# Patient Record
Sex: Female | Born: 1955
Health system: Southern US, Community
[De-identification: ages and names within clinical notes are randomized; demographics above are authoritative.]

## PROBLEM LIST (undated history)

## (undated) DIAGNOSIS — E039 Hypothyroidism, unspecified: Secondary | ICD-10-CM

## (undated) DIAGNOSIS — G43909 Migraine, unspecified, not intractable, without status migrainosus: Secondary | ICD-10-CM

## (undated) DIAGNOSIS — K859 Acute pancreatitis without necrosis or infection, unspecified: Secondary | ICD-10-CM

## (undated) DIAGNOSIS — J45909 Unspecified asthma, uncomplicated: Secondary | ICD-10-CM

## (undated) DIAGNOSIS — G20A1 Parkinson's disease without dyskinesia, without mention of fluctuations: Secondary | ICD-10-CM

## (undated) DIAGNOSIS — R296 Repeated falls: Secondary | ICD-10-CM

## (undated) DIAGNOSIS — E7800 Pure hypercholesterolemia, unspecified: Secondary | ICD-10-CM

## (undated) DIAGNOSIS — K219 Gastro-esophageal reflux disease without esophagitis: Secondary | ICD-10-CM

## (undated) HISTORY — PX: GALLBLADDER SURGERY: SHX652

## (undated) HISTORY — DX: Parkinson's disease without dyskinesia, without mention of fluctuations: G20.A1

## (undated) HISTORY — DX: Unspecified asthma, uncomplicated: J45.909

## (undated) HISTORY — PX: FOOT SURGERY: SHX648

## (undated) HISTORY — PX: ABDOMINAL HYSTERECTOMY: SHX81

## (undated) HISTORY — DX: Gastro-esophageal reflux disease without esophagitis: K21.9

## (undated) HISTORY — DX: Pure hypercholesterolemia, unspecified: E78.00

---

## 1999-06-17 ENCOUNTER — Ambulatory Visit (HOSPITAL_COMMUNITY): Admission: RE | Admit: 1999-06-17 | Discharge: 1999-06-17 | Payer: Self-pay | Admitting: Obstetrics and Gynecology

## 2000-05-24 ENCOUNTER — Inpatient Hospital Stay (HOSPITAL_COMMUNITY): Admission: RE | Admit: 2000-05-24 | Discharge: 2000-05-26 | Payer: Self-pay | Admitting: Obstetrics and Gynecology

## 2000-05-24 ENCOUNTER — Encounter (INDEPENDENT_AMBULATORY_CARE_PROVIDER_SITE_OTHER): Payer: Self-pay | Admitting: Specialist

## 2001-02-03 ENCOUNTER — Encounter: Payer: Self-pay | Admitting: *Deleted

## 2001-02-03 ENCOUNTER — Emergency Department (HOSPITAL_COMMUNITY): Admission: EM | Admit: 2001-02-03 | Discharge: 2001-02-03 | Payer: Self-pay | Admitting: *Deleted

## 2002-06-03 ENCOUNTER — Ambulatory Visit (HOSPITAL_COMMUNITY): Admission: RE | Admit: 2002-06-03 | Discharge: 2002-06-03 | Payer: Self-pay | Admitting: Family Medicine

## 2002-06-03 ENCOUNTER — Encounter: Payer: Self-pay | Admitting: Family Medicine

## 2002-06-06 ENCOUNTER — Encounter (HOSPITAL_COMMUNITY): Admission: RE | Admit: 2002-06-06 | Discharge: 2002-07-06 | Payer: Self-pay | Admitting: Family Medicine

## 2002-06-06 ENCOUNTER — Encounter: Payer: Self-pay | Admitting: Family Medicine

## 2002-06-20 ENCOUNTER — Ambulatory Visit (HOSPITAL_COMMUNITY): Admission: RE | Admit: 2002-06-20 | Discharge: 2002-06-20 | Payer: Self-pay | Admitting: Internal Medicine

## 2002-06-20 ENCOUNTER — Encounter: Payer: Self-pay | Admitting: Internal Medicine

## 2002-08-09 ENCOUNTER — Ambulatory Visit (HOSPITAL_COMMUNITY): Admission: RE | Admit: 2002-08-09 | Discharge: 2002-08-09 | Payer: Self-pay | Admitting: General Surgery

## 2003-01-31 ENCOUNTER — Ambulatory Visit (HOSPITAL_COMMUNITY): Admission: RE | Admit: 2003-01-31 | Discharge: 2003-01-31 | Payer: Self-pay | Admitting: Family Medicine

## 2003-01-31 ENCOUNTER — Encounter: Payer: Self-pay | Admitting: Family Medicine

## 2003-07-29 ENCOUNTER — Ambulatory Visit (HOSPITAL_COMMUNITY): Admission: RE | Admit: 2003-07-29 | Discharge: 2003-07-29 | Payer: Self-pay | Admitting: Family Medicine

## 2004-06-03 ENCOUNTER — Ambulatory Visit (HOSPITAL_COMMUNITY): Admission: RE | Admit: 2004-06-03 | Discharge: 2004-06-03 | Payer: Self-pay | Admitting: Internal Medicine

## 2004-07-07 ENCOUNTER — Ambulatory Visit: Payer: Self-pay | Admitting: Orthopedic Surgery

## 2004-08-02 ENCOUNTER — Ambulatory Visit: Payer: Self-pay | Admitting: Orthopedic Surgery

## 2006-09-12 ENCOUNTER — Ambulatory Visit (HOSPITAL_COMMUNITY): Admission: RE | Admit: 2006-09-12 | Discharge: 2006-09-12 | Payer: Self-pay | Admitting: Family Medicine

## 2007-09-06 DIAGNOSIS — K859 Acute pancreatitis without necrosis or infection, unspecified: Secondary | ICD-10-CM

## 2007-09-06 HISTORY — DX: Acute pancreatitis without necrosis or infection, unspecified: K85.90

## 2007-09-19 ENCOUNTER — Ambulatory Visit (HOSPITAL_COMMUNITY): Admission: RE | Admit: 2007-09-19 | Discharge: 2007-09-19 | Payer: Self-pay | Admitting: Internal Medicine

## 2007-10-25 ENCOUNTER — Inpatient Hospital Stay (HOSPITAL_COMMUNITY): Admission: EM | Admit: 2007-10-25 | Discharge: 2007-11-02 | Payer: Self-pay | Admitting: Emergency Medicine

## 2007-10-26 ENCOUNTER — Ambulatory Visit: Payer: Self-pay | Admitting: Gastroenterology

## 2007-10-29 ENCOUNTER — Ambulatory Visit: Payer: Self-pay | Admitting: Internal Medicine

## 2007-10-31 ENCOUNTER — Ambulatory Visit: Payer: Self-pay | Admitting: Gastroenterology

## 2007-11-09 ENCOUNTER — Emergency Department (HOSPITAL_COMMUNITY): Admission: EM | Admit: 2007-11-09 | Discharge: 2007-11-09 | Payer: Self-pay | Admitting: Emergency Medicine

## 2007-12-05 ENCOUNTER — Ambulatory Visit: Payer: Self-pay | Admitting: Gastroenterology

## 2007-12-06 ENCOUNTER — Ambulatory Visit (HOSPITAL_COMMUNITY): Admission: RE | Admit: 2007-12-06 | Discharge: 2007-12-06 | Payer: Self-pay | Admitting: Gastroenterology

## 2008-02-13 ENCOUNTER — Ambulatory Visit: Payer: Self-pay | Admitting: Gastroenterology

## 2008-08-19 ENCOUNTER — Ambulatory Visit: Payer: Self-pay | Admitting: Internal Medicine

## 2009-03-18 DIAGNOSIS — F329 Major depressive disorder, single episode, unspecified: Secondary | ICD-10-CM

## 2009-03-18 DIAGNOSIS — K8020 Calculus of gallbladder without cholecystitis without obstruction: Secondary | ICD-10-CM | POA: Insufficient documentation

## 2009-03-18 DIAGNOSIS — N390 Urinary tract infection, site not specified: Secondary | ICD-10-CM | POA: Insufficient documentation

## 2009-03-18 DIAGNOSIS — F411 Generalized anxiety disorder: Secondary | ICD-10-CM | POA: Insufficient documentation

## 2009-03-18 DIAGNOSIS — K7689 Other specified diseases of liver: Secondary | ICD-10-CM | POA: Insufficient documentation

## 2009-03-18 DIAGNOSIS — K5900 Constipation, unspecified: Secondary | ICD-10-CM | POA: Insufficient documentation

## 2009-03-18 DIAGNOSIS — K219 Gastro-esophageal reflux disease without esophagitis: Secondary | ICD-10-CM | POA: Insufficient documentation

## 2009-03-18 DIAGNOSIS — K859 Acute pancreatitis without necrosis or infection, unspecified: Secondary | ICD-10-CM | POA: Insufficient documentation

## 2009-03-19 ENCOUNTER — Ambulatory Visit: Payer: Self-pay | Admitting: Gastroenterology

## 2009-10-13 ENCOUNTER — Ambulatory Visit: Payer: Self-pay | Admitting: Gastroenterology

## 2010-01-08 ENCOUNTER — Ambulatory Visit (HOSPITAL_COMMUNITY): Admission: RE | Admit: 2010-01-08 | Discharge: 2010-01-08 | Payer: Self-pay | Admitting: Internal Medicine

## 2010-01-20 ENCOUNTER — Ambulatory Visit (HOSPITAL_COMMUNITY): Admission: RE | Admit: 2010-01-20 | Discharge: 2010-01-20 | Payer: Self-pay | Admitting: Internal Medicine

## 2010-01-27 ENCOUNTER — Ambulatory Visit: Payer: Self-pay | Admitting: Gastroenterology

## 2010-02-04 ENCOUNTER — Encounter: Payer: Self-pay | Admitting: Gastroenterology

## 2010-03-25 ENCOUNTER — Telehealth (INDEPENDENT_AMBULATORY_CARE_PROVIDER_SITE_OTHER): Payer: Self-pay

## 2010-04-14 ENCOUNTER — Telehealth (INDEPENDENT_AMBULATORY_CARE_PROVIDER_SITE_OTHER): Payer: Self-pay

## 2010-05-14 ENCOUNTER — Ambulatory Visit (HOSPITAL_COMMUNITY): Admission: RE | Admit: 2010-05-14 | Discharge: 2010-05-14 | Payer: Self-pay | Admitting: Internal Medicine

## 2010-05-15 ENCOUNTER — Ambulatory Visit (HOSPITAL_COMMUNITY): Admission: RE | Admit: 2010-05-15 | Discharge: 2010-05-15 | Payer: Self-pay | Admitting: Internal Medicine

## 2010-06-24 ENCOUNTER — Encounter (INDEPENDENT_AMBULATORY_CARE_PROVIDER_SITE_OTHER): Payer: Self-pay | Admitting: *Deleted

## 2010-10-05 NOTE — Progress Notes (Signed)
Summary: phone note  Phone Note Call from Patient   Caller: Patient Summary of Call: Pt called and asked if Dr. Darrick Penna could recommend an OTC medication for sinus problems, that will not  cause problems to her pancreas. I told her that we do not prescribe meds for sinus infections, and she  said she just wanted to know what would be safe to take OTC that would not hurt her.  Initial call taken by: Cloria Spring LPN,  April 14, 2010 3:24 PM     Appended Document: phone note Please call pt. She may take OTC Claritin.  Appended Document: phone note Pt informed.

## 2010-10-05 NOTE — Medication Information (Signed)
Summary: PA for dexilant  PA for dexilant   Imported By: Hendricks Limes LPN 51/88/4166 06:30:16  _____________________________________________________________________  External Attachment:    Type:   Image     Comment:   External Document

## 2010-10-05 NOTE — Progress Notes (Signed)
  Phone Note Call from Patient Call back at Home Phone [phone]   Caller: Patient Summary of Call: pt called- left voicemail- she doesnt have the money for visit with her pcp and  she has a sinus infection and wants to know if slf will call in something for her to [facility] please advise Initial call taken by: Hendricks Limes LPN,  March 25, 2010 1:11 PM     Appended Document:  Please call pt. She needs to be seen and evaluated before receiving Abx. She should try the health dept.  Appended Document:  pt aware

## 2010-10-05 NOTE — Letter (Signed)
Summary: Recall Office Visit  [facility]  [address]   [place], [place] [address]   Phone: [phone]  Fax: [phone]      June 24, 2010   [name] [address] [place], [place]  [address] [birth year]   Dear Ms. Hrdlicka,   According to our records, it is time for you to schedule a follow-up office visit with [place].   At your convenience, please call [phone] to schedule an office visit. If you have any questions, concerns, or feel that this letter is in error, we would appreciate your call.   Sincerely,    Rosine Beat  [facility] Ph: [phone]   Fax: [phone]

## 2010-10-05 NOTE — Assessment & Plan Note (Signed)
Summary: GERD,CONSTIPATION   Visit Type:  Follow-up Visit Primary Care Provider:  Sherwood Gambler, M.D.  Chief Complaint:  Gerd/constipation.  History of Present Illness: Had a know on a mammogram. Several people prayed and the second mammogram. No questions or concerns. Needs Nexium samples. BMs: 1-2x/day. Heartburn and indigestion is controlled. Takes omeprazole if out of Nexium beacuse it's expensive.  Current Medications (verified): 1)  Tylenol Ex St Arthritis Pain 500 Mg Tabs (Acetaminophen) .... As Needed 2)  Tylenol 325 Mg Tabs (Acetaminophen) .... As Needed 3)  Xanax 1 Mg Tabs (Alprazolam) .... Four Times A Day As Needed 4)  Fiber Therapy Caplets .... As Needed 5)  Lasix 20 Mg Tabs (Furosemide) .... Once Daily 6)  Zoloft 100 Mg Tabs (Sertraline Hcl) .... Once Daily 7)  Allopurinol 300 Mg Tabs (Allopurinol) .... Once Daily 8)  Celexa 20 Mg Tabs (Citalopram Hydrobromide) .... Once Daily 9)  Diclofenac Sodium 50 Mg Tbec (Diclofenac Sodium) .Marland Kitchen.. 1 By Mouth Q6-8h As Needed For Neck, Arm, or Back Pain 10)  Omeprazole 20 Mg Cpdr (Omeprazole) .... Take 1 Tablet By Mouth Once A Day 11)  Pravachol 40 Mg Tabs (Pravastatin Sodium) .... Take 1 Tablet By Mouth Once A Day 12)  Niacin 50 Mg Tabs (Niacin) .... Take 1 Tablet By Mouth Once A Day 13)  Fioricet 50-325-40 Mg Tabs (Butalbital-Apap-Caffeine) .... As Needed 14)  Neurontin 100 Mg Caps (Gabapentin) .... Two Capsules Three Times Daily 15)  Vicodin 5-500 Mg Tabs (Hydrocodone-Acetaminophen) .... As Needed  Allergies (verified): 1)  ! Sulfa 2)  ! Penicillin 3)  ! Tetracycline  Past History:  Past Medical History: Pancreatitis with pseudocyst formation, idiopathic 2009 Anxiety Disorder Depression Fatty Liver Disease GERD Urinary Tract Infection 2009 Acute Disseminated Encephalomyelitis 2009 Chronic Constipation 2008: TCS [facility] NUR-WNLS  Vital Signs:  Patient profile:   55 year old female Height:      66 inches Weight:      164  pounds BMI:     26.57 Temp:     98.8 degrees F oral Pulse rate:   84 / minute BP sitting:   110 / 80  (left arm) Cuff size:   regular  Vitals Entered By: Cloria Spring LPN (Jan 27, 2010 4:15 PM)  Physical Exam  General:  Well developed, well nourished, no acute distress. Head:  Normocephalic and atraumatic. Lungs:  Clear throughout to auscultation. Heart:  Regular rate and rhythm; no murmurs. Abdomen:  Soft, MILD TTP R PERIUMBILICAL REGION, nondistended. Normal bowel sounds. Extremities:  No edema or deformities noted. BREAST CANCER TATTOO on RLE  Impression & Recommendations:  Problem # 1:  GERD (ICD-530.81) Assessment Improved  Unable to afford Nexium, #30 given. Pt may try Dexilant for better Sx control and less expense. #5 samples and Rx copay card given. OPV in 6 mos.  Orders: Est. Patient Level II ([address])  Problem # 2:  CONSTIPATION (ICD-564.00) Assessment: Comment Only  Continue current regimen.   CC: PCP  Orders: Est. Patient Level II (60454) Prescriptions: DEXILANT 60 MG CPDR (DEXLANSOPRAZOLE) 1 by mouth every morning  #30 x 5   Entered and Authorized by:   West Bali MD   Signed by:   West Bali MD on 01/27/2010   Method used:   Electronically to        [facility]  [address] (retail)       [address]       [place], [place]  [id number]       Ph: [phone]       Fax: [phone]   RxID:   [id number]   Appended Document: GERD,CONSTIPATION REMINDER IN COMPUTER

## 2010-10-05 NOTE — Assessment & Plan Note (Signed)
Summary: GERD,CONSTIPATION   Visit Type:  Follow-up Visit Primary Care Provider:  Phillips Odor, M.D.  Chief Complaint:  follow up- doing ok.  History of Present Illness: Keeping a sinus ifxn. Last week had virus and diarrhea. Has a BM daily and happening on its own.  Fiber pills rarely. Nexium helps. It is expensive ( ~$103/mo.) so use OMP if doesn't use Nexium. OMP only once a day doesn't help as well as Nexium once daily.  Current Medications (verified): 1)  Albuterol Sulfate 2 Mg Tabs (Albuterol Sulfate) .... As Needed 2)  Tylenol Ex St Arthritis Pain 500 Mg Tabs (Acetaminophen) .... As Needed 3)  Tylenol 325 Mg Tabs (Acetaminophen) .... As Needed 4)  Xanax 1 Mg Tabs (Alprazolam) .... Four Times A Day As Needed 5)  Ferrousul 325 (65 Fe) Mg Tabs (Ferrous Sulfate) .... Two Times A Day 6)  Fiber Therapy Caplets .... As Needed 7)  Lasix 20 Mg Tabs (Furosemide) .... Once Daily 8)  Zoloft 100 Mg Tabs (Sertraline Hcl) .... Once Daily 9)  Allopurinol 300 Mg Tabs (Allopurinol) .... Once Daily 10)  Nexium 20 Mg Cpdr (Esomeprazole Magnesium) .... Once Daily 11)  Celexa 20 Mg Tabs (Citalopram Hydrobromide) .... Once Daily  Allergies (verified): 1)  ! Sulfa 2)  ! Penicillin 3)  ! Tetracycline  Past History:  Past Medical History: Pancreatitis with pseudocyst formation, idiopathic 2009 Anxiety Disorder Depression Fatty Liver Disease GERD Urinary Tract Infection 2009 Acute Disseminated Encephalomyelitis 2009 Chronic Constipation 2008: [facility] NUR-[facility]  Vital Signs:  Patient profile:   55 year old female Height:      66 inches Weight:      170 pounds BMI:     27.54 Temp:     98.1 degrees F oral Pulse rate:   60 / minute BP sitting:   104 / 60  (left arm) Cuff size:   regular  Vitals Entered By: Hendricks Limes LPN (October 13, 2009 4:24 PM)  Physical Exam  General:  Well developed, well nourished, no acute distress. Head:  Normocephalic and atraumatic. Lungs:  Clear throughout to  auscultation. Heart:  Regular rate and rhythm; no murmurs Abdomen:  Soft, nontender and nondistended. Normal bowel sounds. Psych:  Alert and cooperative. Normal mood and affect.  Impression & Recommendations:  Problem # 1:  CONSTIPATION (ICD-564.00) Assessment Improved NO NEW RECS. TCS IN 2018. OPV IN 3 MOS.  Problem # 2:  GERD (ICD-530.81) Assessment: Improved  USE Nexium or OMP 1-2x daily. Use Diclofenac. Med warning given: GIB, and fluid retention. Nexium #20 given.  CC: PCP  Orders: Est. Patient Level III (09811)  Patient Instructions: 1)  Take Dicolfenac with food or milk. May cause you to retain fluid. 2)  Use Nexium or omeprazole GERD 3)  Return visit in 3 mos. 4)  The medication list was reviewed and reconciled.  All changed / newly prescribed medications were explained.  A complete medication list was provided to the patient / caregiver. Prescriptions: DICLOFENAC SODIUM 50 MG TBEC (DICLOFENAC SODIUM) 1 by mouth q6-8h as needed for neck, arm, or back pain  #30 x 5   Entered and Authorized by:   West Bali MD   Signed by:   West Bali MD on 10/13/2009   Method used:   Print then Give to Patient   RxID:   [id number]

## 2011-01-18 NOTE — Consult Note (Signed)
NAME:  [name], [name]             ACCOUNT NO.:  [id number]   MEDICAL RECORD NO.:  [id number]          PATIENT TYPE:  INP   LOCATION:  A207                          FACILITY:  [facility]   PHYSICIAN:  Kofi A. Gerilyn Pilgrim, M.D. DATE OF BIRTH:  10-23-1955   DATE OF CONSULTATION:  10/30/2007  DATE OF DISCHARGE:                                 CONSULTATION   REASON FOR CONSULTATION:  Altered mental status.   HISTORY OF PRESENT ILLNESS:  The patient is a 55 year old lady who  presents to the hospital with generalized weakness.  The patient had a  significant history of nausea and vomiting.  The patient is in the  process of being worked up for pancreatitis and urinary tract infection.  She was noted to have some alteration of mentation and hence the  neurological consultation.  She does have apparently a long history of  episodic headaches that are sometimes associated with nausea and  vomiting.  The patient reports apparently losing about 3 pounds over the  last several weeks due to nausea and vomiting.  She also has significant  epigastric pain.   PAST MEDICAL HISTORY:  Episodic headaches, pancreatitis, anxiety  disorder, depression, and respiratory distress.   PAST SURGICAL HISTORY:  Carpal tunnel release procedure, hysterectomy,  cholecystectomy, and endometriosis ablation.   SOCIAL HISTORY:  She is a nonsmoker.  No alcohol or illicit drug use.  She is currently married.   ALLERGIES:  TETRACYCLINE, PENICILLIN, and SULFA.   ADMISSION MEDICATIONS:  1. Bupropion 100 mg b.i.d.  2. Clonazepam 2 mg p.r.n.  3. Omeprazole 20 mg p.r.n.  4. Celexa 20 mg daily.  5. Albuterol.   CURRENT MEDICATIONS:  1. Celexa.  2. Wellbutrin.  3. Lovenox.  4. Protonix.  5. Amylase combination with lipase.  6. Clonazepam.  7. Phenergan p.r.n.  8. Xopenex p.r.n.   PHYSICAL EXAMINATION:  GENERAL:  A thin lady in no acute distress.  VITAL SIGNS:  Temperature 97.8, pulse 100, respirations 18, blood  pressure 112/60.  HEENT:  Neck is supple.  Head is normocephalic and atraumatic.  ABDOMEN:  Soft.  EXTREMITIES:  No significant edema.  NEUROLOGY:  The patient is awake and alert.  She converses well.  She is  oriented to person and place.  She is oriented to hospital.  She does  not recognize me although I have seen her in the past in the outpatient  setting.  She follows commands well and there is no difficulty with  comprehension or aphasia.  CRANIAL NERVES:  Pupils are 5 mm and reactive.  They both are brisk.  She has normal extraocular movements.  Visual fields are intact.  Facial  muscle strength is symmetric.  Tongue is midline.  Uvula is midline.  Shoulder shrug is normal.  MOTOR:  Mild weakness involving the left leg, but 4+.  The other  extremities show normal tone, bulk, and strength.  I see no pronator  drift.  Coordination shows normal finger-to-nose.  There is no evidence  of dysmetria or past pointing.  There is no parkinsonism.  Reflexes are  pathologically brisk throughout 3+, although plantar  reflexes are both  flexor.  Sensation is normal light touch and temperature.  Gait was  wide, somewhat unsteady, but is not spastic.   LABORATORY DATA:  The patient had a CT scan done about a month ago which  essentially was normal.  She has a repeat scan which appears normal, but  by my reading.  The radiologist raised the question of possible  increased edema and suggested MRI may be better.   Sodium 139, potassium 3.5, chloride 109, CO2 26, BUN 1, creatinine 0.5,  glucose 94, calcium 8.2.  WBC 5.4, hemoglobin 8.9, platelet count 164.  Urinalysis positive for nitrites, turbid, WBC 28-50, RBC 0-2, many  bacteria.  Culture did grow out Proteus mirabilis.  Blood culture is  negative.   IMPRESSION:  1. Mild encephalopathy likely due to toxic metabolic reasons from      urinary tract infection.  2. Episodic headaches likely due to primary headaches such as      migraines.  3.  Questionable abnormal CT findings.  4. Brisk reflexes, unclear etiology.  We will observe for now.   RECOMMENDATIONS:  1. MRI of the brain to assess the potential findings on CT.  2. Additional blood testing for the following:  RPR, TSH, homocysteine      level, B12 level, ANA, and sed rate.   Thanks for this consultation.      Kofi A. Gerilyn Pilgrim, M.D.  Electronically Signed     KAD/MEDQ  D:  10/30/2007  T:  10/30/2007  Job:  [id number]

## 2011-01-18 NOTE — Group Therapy Note (Signed)
[name], [name]             ACCOUNT NO.:  [id number]   MEDICAL RECORD NO.:  [id number]          PATIENT TYPE:  INP   LOCATION:  A207                          FACILITY:  [facility]   PHYSICIAN:  Skeet Latch, DO    DATE OF BIRTH:  11/30/1955   DATE OF PROCEDURE:  10/28/2007  DATE OF DISCHARGE:                                 PROGRESS NOTE   SUBJECTIVE:  [name] states her abdominal pain continues to improve  as well as her nausea and vomiting.  Overall, the patient states that  she wants to go home.   OBJECTIVE:  VITAL SIGNS:  Temperature is 97.5, pulse 93, respirations  22, blood pressure 122/78.  CARDIOVASCULAR:  Regular rate and rhythm.  No rubs, gallops, or murmurs.  LUNGS:  Clear to auscultation bilaterally.  No rales, rhonchi, or  wheezing.  ABDOMEN:  Soft, nontender, nondistended.  Positive bowel sounds.  No  rigidity or guarding.  EXTREMITIES:  No clubbing, cyanosis, or edema.  NEUROLOGIC:  The patient's affect seems to be a little bit improved  today.  She is alert and oriented and awake.   LABORATORY:  Fecal occult negative.  Lipase is 82.  Sodium 145,  potassium 3.1, chloride is 101, CO2 is 27, glucose 101, BUN 1,  creatinine 0.68.  AST 16, ALT 10.  Total protein 5.8.  Albumin 2.6.  White count 6.4, hemoglobin 9.7, hematocrit 28.8, platelets 181.   ASSESSMENT/PLAN:  1. Probable pyelonephritis.  The patient continues to be on      intravenous antibiotics and at this point we will decrease her      intravenous rate.  2. History of pancreatitis.  Lipase is now in a normal range.  She      does have peripancreatic cystic fluid collection on her computed      tomography scan.  3. Anemia, probably dilutional.  We will continue to check her      hemoglobin and hematocrit.  Her hemoccult stool has been negative.  4. Hypokalemia.  We will continue to monitor and continue to replace.   I anticipate the patient being discharged in the next 1-2 days if   stable.      Skeet Latch, DO  Electronically Signed     SM/MEDQ  D:  10/28/2007  T:  10/28/2007  Job:  [id number]

## 2011-01-18 NOTE — Consult Note (Signed)
NAMEMILIYAH, [name]             ACCOUNT NO.:  [id number]   MEDICAL RECORD NO.:  [id number]          PATIENT TYPE:  INP   LOCATION:  A207                          FACILITY:  [facility]   PHYSICIAN:  Kassie [name], M.D.      DATE OF BIRTH:  1955-10-12   DATE OF CONSULTATION:  10/25/2007  DATE OF DISCHARGE:                                 CONSULTATION   REFERRING PHYSICIAN:  Dorris Singh, DO   DATE OF CONSULTATION:  10/26/07   REASON FOR CONSULTATION:  Abdominal pain.   HISTORY OF PRESENT ILLNESS:  Ms. Mcinroy is a 55 year old female who was  reportedly hospitalized at [facility] for 9 days with  pancreatitis.  History is limited because the patient is a poor  historian. She was discharged Valentine's day weekend.  She states she  continued to vomit and have abdominal pain.  She was also having  frequent urination and burning with urination.  She denied any blood in  her urine.  She denied any diarrhea or constipation.  She not have any  problems swallowing.  She reports having heartburn daily.  Her last  episode of vomiting was this morning, and she had no blood.  She did not  consume any alcohol.   PAST MEDICAL HISTORY:  1. Anxiety.  2. Depression.  3. Gastroesophageal reflux disease.  4. Fatty liver disease.   PAST SURGICAL HISTORY:  1..  Cholecystectomy in 2003 due to biliary  colic.  1. Hysterectomy with bilateral salpingo-oophorectomy.   ALLERGIES:  PENICILLIN, SULFA AND CLINDAMYCIN.   MEDICATIONS:  1. Wellbutrin.  2. Celexa  3. Colace  4. Lovenox.  5. Levaquin.  6. Protonix 40 mg q.12h.  7. Clonazepam as needed.  8. Xopenex.   FAMILY HISTORY:  She denies any family history of colon cancer or colon  polyps.   SOCIAL HISTORY:  She is married and does not smoke.  She is disabled  secondary to nerves.   REVIEW OF SYSTEMS:  She had a CT ultrasound and HIDA scan in 2003 which  were unremarkable except for constipation.  She reports having  colonoscopy  last year by Dr. Karilyn Cota.   REVIEW OF SYSTEMS:  Per the HPI, otherwise all systems are negative.  The HPI and review of systems is limited because the patient is a poor  historian.   PHYSICAL EXAMINATION:  VITAL SIGNS:  T-max 98.9, blood pressure 125/75,  O2 saturation 95% on room air.  GENERAL:  She is in no apparent distress, alert and oriented x4.  She  makes fairly poor eye contact.  HEENT:  Atraumatic, normocephalic.  Flat affect.  Pupils equal and  reactive to light.  Mouth with no oral lesions.  NECK:  Full range of motion.  No lymphadenopathy.  LUNGS:  Clear to auscultation bilaterally.  CARDIOVASCULAR:  Regular rhythm, no murmur.  ABDOMEN:  Bowel sounds are present.  Soft.  Mild tenderness to palpation  in the left upper quadrant without rebound or guarding.  EXTREMITIES:  No cyanosis or edema.  NEUROLOGIC:  She has no focal neurologic deficits.   LABORATORY DATA:  Hemoglobin was  12.7 on admission and decreased to 9.6,  white count 6.7.  Lipase 144, total bilirubin 1.7, AST 26, ALT 13,  albumin 3.5, creatinine 0.77.  UA was nitrite-positive, leukocytes  large, WBCs 20-50, bacteria many, with few squamous epithelial cells.   ASSESSMENT:  Ms. Carolan is a 55 year old female whose abdominal pain  and vomiting is most likely secondary to pyelonephritis.  She has a  mildly elevated lipase which is lot clinically significant.  However,  she has had an ultrasound which shows a peripancreatic cystic fluid  collection.  The differential diagnosis includes cystic mass versus  peripancreatic fluid collection secondary to prior pancreatitis.  Thank  you allowing me to see Ms. Ann Maki in consultation.  My recommendations  follow. She has a decrease in her hemoglobin not associated with active  GI bleeding.   RECOMMENDATIONS:  1. Obtain records with [facility].  2. She has no evidence of active bleeding, but would recheck her      hemoglobin and hematocrit as well as a type and  screen around 1800      today.  3. Will await the results of the CT scan.  4. She has no acute indication for endoscopy.      Kassie [name], M.D.  Electronically Signed     SM/MEDQ  D:  10/26/2007  T:  10/26/2007  Job:  [id number]

## 2011-01-18 NOTE — Assessment & Plan Note (Signed)
NAME:  [name], [name]              CHART#:  [id number]   DATE:  02/13/2008                       DOB:  [birth year]   PROBLEM LIST:  1. Pancreatitis with pseudocyst formation, unknown etiology for      pancreatitis.  2. Cholecystectomy in 2003, for biliary colic.  3. Anxiety.  4. Depression.  5. Gastroesophageal reflux disease.  6. Fatty liver disease.  7. Hospitalization for acute disseminated encephalomyelitis in      February 2009.  8. Urinary tract infection in February 2009.   SUBJECTIVE:  The patient is a 55 year old female who presents as a  return patient visit.  She was last seen in April 2009.  She was  complaining of constipation.  She denies any abdominal pain.  Her weight  has been stable.  She recently had a kidney infection.  She is still  taking omeprazole twice daily.  She feels she can tolerate once a day.  She is having 1-2 bowel movements daily, after adding Nutri-Grain Raisin  Bran.  She stopped taking the Citracal, and she has not had to use any  milk of magnesia.   MEDICATIONS:  1. Omeprazole 20 mg b.i.d.  2. Albuterol t.i.d.  3. Clonapam 2 mg t.i.d.  4. Bupropion t.i.d.  5. Citalopram 2 daily.  6. Tylenol as needed.  7. Tylenol Arthritis as needed  8. Zyrtec as needed   SUBJECTIVE:  VITAL SIGNS:  Weight 146 pounds, height 5 feet 6 inches,  temperature 98.9, blood pressure 110/78, and pulse 84.  GENERAL:  She is in no apparent distress.  Alert and oriented x4.LUNGS:  Clear to auscultation bilaterally.CARDIOVASCULAR:  Regular rate and  rhythm.  No murmur.  ABDOMEN:  Bowel sounds present.  Soft, nondistended, and mild tenderness  to palpation in the left lower quadrant.   ASSESSMENT:  The patient is a 55 year old female who had pancreatitis  with pseudocyst formation, which has now resolved.  She has  constipation, which has now resolved with fiber and water. Thank you  allowing me to see patient in consultation.  My recommendations follow.   RECOMMENDATIONS:  1. She should continue her high-fiber diet.  2. She may decrease her omeprazole to once daily.  3. She has no indication for a CT scan, since she is clinically      asymptomatic.  4. Follow up in 6 months.       Kassie [name], M.D.  Electronically Signed     SM/MEDQ  D:  02/13/2008  T:  02/14/2008  Job:  [id number]   cc:   Madelin Rear. Sherwood Gambler, MD

## 2011-01-18 NOTE — Discharge Summary (Signed)
NAMEHARMAN, [name]             ACCOUNT NO.:  [id number]   MEDICAL RECORD NO.:  [id number]          PATIENT TYPE:  INP   LOCATION:  A207                          FACILITY:  [facility]   PHYSICIAN:  Osvaldo Shipper, MD     DATE OF BIRTH:  1956/01/02   DATE OF ADMISSION:  10/25/2007  DATE OF DISCHARGE:  02/27/2009LH                               DISCHARGE SUMMARY   Please review H&P dictated by Dr. Dorris Singh for details regarding  the patient's presenting illness.   DISCHARGE DIAGNOSES:  1. Acute disseminated encephalomyelitis.  2. Pancreatitis with pseudocyst formation.  3. Urinary tract infection.  4. Anemia, possibly iron deficiency.  5. Hypokalemia and hypomagnesemia, improved.  6. History of depression.   BRIEF HOSPITAL COURSE:  PROBLEM #1 - PANCREATITIS:  This is a 55-year-  old Caucasian female who presented to the hospital with history of  nausea, vomiting and feeling weak.  The patient was evaluated in the  emergency department, where she was found to be severely hypokalemic  with a potassium of 2.5.  She had a lipase of 141 and she was found to  have a UTI.  The patient underwent abdominal ultrasound initially which  showed heterogeneous structure obscuring a portion of the pancreas and a  CT was recommended.  Subsequently, a CT of the abdomen was done, which  showed evidence for acute pancreatitis with multiple pseudocysts  involving the head and neck of the pancreas.  CT of the pelvis was done  subsequently, which did not show any acute process.  GI was consulted,  who recommended Creon 10 tablets for her; they also recommended  following up on CAT scan in about month's time and followup with GI.  Then her lipase levels were also followed and they improved and the last  checked level was 70.   PROBLEM #2 - ALTERED MENTAL STATUS:  Continued in the hospital; this  prompted a CAT scan of head which showed evidence for progressive white  matter disease and question was  raised about encephalitis, so an MRI was  done which showed diffuse profound abnormal cerebral white matter.  Changes were thought to be secondary to some kind of encephalitis.  Dr.  Gerilyn Pilgrim was consulted, who felt this was acute disseminated  encephalomyelitis.  He recommended 3 days of high-dose steroids, which  were given and today is the last day.  The patient's mental status has  improved during this hospital stay and she appears to be more alert and  oriented.   PROBLEM #3 - URINARY TRACT INFECTION:  She also was found to have a UTI  which grew E. coli and she was put on Levaquin and she has improved.   PROBLEM #4 - She also has hypokalemia and hypomagnesemia which were also  replaced.  She will be prescribed potassium chloride and magnesium oxide  to be taken at home.  Repeat lab tests will be done as an outpatient to  make sure her potassium levels have normalized.   PROBLEM #5 - She also was found to be anemic with a hemoglobin between 9  and 10.  Iron  profile studies were done yesterday which showed iron of  24, TIBC of 238, ferritin was 62 and percent saturation was 10%.  This  could be iron deficiency, although it is not very conclusive; however, I  will give her the benefit of doubt and start her on Nu-Iron, which she  can take at home.   On the day of discharge, the patient was noticed to be ambulating  somewhat with physical therapist.  Therapy has recommended a walker for  her.  She is complaining of some lower back pain which she has had for a  while; otherwise, she does not have any complaints to offer.  Her vital  signs are all stable, examination otherwise unremarkable.  She has  improved.  She is still a bit confused at times, but I think this can be  monitored at home.  She will need close followup with her PMD and with  GI and if her PMD thinks she needs further followup with Neurology, they  can definitely refer her for that.   DISCHARGE MEDICATIONS:  1.  Bupropion 100 mg twice daily.  2. Clonazepam 2 mg as needed.  3. Omeprazole 20 mg b.i.d.  4. Celexa 20 mg once a day.  5. She is also on Mobic at home, 7.5 mg, that she can take on a once      daily basis.   NEW MEDICATIONS:  1. Nu-Iron 150 mg p.o. daily for 1 week and then b.i.d.  2. Creon 10 two capsules 3 times daily with meals.  3. Levaquin 500 mg once daily for 3 days.  4. Potassium chloride 40 mEq p.o. daily.  5. Magnesium oxide 400 mg once daily.  6. Colace 100 mg p.o. b.i.d.   OTHER FOLLOWUP:  BMET to be checked next week by home health with the  results to Dr. Sherwood Gambler, H&H to be checked twice a week, results to the GI  folks, Dr. Cira Servant; this was their recommendation.   Follow up with Dr. Sherwood Gambler in 1 week, Dr. Cira Servant in 6 weeks, Dr. Sherwood Gambler to  decide if the patient needs to be followed up by Dr. Gerilyn Pilgrim as well or  not.   The patient may need to be seen by a mental health physician as well,  which I will defer to Dr. Sherwood Gambler.   DIET:  She can have a heart-healthy diet.   PHYSICAL ACTIVITY:  She needs to use a walker at home, which she does  have.   CONSULTATIONS:  Obtained from:  1. GI.  2. Neurology.   TOTAL TIME SPENT ON THIS DISCHARGE ENCOUNTER:  Forty minutes.   COMMENT:  Please note above is preliminary until signed.   ADDENDUM: Patient was discharged home after social worker and self  discussed home safety etc with her husband.      Osvaldo Shipper, MD  Electronically Signed     GK/MEDQ  D:  11/02/2007  T:  11/02/2007  Job:  [id number]   cc:   Madelin Rear. Sherwood Gambler, MD  Fax: [phone]   Kofi A. Gerilyn Pilgrim, M.D.  Fax: [phone]   Kassie [name], M.D.  [address]  [place] , [place] [address]

## 2011-01-18 NOTE — Assessment & Plan Note (Signed)
NAME:  [name], [name]              CHART#:  [id number]   DATE:  08/19/2008                       DOB:  1956-08-01   CHIEF COMPLAINT:  Followup visit.   PROBLEM LIST:  1. Pancreatitis with pseudocyst formation, unknown etiology for      pancreatitis.  2. Cholecystectomy in 2003, for biliary colic.  3. Anxiety.  4. Depression.  5. GERD.  6. Fatty liver disease.  7. Hospitalization for acute disseminated encephalomyelitis in      February 2009.  8. Urinary tract infection in February 2009.   SUBJECTIVE:  The patient is here for a followup visit.  She was last  seen in June 2009, by Dr. Cira Servant.  Other than constipation, she has been  doing very well.  Generally, her constipation is controlled with fiber  supplement.  Recently, she had multiple episodes of diarrhea after  eating out at a restaurant.  She took 2 Imodium and now she is having  trouble getting back on a regular bowel regimen.  She denies any blood  in the stool or melena.  Denies any abdominal pain.  Her appetite is  great.  No nausea, vomiting, or heartburn.   MEDICATIONS:  1. Omeprazole 20 mg b.i.d.  2. Mobic 7.5 mg daily.  3. Albuterol p.r.n.  4. Citalopram 20 mg twice daily.  5. Tylenol p.r.n.  6. Tylenol Arthritis p.r.n.  7. Xanax 1 mg q.i.d. p.r.n.  8. Ferrous sulfate 325 mg b.i.d.  9. Fiber therapy caplets p.r.n.   ALLERGIES:  Sulfa, penicillin, and tetracycline.   PHYSICAL EXAMINATION:  VITAL SIGNS:  Weight 164 pounds, up from 147  pounds June of this year.  Temp 97.7, blood pressure 130/78, and pulse  60.  GENERAL:  A pleasant, well-nourished, well-developed Caucasian female in  no acute distress.  SKIN:  Warm and dry.  No jaundice.  HEENT:  Sclerae nonicteric.  Oropharyngeal mucosa moist and pink.  ABDOMEN:  Positive bowel sounds.  Soft, nontender, and nondistended.  No  organomegaly or masses.  No rebound or guarding. No abdominal bruits or  hernia.  LOWER EXTREMITIES:  No edema.   IMPRESSION:   The patient is a 55 year old lady with history of  pancreatitis with pseudocyst formation, which is now resolved.  She had  a total of 2 CTs for followup, the last one was on December 06, 2007, which  showed interval improvement, multiple pancreatic pseudocyst, and  improvement in the pancreatic edema.  Per Dr. Cira Servant, no further workup  needed.  Although, it is not clear why she developed the pancreatitis.  She also has chronic constipation, generally does well with fiber  supplementation.   PLAN:  1. Continue fiber supplements.  2. For the next 3 days, should take MiraLax 17 g b.i.d. to facilitate      more regular bowel movements, status post Imodium use, #6, samples      provided.  3. Continue omeprazole.  4. Office visit in 6 months with Dr. Cira Servant.       Tana Coast, P.A.  Electronically Signed     Kassie [name], M.D.  Electronically Signed    LL/MEDQ  D:  08/19/2008  T:  08/19/2008  Job:  [id number]   cc:   Corrie Mckusick, M.D.

## 2011-01-18 NOTE — H&P (Signed)
NAMENEETU, CARROZZA             ACCOUNT NO.:  [id number]   MEDICAL RECORD NO.:  [id number]          PATIENT TYPE:  INP   LOCATION:  A207                          FACILITY:  [facility]   PHYSICIAN:  Dorris Singh, DO    DATE OF BIRTH:  December 24, 1955   DATE OF ADMISSION:  10/25/2007  DATE OF DISCHARGE:  LH                              HISTORY & PHYSICAL   HISTORY OF PRESENT ILLNESS:  The patient is a 55 year old woman who  presented to [facility] emergency room with a pretty extensive history  of nausea and vomiting.  The patient's husband gives a history due to  patient's not feeling weak and not responding to questions  appropriately.  The patient's husband states that this ordeal for them  started approximately 3 weeks when his wife was visiting a friend in  [place], [place].  Her friend, who apparently used to be an  employee here at [employer] [employer], found the patient to be unresponsive and  she had stopped breathing, and she went ahead and performed mouth-to-  mouth on her and called EMS.  She was then transferred to [facility] in [place] where the husband reports that she was intubated  for up to 48 hours and then extubated at that point in time for  respiratory distress.  He mentioned something about her CO2 level being  too high but did not have any other explanation of why she was intubated  for this episode.  Also, he states that she was diagnosed with  pancreatitis while she was there as well, and she was then discharged  approximately 3 weeks ago.  Since her discharge and her return to back  to this region, she has been complaining of nausea and vomiting and has  not eaten.  Per husband, she has lost up to 30 pounds since her return.  She typically is 180 pounds and today for this hospital stay she is  about 150 pounds currently.  He states that he had noticed that since  her discharge from the hospital as well she has been doing odd things,  things that do  not seem like her self, and he is also concerned about  that too.  She is very weak.  He was unable to get her to the car today  to take her to an appointment to see Dr. Sherwood Gambler, and they went ahead and  brought her to [facility] for evaluation.  The nausea and vomiting had  started after her discharge.  There has been nothing that made it better  or anything that made it worse, it has gone unchanged.  Also, there was  a complaint of abdominal pain and dysuria and weakness from the patient,  as well, and she is also complaining of epigastric pain that bores  through to her back.   PAST MEDICAL HISTORY:  1. Anxiety.  2. Depression.  3. Pancreatitis.  4. Respiratory distress.   PAST SURGICAL HISTORY:  1. Endometriosis ablation.  2. Carpal tunnel.  3. Cholecystectomy.  4. Hysterectomy.   SOCIAL HISTORY:  She is a nonsmoker, nondrinker and  denies any drug  abuse.  She is currently married.  She has several allergies to sulfa,  penicillin, tetracycline.   MEDICATIONS:  1. Bupropion 100 mg b.i.d.  2. Clonazepam 2 mg as needed.  3. Omeprazole 20 mg as needed.  4. Celexa 20 mg once a day.  5. Albuterol sulfate 2 mg three times.   REVIEW OF SYSTEMS:  CONSTITUTIONAL:  Positive for weakness and fatigue.  Weight loss.  EYES:  Negative for any changes in seeing or double  vision.  EARS, NOSE, MOUTH AND THROAT:  Negative for hearing or smelling  or swallowing problems.  CARDIOVASCULAR:  Negative for palpitations.  RESPIRATORY:  Negative for wheezing.  GASTROINTESTINAL:  Positive for  nausea, vomiting, diarrhea.  GU: Positive for dysuria, urgency.  MUSCULOSKELETAL:  Positive for arthralgias and myalgias.  SKIN:  Negative for pruritus or abrasions.  NEUROLOGICAL:  Positive for  disorientation.  PSYCHIATRIC:  Positive for depression.   PHYSICAL EXAMINATION:  VITAL SIGNS:  Temperature is 98.9, pulse 107,  respirations 20, blood pressure 115/70.  GENERAL:  The patient is a well-developed,  well-nourished Caucasian  female who looks her stated age.  Head is normocephalic, atraumatic.  Eyes are PERRL.  EOMI.  No scleral icterus or conjunctival injection.  Ears: TMIs visualized bilaterally.  Nose: Turbinates are moist.  Throat:  No exudate or erythema.  Teeth are in good repair.  NECK:  Supple.  No lymphadenopathy.  HEART:  Regular rate and rhythm.  LUNGS: Clear to auscultation bilaterally.  ABDOMEN: Generalized tenderness particularly in the mid epigastric  region.  EXTREMITIES:  Positive pulses.  No ecchymosis or edema noted.  NEUROLOGICAL:  Cranial nerves II-XII grossly intact.  SKIN:  Cool and dry.  The patient is a A&O x3 but with a flat affect.   LABORATORY DATA:  Lipase 141, magnesium 1.9.  Chemistry:  Sodium was  139, potassium 2.5, chloride 95, carbon dioxide 25, glucose 106, BUN 18  and creatinine 0.86.  Her CBC is within normal limits.   ASSESSMENT AND PLAN:  Epigastric pain, vomiting, pancreatitis,  hypokalemia and UTI.   PLAN:  Will admit the patient service of [facility].  Will make her  n.p.o.  Will start fluids at 125 ml per hour.  Will go ahead and replace  her potassium will put her on DVT and GI prophylaxis will have a GI to  consult.  Will place the patient on home medications and will obtain  records from [facility] in [place], [place].      Dorris Singh, DO  Electronically Signed     CB/MEDQ  D:  10/25/2007  T:  10/26/2007  Job:  [id number]

## 2011-01-18 NOTE — Group Therapy Note (Signed)
[name], [name]             ACCOUNT NO.:  [id number]   MEDICAL RECORD NO.:  [id number]          PATIENT TYPE:  INP   LOCATION:  A207                          FACILITY:  [facility]   PHYSICIAN:  Skeet Latch, DO    DATE OF BIRTH:  01-28-1956   DATE OF PROCEDURE:  10/26/2007  DATE OF DISCHARGE:                                 PROGRESS NOTE   SUBJECTIVE:  [name] states that her nausea and vomiting is  improved.  Patient denies any abdominal discomfort at this time.  Her  affect seems flat, otherwise patient is doing well.   OBJECTIVE:  VITAL SIGNS:  Temperature 97.9, respirations 22, heart rate  97, blood pressure 125/75, she is sating 95% on room air.  GENERAL APPEARANCE:  Patient is awake and alert, cooperative, in no  acute distress.  LUNGS:  Clear to auscultation bilaterally.  No wheezing, rhonchi or  rales.  CARDIOVASCULAR:  Regular rate and rhythm, no murmurs, rubs, or gallops.  ABDOMEN:  Slightly tender in the epigastric area, no rigidity or  guarding.  Positive bowel sounds.  EXTREMITIES:  No clubbing, cyanosis, or edema.  NEUROLOGIC:  Patient is awake and alert.  She has a flat affect on exam  but otherwise she is in no acute distress.   LABORATORY DATA:  Hemoglobin 9.6, hematocrit 28.6, white count 6.7,  platelets 215.  Sodium 141, potassium 2.5, chloride 103, CO2 31, BUN 5,  creatinine 0.77, glucose 99.  Amylase 92, lipase 144, calcium 8.8,  magnesium 1.7, phosphorus 3.7.   ASSESSMENT/PLAN:  1. Epigastric pain, could be secondary to acute pancreatitis.      Patient's lipase was elevated at this time.  Patient has a history      of pancreatitis in the past.  GI has been consulted.  2. Hypokalemia.  This is being replaced via IV.  3. Urinary tract infection.  Patient is on IV antibiotics.  This will      be continued at this time.  4. Patient also has a CT of her abdomen scheduled.  Will await results      of that.      Skeet Latch, DO  Electronically  Signed     SM/MEDQ  D:  10/26/2007  T:  10/26/2007  Job:  [id number]

## 2011-01-18 NOTE — Assessment & Plan Note (Signed)
NAME:  [name], [name]              CHART#:  [id number]   DATE:  12/05/2007                       DOB:  March 22, 1956   REFERRING PHYSICIAN:  Dr. Elfredia Nevins.   PROBLEM LIST:  1. Pancreatitis with pseudocyst formation, unknown etiology.  2. Cholecystectomy in 2003 for biliary colic.  3. Anxiety.  4. Depression.  5. Gastroesophageal reflux disease.  6. Fatty liver disease.  7. Recent hospitalization in February of 2009 for acute disseminated      encephalomyelitis and a urinary tract infection.   SUBJECTIVE:  The patient is a 55 year old female who presents in  followup from the hospital.  She denies any abdominal pain.  She has  some difficulty having bowel movements.  She has rare nausea.  She only  has vomiting with migraine headaches.  She recently saw Dr. Sherwood Gambler and he  put her back on her Klonopin.  She says she has been on nerve pills  since 1991.  She is having 1 to 2 small bowel movements a day and does  not feel like she completely evacuates her rectum.  She denies any  heartburn or indigestion and her appetite is good.   MEDICATIONS:  1. Omeprazole 20 mg twice a day.  2. Meloxicam 7.5 mg b.i.d.  3. Albuterol.  4. Klonopin 2 mg t.i.d.  5. Bupropion 100 mg b.i.d.  6. Citalopram 40 mg daily.   OBJECTIVE:  Physical exam:  Weight 149 pounds (down 22 pounds since  2003), height 5 feet 6 inches, temperature 98, blood pressure 100/70,  pulse 96.  BMI 24 (healthy).  GENERAL:  She is in no apparent distress, alert and oriented x4.  She  has good eye contact.Her lungs are clear to auscultation bilaterally.Her  cardiovascular exam is a regular rhythm, no murmur.ABDOMEN:  Bowel  sounds present, soft, nontender, nondistended.   RADIOGRAPHIC STUDIES:  CT scan on February 20 showed common bile duct to  8.5 mm and inflammatory changes in the head, neck, and uncinate process  of the pancreas.  She has multiple low-density fluid collections.  The  largest of the 2 fluid collections  were 3.8 x 2.9, and 3.9 x 2.8.  She  also had a third fluid collection which was 2.8 cm.  She was not noted  to have any pathologically enlarged lymph nodes.   ASSESSMENT:  The patient is a 55 year old female who has had 2 bouts of  pancreatitis of unclear etiology.  Her last CT scan in February 2009  showed multiple peripancreatic fluid collections.  Clinically, she does  not appear to have pancreatitis.  The etiology for her pancreatitis is  unclear.  It is possible she could have a pancreatic duct stricture,  microlithiasis, distal bile duct stones, and a low likelihood of cystic  neoplasm.  She is also complaining of having difficulty with her bowel  movements.Thank you for allowing me to see the patient in consultation.  My recommendations follow.   RECOMMENDATIONS:  1. She may reduce her omeprazole to once a day since she is not having      any problems with gastroesophageal reflux disease.  2. She should follow a low-fat diet.  She is given a handout on a low-      fat diet.  3. We will add fiber 1 to 2 times a day to achieve a satisfactory  bowel movement.  4. She is asked to drink at least 6 to 8 cups of water daily.  She may      use Milk of Magnesia once to twice a day if she is unable to      achieve a successful bowel movement.  5. She will need a CT scan of her abdomen, pancreatic protocol, to      evaluate her pancreas for dilated ducts or cystic neoplasms.  6. She has a return visit in 2 months.  She is given her discharge      instructions in writing.   ADDENDUM:  ct Scan: resolving pseudocysts. Repeat CT Scan in 2 months.       Kassie [name], M.D.  Electronically Signed     SM/MEDQ  D:  12/05/2007  T:  12/05/2007  Job:  [id number]   cc:   Madelin Rear. Sherwood Gambler, MD

## 2011-01-18 NOTE — Group Therapy Note (Signed)
[name], [name]             ACCOUNT NO.:  [id number]   MEDICAL RECORD NO.:  [id number]          PATIENT TYPE:  INP   LOCATION:  A207                          FACILITY:  [facility]   PHYSICIAN:  Skeet Latch, DO    DATE OF BIRTH:  1956/06/23   DATE OF PROCEDURE:  10/27/2007  DATE OF DISCHARGE:                                 PROGRESS NOTE   SUBJECTIVE:  Ms. [name] states that her abdominal pain is improved as  well as her nausea and vomiting.  The patient's affect still appears  very flat; I am sure this is her baseline.  Overall, the patient seems  to be doing well other than a rash in her perianal area at this time.   OBJECTIVE:  VITAL SIGNS:  Temperature is 98.0, pulse 99, respirations  20, blood pressure 99/73.  She is saturating 98% on room air.  CARDIOVASCULAR:  Regular rate and rhythm.  No murmurs, rubs or gallops.  LUNGS:  Clear to auscultation bilaterally.  No rales, rhonchi or  wheezing.  ABDOMEN:  Soft, nontender, nondistended, positive bowel sounds.  No  rigidity or guarding.  EXTREMITIES:  No clubbing, cyanosis, or edema.  NEUROLOGIC:  She still has a flat affect.  She is alert and oriented and  awake.   LABORATORIES:  So far, blood cultures are negative.  Her white count is  5.8, hemoglobin 8.9, hematocrit 26.4, platelet count 179.  Sodium 141,  potassium 2.9, chloride 109, CO2 26, glucose 85, BUN 3, creatinine 0.71.   ASSESSMENT AND PLAN:  1. Likely pyelonephritis.  The patient will be maintained on her IV      antibiotics at this time and will get a repeat UA in the next day      or two.  Continue IV hydration also.  2. History of pancreatitis.  She does have elevated lipase.  Also, she      has a peripancreatic cystic fluid collection.  And will continue to      monitor her progress.  3. For her anemia, probably secondary to dilution.  Will get a      hemoccult stool to rule out any active bleeding.  I do not believe      there is any active bleeding.  If  hemoglobin continues to drop, the      patient may need a blood transfusion.  4. For her hypokalemia, continue to replace via her IV.  The patient      may need oral potassium daily also.      Skeet Latch, DO  Electronically Signed     SM/MEDQ  D:  10/27/2007  T:  10/27/2007  Job:  [id number]

## 2011-01-18 NOTE — Group Therapy Note (Signed)
[name], [name]             ACCOUNT NO.:  [id number]   MEDICAL RECORD NO.:  [id number]          PATIENT TYPE:  INP   LOCATION:  A207                          FACILITY:  [facility]   PHYSICIAN:  Skeet Latch, DO    DATE OF BIRTH:  July 03, 1956   DATE OF PROCEDURE:  10/30/2007  DATE OF DISCHARGE:                                 PROGRESS NOTE   SUBJECTIVE:  Problem 1.  Ms. Inoue is a 55 year old Caucasian female  who presented to [facility] ER with history of nausea and vomiting.  The  patient's husband states that the patient was not feeling well.  She was  feeling weak and was not answering questions appropriately.  Approximately 3 weeks ago, she was visiting a friend in [place],  [place].  She was found to be unresponsive and stopped breathing,  and CPR was performed.  She was transferred to [facility] in  [place], and she was intubated for 48 hours.  At that time, she was  diagnosed with pancreatitis and was discharged.  After discharge, she  returned back.  She was still complaining of nausea and vomiting and  some weight loss and was having some altered mental status changes.  She  was seen by her PCP, Dr. Sherwood Gambler, and they brought her to [facility].  She  was admitted for nausea and vomiting and epigastric pain.  She was found  to have pancreatitis and was found to have pseudocysts on her CT scan  that has been followed by gastroenterology.  She was started on  pancreatic enzymes and was told to have an outpatient EGD and outpatient  CT scan at 5 weeks with Dr. Cira Servant.  Nursing staff and friend states the  patient is not feeling herself, so I ordered neurologic consult and a CT  scan yesterday.  CT scan showed baseline atrophy with progressive white  matter disease since prior examination, questionable encephalitis,  diffuse edema, aggressive ischemic change.  Suggested MRI for better  evaluation.  Neurology has seen the patient and states he is thinking  mild  encephalopathy likely due to a urinary tract infection.   Problem 2.  Episodic headaches.   Workup with RPR, TSH, homocystine, vitamin B12, and sed rate levels.   PHYSICAL EXAMINATION:  VITAL SIGNS:  Today, temperature is 97.8, pulse  84, respirations 16, blood pressure 112/60, sating 94% on room air.  GENERAL:  She looks a little lethargic.  She is awake and alert.  Will  answer questions appropriately.  Still has flat affect.  CARDIOVASCULAR:  Regular rate and rhythm.  No rubs, gallops, or murmurs.  LUNGS:  Clear to auscultation bilaterally.  No rales, rhonchi, or  wheezing.  ABDOMEN:  Still soft, slight tender on deep palpation.  No rigidity or  guarding.  Positive bowel sounds.  EXTREMITIES:  No clubbing, cyanosis, or edema.  NEUROLOGIC:  The patient's affect continues to be flat, but she will  answer questions appropriately.   LABORATORY DATA:  No labs today.   ASSESSMENT AND PLAN:  1. The patient was diagnosed with pyelonephritis.  She was on  antibiotics.  They were discontinued.  I will restart her Levaquin      500 mg intravenously daily.  2. History prostatitis.  She had pseudocyst on her CT scan.  She needs      followup CT scan as an outpatient.  Her abdominal pain seems to be      improving.  Need to increase her diet.  3. Anemia, likely dilutional.  Still will follow her hemoglobin and      hematocrit.  4. Hyperkalemia.  Seems to be resolved at this time.  The patient is      on orally daily.  5. Encephalitis.  The patient had a neurologic consult and has an MRI      pending.  Will defer to neurology regarding further recommendations      regarding this at this time.      Skeet Latch, DO  Electronically Signed     SM/MEDQ  D:  10/30/2007  T:  10/30/2007  Job:  [id number]

## 2011-01-21 NOTE — H&P (Signed)
[facility]  Patient:    [name], [name]              MRN: [id number] Adm. Date:  [id number] Attending:  Lendon Colonel                         History and Physical  CHIEF COMPLAINT:  Pelvic pain and severe dysmenorrhea.  HISTORY OF PRESENT ILLNESS:  [name] is a 55 year old gravida 1, para 1 female, whose last menstrual period was August 26, who is admitted for a hysterectomy for menstrual pain, which is unresolved with birth control pills, which is unresolved with diagnostic laparoscopy and LUNA procedure.  At the time of diagnostic laparoscopy, she had focal endometriosis and adhesions.  She is now admitted for surgery.  MEDICATIONS:  Xanax one every eight hours, and Paxil 20 mg daily.  ALLERGIES:  PENICILLIN and SULFA and CLINDAMYCIN.  PAST MEDICAL HISTORY:  She has a past history of one child by C-section and a diagnostic laparoscopy.  REVIEW OF SYSTEMS:  HEENT:  No headaches or dizziness.  No decrease in vision or auditory acuity.  HEART:  No history of hypertension, no rheumatic fever, no history of mitral valve prolapse.  LUNGS:  No chronic cough, no hemoptysis, no asthma.  GENITOURINARY:  She denies stress urinary incontinence or frequency or frequent UTIs.  GASTROINTESTINAL:  She has no bowel habit change, no melena, no weight loss, no unexplained stool per rectum or vomiting blood. MUSCLES, BONES, JOINTS:  No history of arthritis or fractures.  SOCIAL HISTORY:  She is a Scientist, product/process development . Denies alcohol or smoking.  FAMILY HISTORY:  Mother is living at 41.  Her father died at 65.  She has one brother and two sisters, both living and in good health.  Her mother has hypertension.  She has a maternal grandmother with diabetes, and her mother has Parkinsons disease.  PHYSICAL EXAMINATION:  VITAL SIGNS:  Weight 161, blood pressure 130/70.  HEENT:  Examination of eyes, ears, nose, and throat is unremarkable. Oropharynx is not  injected.  NECK:  Supple.  Thyroid is not enlarged.  Carotid pulses are equal without bruits.  Trachea is in the midline.  I feel no lymph node enlargement.  BREASTS:  No masses or tenderness.  LUNGS:  Clear.  HEART:  Normal sinus rhythm, no murmurs.  ABDOMEN:  Soft and flat.  Liver, spleen, and kidneys are not palpated.  Bowel sounds are normal.  There is a transverse incision in the abdomen from the C-section.  No tenderness noted.  EXTREMITIES:  Good range of motion and equal pulses and reflexes.  PELVIC:  A normal vulva and vagina.  Cervix is clean.  The uterus is anterior. Tenderness bilaterally without adnexal masses.  IMPRESSION:  Pelvic pain with dysmenorrhea.  PLAN:  Total abdominal hysterectomy and bilateral salpingo-oophorectomy. Detailed informed consent has been given to [place].  I in no way promised her relief of pain other than the fact that she would have no menstrual pain. DD:  05/24/00 TD:  05/24/00 Job: [id number]

## 2011-01-21 NOTE — Discharge Summary (Signed)
[facility]  Patient:    [name], [name]              MRN: [id number] Adm. Date:  [id number] Disc. Date: [id number] Attending:  Lendon Colonel                           Discharge Summary  ADMISSION DIAGNOSIS:  Severe dysmenorrhea and pelvic pain.  HISTORY OF PRESENT ILLNESS:  [name] is a 55 year old female with incapacitating pelvic pain and dysmenorrhea which had been unresolved with conservative therapy.  She was admitted for abdominal hysterectomy and removal of both ovaries.  Admission hemoglobin was 12, mean corpuscular volume was 89. Coagulation profile and metabolic profile were within normal limits.  HOSPITAL COURSE:  The patient was admitted to the hospital and underwent an uneventful hysterectomy and removal of both ovaries.  Pathology report revealed an excision of a perineal nevus which was benign.  Pathology report revealed focus of areas of adenomyosis and endometriosis on the surface of he uterine serosa.  Both ovaries were normal.  On the second postop day, she was ready for discharge.  She was to begin Premarin 0.625 mg 1 daily, and she was given a prescription for [facility] for pain and Restoril for sleep.  CONDITION UPON DISCHARGE:  Improved. DD:  06/10/00 TD:  06/12/00 Job: [id number]

## 2011-01-21 NOTE — Op Note (Signed)
NAME:  [name], [name]                       ACCOUNT NO.:  [id number]   MEDICAL RECORD NO.:  [id number]                   PATIENT TYPE:  AMB   LOCATION:  DAY                                  FACILITY:  [facility]   PHYSICIAN:  Dalia Heading, M.D.               DATE OF BIRTH:  1956-05-14   DATE OF PROCEDURE:  DATE OF DISCHARGE:                                 OPERATIVE REPORT   PREOPERATIVE DIAGNOSIS:  Chronic cholecystitis.   POSTOPERATIVE DIAGNOSIS:  Chronic cholecystitis.   PROCEDURE:  Laparoscopic cholecystectomy   SURGEON:  Dalia Heading, M.D.   ASSISTANT:  Buena Irish, M.D.   ANESTHESIA:  General endotracheal   INDICATIONS:  The patient is a 55 year old white female who is referred for  evaluation and treatment of biliary colic secondary to chronic  cholecystitis.  The risks and benefits of the procedure including bleeding,  infection, hepatobiliary injury, and the possibility of an open procedure  were fully explained to the patient, who gave informed consent.   DESCRIPTION OF PROCEDURE:  The patient was placed in the supine position.  After induction of general endotracheal anesthesia, the abdomen was prepped  and draped using the usual sterile technique with Betadine.   An supraumbilical incision was made down to the fascia.  A Veress needle was  introduced into the abdominal cavity and confirmation of placement was done  using the saline drop test.  The abdomen was then insufflated to 16 mmHg  pressure.  An 11-mm trocar was introduced into the abdominal cavity under  direct visualization without difficulty.  The patient was placed in reverse  Trendelenburg position and an additional 11-mm trocar was placed in the  epigastric region and 5-mm trocars were placed in the right upper quadrant  and right flank regions. The liver was inspected and noted to be within  normal limits.  The gallbladder was retracted superiorly and laterally.  The  dissection was begun  around the infundibulum of the gallbladder.  The cystic  duct was first identified.  Its junction to the infundibulum fully  identified.  Endoclips were placed proximally and distally on the cystic  duct; and the cystic duct was divided.  This was likewise done on the cystic  artery.  The gallbladder was then freed away from the gallbladder fossa  using Bovie electrocautery.  The gallbladder was delivered through the  epigastric trocar site without difficulty.  The gallbladder fossa was  inspected and no abnormal bleeding or bile leakage was noted.  Surgicel was  placed in the gallbladder fossa.  The subhepatic space, as well as the right  hepatic gutter irrigated with normal saline.  All fluid and air were then  evacuated from the abdominal cavity prior to removal of the trocars.   All wounds were irrigated with normal saline.  All wounds were injected with  0.5% Sensorcaine.  The supraumbilical fascia were reapproximated using an #0  Vicryl interrupted suture. All skin incisions were closed using staples.  Betadine ointment and dry sterile dressings were applied.   All tape and needle counts correct at the end of the procedure.  The patient  was extubated in the operating room and went back to recovery room in awake  and stable condition.   COMPLICATIONS:  None.   SPECIMENS:  Gallbladder.   BLOOD LOSS:  Minimal.                                               Dalia Heading, M.D.    MAJ/MEDQ  D:  08/09/2002  T:  08/09/2002  Job:  [id number]   cc:   Corrie Mckusick, M.D.  [address]  [place]  [place] [address]  Fax: [phone]

## 2011-01-21 NOTE — Op Note (Signed)
[facility]  Patient:    [name], [name]              MRN: [id number] Proc. Date: 05/24/00 Adm. Date:  [id number] Attending:  Lendon Colonel                           Operative Report  PREOPERATIVE DIAGNOSES:  Pelvic pain with severe dysmenorrhea.  POSTOPERATIVE DIAGNOSES:  Pelvic pain with severe dysmenorrhea.  OPERATION:  Total abdominal hysterectomy and bilateral salpingo-oophorectomy. Exploratory laparotomy.  SURGEON:  Katherine Roan, M.D.  DESCRIPTION OF PROCEDURE:  The patient was placed in the lithotomy position, prepped and draped in the usual fashion.  Foley catheter was inserted.  A transverse incision was made in the abdomen and extended in layers to the peritoneal cavity which was entered vertically.  Exploration of the upper abdomen revealed a smooth liver, two kidneys that were normal. No periaortic adenopathy was appreciated.  There was no free fluid within the peritoneal cavity.  The small bowel appeared normal.  This was then tacked around from the pelvic viscera.  The uterus was normal size and shape.  Both ovaries appear normal and mobile.  The infundibulopelvic ligament were skeletonized and ligated with 0 chromic suture and ligatures as well as the round ligament. The uterine arteries clamped, were skeletonized and isolated.  The bladder was pushed off the lower segment.  The cardinals and uterosacral ligaments were clamped in succession and ligated with 0 chromic.  The vaginal was then entered and the specimens removed from the operative field.  The vagina was then closed with a transverse suture of 0 chromic and a figure-of-eight suture of 0 Vicryl to close the vagina in a vertical plane.  Hemostasis was secured, reperitonealization accomplished with 3-0 Vicryl.  The parietoperitoneum was closed with 2-0 PDS.  The fascia was closed with continuous suture of 2-0 PDS and interrupted sutures of 2-0 PDS.  Hemostasis  was secure.  The incision was infiltrated with 0.5% Marcaine with epinephrine.  The skin was then clipped with clips and infiltrated with about 18 cc of 0.5% Marcaine with epinephrine. We then went down below on the left buttocks and excised a perineal nevus by infiltrating this with 0.5% Marcaine and then excising it and cauterizing the base.  [name] tolerated this procedure well.  Estimated blood loss was about 100 cc or less.  She was sent to the recovery room in good condition. DD:  05/24/00 TD:  05/25/00 Job: [id number]

## 2011-01-21 NOTE — H&P (Signed)
NAME:  [name], [name]                         ACCOUNT NO.:  [id number]   MEDICAL RECORD NO.:  [id number]                   PATIENT TYPE:   LOCATION:                                       FACILITY:   PHYSICIAN:  Dalia Heading, M.D.               DATE OF BIRTH:   DATE OF ADMISSION:  08/09/2002  DATE OF DISCHARGE:                                HISTORY & PHYSICAL   AGE:  55 years old.   CHIEF COMPLAINT:  Chronic cholecystitis.   HISTORY OF PRESENT ILLNESS:  The patient is a 55 year old white female who  is referred for evaluation and treatment of biliary colic.  She has been  having right upper quadrant abdominal pain, nausea, bloating, and fatty food  intolerance over the past few months.  It has been getting worse.  No fever,  chills, or jaundice have been noted.  There is no history of peptic ulcer  disease.  She has been placed on medications for irritable bowel syndrome,  which have not been helpful.   PAST MEDICAL HISTORY:  1. Reflux disease.  2. Depression.  3. Anxiety.   PAST SURGICAL HISTORY:  1. Hysterectomy.  2. Diagnostic laparoscopy.   CURRENT MEDICATIONS:  Promethazine, dicyclomine, Zyrtec, Aciphex, Premarin,  Zoloft, diazepam.   ALLERGIES:  SULFA and PENICILLIN.   REVIEW OF SYSTEMS:  The patient denies drinking or smoking.  She denies any  other cardiopulmonary difficulties or bleeding disorders.   PHYSICAL EXAMINATION:  GENERAL:  Well-developed, well-nourished white female  in no acute distress.  VITAL SIGNS:  Afebrile, and vital signs are stable.  LUNGS:  Clear to auscultation with equal breath sounds bilaterally.  HEART:  Regular rate and rhythm.  Without S3, S4, or murmurs.  ABDOMEN:  Soft, with tenderness noted in the right upper quadrant to  palpation.  No hepatosplenomegaly, masses, hernias, or rigidity are noted.   LABORATORY DATA:  Ultrasound of the gallbladder is negative.  Hepatobiliary  scan reveals a normal gallbladder ejection fraction  with reproducible  symptoms with CCK injection.   IMPRESSION:  1. Biliary colic.  2. Chronic cholecystitis.   PLAN:  The patient is scheduled for a laparoscopic cholecystectomy on  August 09, 2002.  The risks and benefits of the procedure including  bleeding, infection, hepatobiliary injury, and the possibility of an open  procedure were fully explained to the patient, who gave informed consent.                                                 Dalia Heading, M.D.    MAJ/MEDQ  D:  07/23/2002  T:  07/23/2002  Job:  [id number]   cc:   Corrie Mckusick, M.D.  [address]  [place]  [place] [address]  Fax:  [phone]  

## 2011-01-26 ENCOUNTER — Other Ambulatory Visit (HOSPITAL_COMMUNITY): Payer: Self-pay | Admitting: Internal Medicine

## 2011-01-26 DIAGNOSIS — Z01419 Encounter for gynecological examination (general) (routine) without abnormal findings: Secondary | ICD-10-CM

## 2011-02-03 ENCOUNTER — Ambulatory Visit (HOSPITAL_COMMUNITY)
Admission: RE | Admit: 2011-02-03 | Discharge: 2011-02-03 | Disposition: A | Discharge status: Home / Self Care | Payer: BC Managed Care – PPO | Source: Ambulatory Visit | Attending: Internal Medicine | Admitting: Internal Medicine

## 2011-02-03 DIAGNOSIS — Z01419 Encounter for gynecological examination (general) (routine) without abnormal findings: Secondary | ICD-10-CM

## 2011-02-03 DIAGNOSIS — Z1231 Encounter for screening mammogram for malignant neoplasm of breast: Secondary | ICD-10-CM | POA: Insufficient documentation

## 2011-05-27 LAB — DIFFERENTIAL
Basophils Absolute: 0
Basophils Absolute: 0
Basophils Relative: 0
Basophils Relative: 1
Eosinophils Absolute: 0.2
Eosinophils Relative: 1
Lymphocytes Relative: 28
Lymphocytes Relative: 30
Lymphocytes Relative: 31
Lymphocytes Relative: 35
Lymphocytes Relative: 41
Lymphs Abs: 1.5
Lymphs Abs: 2.7
Monocytes Absolute: 0.3
Monocytes Absolute: 0.4
Monocytes Relative: 6
Monocytes Relative: 7
Neutro Abs: 3.3
Neutro Abs: 3.3
Neutro Abs: 3.8
Neutrophils Relative %: 49
Neutrophils Relative %: 60
Neutrophils Relative %: 66

## 2011-05-27 LAB — BASIC METABOLIC PANEL
BUN: 1 — ABNORMAL LOW
BUN: 1 — ABNORMAL LOW
BUN: 1 — ABNORMAL LOW
BUN: 3 — ABNORMAL LOW
BUN: 6
CO2: 26
CO2: 26
CO2: 26
CO2: 28
Calcium: 8.1 — ABNORMAL LOW
Calcium: 8.2 — ABNORMAL LOW
Calcium: 8.4
Calcium: 8.5
Calcium: 8.6
Calcium: 8.6
Chloride: 103
Chloride: 109
Chloride: 111
Chloride: 112
Creatinine, Ser: 0.66
Creatinine, Ser: 0.67
Creatinine, Ser: 0.68
Creatinine, Ser: 0.71
Creatinine, Ser: 0.77
GFR calc Af Amer: >60
GFR calc Af Amer: >60
GFR calc Af Amer: >60
GFR calc Af Amer: >60
GFR calc Af Amer: >60
GFR calc non Af Amer: >60
GFR calc non Af Amer: >60
GFR calc non Af Amer: >60
GFR calc non Af Amer: >60
GFR calc non Af Amer: >60
GFR calc non Af Amer: >60
GFR calc non Af Amer: >60
GFR calc non Af Amer: >60
GFR calc non Af Amer: >60
Glucose, Bld: 101 — ABNORMAL HIGH
Glucose, Bld: 123 — ABNORMAL HIGH
Glucose, Bld: 158 — ABNORMAL HIGH
Glucose, Bld: 85
Glucose, Bld: 92
Glucose, Bld: 94
Potassium: 2.5 — CL
Potassium: 2.9 — ABNORMAL LOW
Potassium: 3.1 — ABNORMAL LOW
Potassium: 3.3 — ABNORMAL LOW
Potassium: 3.5
Potassium: 3.6
Sodium: 137
Sodium: 141
Sodium: 143
Sodium: 145

## 2011-05-27 LAB — URINE MICROSCOPIC-ADD ON

## 2011-05-27 LAB — URINALYSIS, ROUTINE W REFLEX MICROSCOPIC
Hgb urine dipstick: NEGATIVE
Nitrite: POSITIVE — AB
pH: >9 — ABNORMAL HIGH

## 2011-05-27 LAB — MAGNESIUM
Magnesium: 1.2 — ABNORMAL LOW
Magnesium: 1.7
Magnesium: 1.9

## 2011-05-27 LAB — TYPE AND SCREEN
ABO/RH(D): A POS
Antibody Screen: NEGATIVE

## 2011-05-27 LAB — CULTURE, BLOOD (ROUTINE X 2)
Culture: NO GROWTH
Culture: NO GROWTH
Report Status: 2242009
Report Status: 2242009

## 2011-05-27 LAB — CBC
HCT: 26.4 — ABNORMAL LOW
HCT: 28.6 — ABNORMAL LOW
HCT: 37.5
Hemoglobin: 8.9 — ABNORMAL LOW
Hemoglobin: 8.9 — ABNORMAL LOW
MCHC: 33.7
MCHC: 33.7
MCV: 83.3
MCV: 83.8
Platelets: 169
Platelets: 179
Platelets: 181
Platelets: 215
RBC: 3.16 — ABNORMAL LOW
RBC: 3.42 — ABNORMAL LOW
RBC: 4.5
RDW: 14.1
RDW: 14.1
RDW: 14.4
WBC: 5.1
WBC: 6.4
WBC: 6.7

## 2011-05-27 LAB — COMPREHENSIVE METABOLIC PANEL
CO2: 35 — ABNORMAL HIGH
Calcium: 9.6
Chloride: 95 — ABNORMAL LOW
GFR calc Af Amer: >60
GFR calc non Af Amer: >60
Glucose, Bld: 106 — ABNORMAL HIGH
Potassium: 2.5 — CL
Sodium: 139
Total Bilirubin: 1.7 — ABNORMAL HIGH
Total Protein: 7.7

## 2011-05-27 LAB — HEMOGLOBIN AND HEMATOCRIT, BLOOD
HCT: 27.6 — ABNORMAL LOW
Hemoglobin: 9.4 — ABNORMAL LOW
Hemoglobin: 9.6 — ABNORMAL LOW

## 2011-05-27 LAB — HEPATIC FUNCTION PANEL
ALT: 10
AST: 16
Albumin: 2.6 — ABNORMAL LOW
Total Protein: 5.8 — ABNORMAL LOW

## 2011-05-27 LAB — IRON AND TIBC
Iron: 24 — ABNORMAL LOW
TIBC: 238 — ABNORMAL LOW
UIBC: 214

## 2011-05-27 LAB — HOMOCYSTEINE: Homocysteine: 5.6

## 2011-05-27 LAB — FERRITIN: Ferritin: 62 (ref 10–291)

## 2011-05-27 LAB — LIPASE, BLOOD
Lipase: 144 — ABNORMAL HIGH
Lipase: 70 — ABNORMAL HIGH

## 2011-05-27 LAB — RPR: RPR Ser Ql: NONREACTIVE

## 2011-05-27 LAB — AMYLASE
Amylase: 40
Amylase: 92

## 2011-05-27 LAB — URINE CULTURE

## 2011-05-30 LAB — DIFFERENTIAL
Basophils Absolute: 0
Basophils Relative: 0
Eosinophils Relative: 4
Monocytes Absolute: 0.6
Monocytes Relative: 7

## 2011-05-30 LAB — CBC
HCT: 32.8 — ABNORMAL LOW
Hemoglobin: 10.9 — ABNORMAL LOW
MCHC: 33.3
MCV: 83.7
Platelets: 342
RDW: 14.6

## 2011-05-30 LAB — URINALYSIS, ROUTINE W REFLEX MICROSCOPIC
Bilirubin Urine: NEGATIVE
Glucose, UA: NEGATIVE
Hgb urine dipstick: NEGATIVE
Ketones, ur: NEGATIVE
Nitrite: NEGATIVE
pH: 6.5

## 2011-05-30 LAB — BASIC METABOLIC PANEL
BUN: 3 — ABNORMAL LOW
CO2: 28
Chloride: 103
Glucose, Bld: 93
Potassium: 4
Sodium: 136

## 2011-10-26 DIAGNOSIS — Z0000 Encounter for general adult medical examination without abnormal findings: Secondary | ICD-10-CM | POA: Diagnosis not present

## 2011-10-26 DIAGNOSIS — K219 Gastro-esophageal reflux disease without esophagitis: Secondary | ICD-10-CM | POA: Diagnosis not present

## 2011-10-26 DIAGNOSIS — Z6828 Body mass index (BMI) 28.0-28.9, adult: Secondary | ICD-10-CM | POA: Diagnosis not present

## 2011-10-26 DIAGNOSIS — E785 Hyperlipidemia, unspecified: Secondary | ICD-10-CM | POA: Diagnosis not present

## 2011-12-06 DIAGNOSIS — G5790 Unspecified mononeuropathy of unspecified lower limb: Secondary | ICD-10-CM | POA: Diagnosis not present

## 2011-12-06 DIAGNOSIS — M79609 Pain in unspecified limb: Secondary | ICD-10-CM | POA: Diagnosis not present

## 2012-01-03 DIAGNOSIS — G5790 Unspecified mononeuropathy of unspecified lower limb: Secondary | ICD-10-CM | POA: Diagnosis not present

## 2012-01-03 DIAGNOSIS — M79609 Pain in unspecified limb: Secondary | ICD-10-CM | POA: Diagnosis not present

## 2012-02-03 DIAGNOSIS — Z6828 Body mass index (BMI) 28.0-28.9, adult: Secondary | ICD-10-CM | POA: Diagnosis not present

## 2012-02-03 DIAGNOSIS — L259 Unspecified contact dermatitis, unspecified cause: Secondary | ICD-10-CM | POA: Diagnosis not present

## 2012-02-03 DIAGNOSIS — B372 Candidiasis of skin and nail: Secondary | ICD-10-CM | POA: Diagnosis not present

## 2012-02-09 DIAGNOSIS — F329 Major depressive disorder, single episode, unspecified: Secondary | ICD-10-CM | POA: Diagnosis not present

## 2012-02-09 DIAGNOSIS — R05 Cough: Secondary | ICD-10-CM | POA: Diagnosis not present

## 2012-02-09 DIAGNOSIS — Z6829 Body mass index (BMI) 29.0-29.9, adult: Secondary | ICD-10-CM | POA: Diagnosis not present

## 2012-02-09 DIAGNOSIS — N390 Urinary tract infection, site not specified: Secondary | ICD-10-CM | POA: Diagnosis not present

## 2012-03-29 DIAGNOSIS — IMO0002 Reserved for concepts with insufficient information to code with codable children: Secondary | ICD-10-CM | POA: Diagnosis not present

## 2012-03-29 DIAGNOSIS — J45909 Unspecified asthma, uncomplicated: Secondary | ICD-10-CM | POA: Diagnosis not present

## 2012-03-29 DIAGNOSIS — Z880 Allergy status to penicillin: Secondary | ICD-10-CM | POA: Diagnosis not present

## 2012-03-29 DIAGNOSIS — Z888 Allergy status to other drugs, medicaments and biological substances status: Secondary | ICD-10-CM | POA: Diagnosis not present

## 2012-03-29 DIAGNOSIS — D2120 Benign neoplasm of connective and other soft tissue of unspecified lower limb, including hip: Secondary | ICD-10-CM | POA: Diagnosis not present

## 2012-03-29 DIAGNOSIS — Z8679 Personal history of other diseases of the circulatory system: Secondary | ICD-10-CM | POA: Diagnosis not present

## 2012-03-29 DIAGNOSIS — Z882 Allergy status to sulfonamides status: Secondary | ICD-10-CM | POA: Diagnosis not present

## 2012-03-29 DIAGNOSIS — Z79899 Other long term (current) drug therapy: Secondary | ICD-10-CM | POA: Diagnosis not present

## 2012-03-29 DIAGNOSIS — F329 Major depressive disorder, single episode, unspecified: Secondary | ICD-10-CM | POA: Diagnosis not present

## 2012-03-29 DIAGNOSIS — G5760 Lesion of plantar nerve, unspecified lower limb: Secondary | ICD-10-CM | POA: Diagnosis not present

## 2012-03-29 DIAGNOSIS — F410 Panic disorder [episodic paroxysmal anxiety] without agoraphobia: Secondary | ICD-10-CM | POA: Diagnosis not present

## 2012-03-29 DIAGNOSIS — G4730 Sleep apnea, unspecified: Secondary | ICD-10-CM | POA: Diagnosis not present

## 2012-03-29 DIAGNOSIS — Z883 Allergy status to other anti-infective agents status: Secondary | ICD-10-CM | POA: Diagnosis not present

## 2012-03-29 DIAGNOSIS — E785 Hyperlipidemia, unspecified: Secondary | ICD-10-CM | POA: Diagnosis not present

## 2012-03-29 DIAGNOSIS — K219 Gastro-esophageal reflux disease without esophagitis: Secondary | ICD-10-CM | POA: Diagnosis not present

## 2012-06-12 DIAGNOSIS — R21 Rash and other nonspecific skin eruption: Secondary | ICD-10-CM | POA: Diagnosis not present

## 2012-06-12 DIAGNOSIS — L259 Unspecified contact dermatitis, unspecified cause: Secondary | ICD-10-CM | POA: Diagnosis not present

## 2012-08-17 DIAGNOSIS — Z6831 Body mass index (BMI) 31.0-31.9, adult: Secondary | ICD-10-CM | POA: Diagnosis not present

## 2012-08-17 DIAGNOSIS — J069 Acute upper respiratory infection, unspecified: Secondary | ICD-10-CM | POA: Diagnosis not present

## 2012-09-06 DIAGNOSIS — Z23 Encounter for immunization: Secondary | ICD-10-CM | POA: Diagnosis not present

## 2012-11-23 DIAGNOSIS — Z6830 Body mass index (BMI) 30.0-30.9, adult: Secondary | ICD-10-CM | POA: Diagnosis not present

## 2012-11-23 DIAGNOSIS — Z0000 Encounter for general adult medical examination without abnormal findings: Secondary | ICD-10-CM | POA: Diagnosis not present

## 2012-11-27 ENCOUNTER — Other Ambulatory Visit (HOSPITAL_COMMUNITY): Payer: Self-pay | Admitting: Internal Medicine

## 2012-11-27 DIAGNOSIS — Z139 Encounter for screening, unspecified: Secondary | ICD-10-CM

## 2012-12-03 ENCOUNTER — Ambulatory Visit (HOSPITAL_COMMUNITY)
Admission: RE | Admit: 2012-12-03 | Discharge: 2012-12-03 | Disposition: A | Discharge status: Home / Self Care | Payer: BC Managed Care – PPO | Source: Ambulatory Visit | Attending: Internal Medicine | Admitting: Internal Medicine

## 2012-12-03 DIAGNOSIS — Z1231 Encounter for screening mammogram for malignant neoplasm of breast: Secondary | ICD-10-CM | POA: Insufficient documentation

## 2012-12-03 DIAGNOSIS — Z139 Encounter for screening, unspecified: Secondary | ICD-10-CM

## 2013-01-01 DIAGNOSIS — IMO0002 Reserved for concepts with insufficient information to code with codable children: Secondary | ICD-10-CM | POA: Diagnosis not present

## 2013-01-01 DIAGNOSIS — J029 Acute pharyngitis, unspecified: Secondary | ICD-10-CM | POA: Diagnosis not present

## 2013-01-01 DIAGNOSIS — M25569 Pain in unspecified knee: Secondary | ICD-10-CM | POA: Diagnosis not present

## 2013-01-01 DIAGNOSIS — Z6831 Body mass index (BMI) 31.0-31.9, adult: Secondary | ICD-10-CM | POA: Diagnosis not present

## 2013-01-16 ENCOUNTER — Encounter: Payer: Self-pay | Admitting: Orthopedic Surgery

## 2013-01-16 ENCOUNTER — Ambulatory Visit (INDEPENDENT_AMBULATORY_CARE_PROVIDER_SITE_OTHER): Payer: BC Managed Care – PPO | Admitting: Orthopedic Surgery

## 2013-01-16 ENCOUNTER — Ambulatory Visit (INDEPENDENT_AMBULATORY_CARE_PROVIDER_SITE_OTHER): Payer: BC Managed Care – PPO

## 2013-01-16 VITALS — BP 138/80 | Ht 66.0 in | Wt 185.0 lb

## 2013-01-16 DIAGNOSIS — M25562 Pain in left knee: Secondary | ICD-10-CM

## 2013-01-16 DIAGNOSIS — M25569 Pain in unspecified knee: Secondary | ICD-10-CM | POA: Diagnosis not present

## 2013-01-16 DIAGNOSIS — S83242A Other tear of medial meniscus, current injury, left knee, initial encounter: Secondary | ICD-10-CM

## 2013-01-16 DIAGNOSIS — IMO0002 Reserved for concepts with insufficient information to code with codable children: Secondary | ICD-10-CM | POA: Diagnosis not present

## 2013-01-16 MED ORDER — HYDROCODONE-ACETAMINOPHEN 10-325 MG PO TABS: 1.0000 | ORAL_TABLET | ORAL | Status: DC | PRN

## 2013-01-16 NOTE — Patient Instructions (Signed)
Plan MRI left knee to prepare for surgery for torn medial meniscus  Recommend brace, continue hydrocodone 10 mg use ice for swelling continue with her cane  Follow up after MRI

## 2013-01-16 NOTE — Progress Notes (Signed)
Patient ID: [name], female   DOB: 01-20-1956, 57 y.o.   MRN: [id number] Chief Complaint  Patient presents with  . Leg Pain    Left leg and knee pain.d/t steeping wrong getting off church van. Referred by Dr. Phillips Odor    History approximately 3 weeks ago the patient stepped out of a church van and twisted her left knee. Since that time she's noted progressive and increasing pain on the medial side of the knee which is described as sharp burning 9/10 unrelieved by 10 mg of hydrocodone. The pain is constant and is on the medial side of the knee she has a morning and night is worse when walking or trying to get up it's better with rest and putting a pillow under the knee. She noted joint effusion some bruising she's had to use a cane to ambulate. The leg will give out as well as mechanical symptoms of catching  She is allergic to sulfa medication and is somewhat  Her family history is notable for heart disease arthritis lung disease and asthma  Her medications are hydrocodone 10, alprazolam 1 mg 4 times a day as needed, sertraline 150 mg once a day, gabapentin 400 mg 3 times a day, Celexa 40 mg a day, Lasix 20 mg as needed , allopurinol 300 mg a day, omeprazole one a day, pravastatin 80 mg a day, meclizine 25 mg a day as needed aspirin 81 mg a day  BP 138/80  Ht 5\' 6"  (1.676 m)  Wt 185 lb (83.915 kg)  BMI 29.87 kg/m2 General appearance is normal, the patient is alert and oriented x3 with normal mood and affect. She did exhibit some anxiety however. She is angulating with a cane and very poorly she had trouble stepping up on the exam table on the first step.  Upper extremity exam  The right and left upper extremity:   Inspection and palpation revealed no abnormalities in the upper extremities.   Range of motion is full without contracture.  Motor exam is normal with grade 5 strength.  The joints are fully reduced without subluxation.  There is no atrophy or tremor and muscle  tone is normal.  All joints are stable.   Right lower extremity inspection and palpation revealed no abnormalities, she had full range of motion. All of her ligaments were tested number stable. Strength was normal muscle tone was normal no atrophy. Skin was normal. Pulse and temperature in the leg was normal without edema. No lymphadenopathy. Sensation was intact reflexes were negative/normal and her balance was poor but believe this to be secondary to her knee pain  Left lower extremity: Severe tenderness over the medial joint line small joint effusion range of motion is full painful internal flexion positive crepitus for meniscal tear all ligaments were tested were stable strength muscle tone was normal no atrophy skin was intact McMurray sign was positive screw home test was equivocal  Skin was intact without rash or lesion pulse and temperature were normal no edema no swelling no lymphadenopathy sensation was intact but light reflexes none balance again abnormal secondary to knee pain  X-rays no fracture seen on x-ray no joint effusion seen minimal degenerative change if any  Diagnosis torn medial meniscus  Plan MRI left knee to prepare for surgery for torn medial meniscus  Recommend brace, continue hydrocodone 10 mg use ice for swelling continue with her cane  Follow up after MRI

## 2013-01-24 ENCOUNTER — Telehealth: Payer: Self-pay | Admitting: Radiology

## 2013-01-24 NOTE — Telephone Encounter (Signed)
Patient has an Mri at [facility] on 01-30-13 at 7:30 pm. Patient has [employer], authorization # [id number] and it expires on 02-21-13. Patient will follow up for her results back here in our office.

## 2013-01-30 ENCOUNTER — Ambulatory Visit
Admission: RE | Admit: 2013-01-30 | Discharge: 2013-01-30 | Disposition: A | Discharge status: Home / Self Care | Payer: BC Managed Care – PPO | Source: Ambulatory Visit | Attending: Orthopedic Surgery | Admitting: Orthopedic Surgery

## 2013-01-30 DIAGNOSIS — IMO0002 Reserved for concepts with insufficient information to code with codable children: Secondary | ICD-10-CM | POA: Diagnosis not present

## 2013-01-30 DIAGNOSIS — S83242A Other tear of medial meniscus, current injury, left knee, initial encounter: Secondary | ICD-10-CM

## 2013-02-11 ENCOUNTER — Ambulatory Visit (INDEPENDENT_AMBULATORY_CARE_PROVIDER_SITE_OTHER): Payer: BC Managed Care – PPO | Admitting: Orthopedic Surgery

## 2013-02-11 ENCOUNTER — Encounter: Payer: Self-pay | Admitting: Orthopedic Surgery

## 2013-02-11 VITALS — BP 122/80 | Ht 66.0 in | Wt 185.0 lb

## 2013-02-11 DIAGNOSIS — M1710 Unilateral primary osteoarthritis, unspecified knee: Secondary | ICD-10-CM | POA: Diagnosis not present

## 2013-02-11 MED ORDER — DICLOFENAC POTASSIUM 50 MG PO TABS: 50.0000 mg | ORAL_TABLET | Freq: Two times a day (BID) | ORAL | Status: DC

## 2013-02-11 NOTE — Patient Instructions (Addendum)
You have received a steroid shot. 15% of patients experience increased pain at the injection site with in the next 24 hours. This is best treated with ice and tylenol extra strength 2 tabs every 8 hours. If you are still having pain please call the office.    

## 2013-02-11 NOTE — Progress Notes (Signed)
Patient ID: [name], female   DOB: 09-26-1955, 57 y.o.   MRN: [id number]  Chief Complaint  Patient presents with  . Results    MRI Results   Persistent left knee pain. We've treated her so far with the brace and a cane to for an MRI these are the following results IMPRESSION:  1. Prominent degenerative signal within the posterior horn of the  medial meniscus. No meniscal tear or displaced meniscal fragment  demonstrated.  2. Mild tricompartmental degenerative changes. No acute  osteochondral findings.  3. Intact lateral meniscus, cruciate and collateral ligaments. After reviewing these x-rays and MRI films I agree with the report there is no tear there is some degeneration in the posterior horn  No new trauma  Review of systems musculoskeletal no new findings BP 122/80  Ht 5\' 6"  (1.676 m)  Wt 185 lb (83.915 kg)  BMI 29.87 kg/m2 General appearance is normal, the patient is alert and oriented x3 with normal mood and affect. She has a tender medial compartment no effusion. Knee is stable. Neurovascular exam is intact  Impression Encounter Diagnosis  Name Primary?  . OA (osteoarthritis) of knee Yes     We recommend cortisone injection Knee  Injection Procedure Note  Pre-operative Diagnosis: left knee oa  Post-operative Diagnosis: same  Indications: pain  Anesthesia: ethyl chloride   Procedure Details   Verbal consent was obtained for the procedure. Time out was completed.The joint was prepped with alcohol, followed by  Ethyl chloride spray and A 20 gauge needle was inserted into the knee via lateral approach; 4ml 1% lidocaine and 1 ml of depomedrol  was then injected into the joint . The needle was removed and the area cleansed and dressed.  Complications:  None; patient tolerated the procedure well.  If she does not improve with diclofenac and injection then we will try to get hyaluronic acid if that doesn't work then arthroscopy if that doesn't work then knee  replacement

## 2013-02-12 ENCOUNTER — Telehealth: Payer: Self-pay | Admitting: Orthopedic Surgery

## 2013-02-12 NOTE — Telephone Encounter (Signed)
Kary Kos asked if you can prescribe the Hydrocodone that does not have Tylenol in it.  She uses [employer]

## 2013-02-12 NOTE — Telephone Encounter (Signed)
No

## 2013-02-12 NOTE — Telephone Encounter (Signed)
Advised of doctor's reply °

## 2013-02-18 ENCOUNTER — Other Ambulatory Visit: Payer: Self-pay | Admitting: *Deleted

## 2013-02-18 DIAGNOSIS — S83242A Other tear of medial meniscus, current injury, left knee, initial encounter: Secondary | ICD-10-CM

## 2013-02-18 DIAGNOSIS — M1710 Unilateral primary osteoarthritis, unspecified knee: Secondary | ICD-10-CM

## 2013-02-18 MED ORDER — HYDROCODONE-ACETAMINOPHEN 5-325 MG PO TABS: 1.0000 | ORAL_TABLET | Freq: Four times a day (QID) | ORAL | Status: DC | PRN

## 2013-02-19 ENCOUNTER — Encounter: Payer: Self-pay | Admitting: Orthopedic Surgery

## 2013-02-19 ENCOUNTER — Telehealth: Payer: Self-pay | Admitting: Orthopedic Surgery

## 2013-02-19 NOTE — Telephone Encounter (Signed)
[name] said she has had numbness in her left arm since the middle of last week, and is asking if the Diclofenac could be the cause of the numbness. Said she takes 50 mg 2 times a day.  Said she will also check with her pharmacist. Her # [phone]

## 2013-02-19 NOTE — Telephone Encounter (Signed)
STOP ALL NEW MEDICATIONS

## 2013-02-20 NOTE — Telephone Encounter (Signed)
Advised the patient of doctor's reply °

## 2013-03-13 DIAGNOSIS — J45909 Unspecified asthma, uncomplicated: Secondary | ICD-10-CM | POA: Diagnosis not present

## 2013-03-13 DIAGNOSIS — J019 Acute sinusitis, unspecified: Secondary | ICD-10-CM | POA: Diagnosis not present

## 2013-03-13 DIAGNOSIS — Z6831 Body mass index (BMI) 31.0-31.9, adult: Secondary | ICD-10-CM | POA: Diagnosis not present

## 2013-03-26 ENCOUNTER — Ambulatory Visit (INDEPENDENT_AMBULATORY_CARE_PROVIDER_SITE_OTHER): Payer: BC Managed Care – PPO | Admitting: Orthopedic Surgery

## 2013-03-26 ENCOUNTER — Encounter: Payer: Self-pay | Admitting: Orthopedic Surgery

## 2013-03-26 VITALS — BP 142/92 | Ht 66.0 in | Wt 185.0 lb

## 2013-03-26 DIAGNOSIS — M1710 Unilateral primary osteoarthritis, unspecified knee: Secondary | ICD-10-CM | POA: Diagnosis not present

## 2013-03-26 NOTE — Patient Instructions (Signed)
CONTINUE MEDICATION AND CALL OFFICE IF PAIN WORSENS

## 2013-03-26 NOTE — Progress Notes (Signed)
Patient ID: [name], female   DOB: 10-23-1955, 57 y.o.   MRN: [id number] Chief Complaint  Patient presents with  . Follow-up    6 week recheck left knee s/p injection   BP 142/92  Ht 5\' 6"  (1.676 m)  Wt 185 lb (83.915 kg)  BMI 29.87 kg/m2 No diagnosis found.  The patient has had an MRI of her left knee she had an injection she has arthritic knee with a questionable tear of the medial meniscus is read as negative on MRI but continues to have significant medial joint line symptoms and pain with ambulation and sometimes at rest she has difficulty with normal daily activities  She's not ready to have surgery. She's been using Norco, did not tolerate diclofenac gave her stomach upset. She's also using salon Pas with fairly good result  Denies catching locking giving way  BP 142/92  Ht 5\' 6"  (1.676 m)  Wt 185 lb (83.915 kg)  BMI 29.87 kg/m2  General appearance is normal, the patient is alert and oriented x3 with normal mood and affect. Again medial joint line tenderness, knee flexion pretty good no swelling, neurovascular exam intact ambulates normally without assistive device  OA left knee Possible medial meniscal tear posterior horn derangement  We agreed that she may need arthroscopic surgery, but for now we will continue Norco for pain and the patch that she is using and she will call us if her pain gets worse

## 2013-04-11 ENCOUNTER — Other Ambulatory Visit: Payer: Self-pay | Admitting: Orthopedic Surgery

## 2013-04-11 DIAGNOSIS — M1710 Unilateral primary osteoarthritis, unspecified knee: Secondary | ICD-10-CM

## 2013-04-11 MED ORDER — HYDROCODONE-ACETAMINOPHEN 5-325 MG PO TABS: 1.0000 | ORAL_TABLET | Freq: Four times a day (QID) | ORAL | Status: DC | PRN

## 2013-05-01 DIAGNOSIS — N390 Urinary tract infection, site not specified: Secondary | ICD-10-CM | POA: Diagnosis not present

## 2013-05-01 DIAGNOSIS — Z6831 Body mass index (BMI) 31.0-31.9, adult: Secondary | ICD-10-CM | POA: Diagnosis not present

## 2013-05-01 DIAGNOSIS — R42 Dizziness and giddiness: Secondary | ICD-10-CM | POA: Diagnosis not present

## 2013-06-21 DIAGNOSIS — Z23 Encounter for immunization: Secondary | ICD-10-CM | POA: Diagnosis not present

## 2013-06-21 DIAGNOSIS — F411 Generalized anxiety disorder: Secondary | ICD-10-CM | POA: Diagnosis not present

## 2013-06-21 DIAGNOSIS — Z6831 Body mass index (BMI) 31.0-31.9, adult: Secondary | ICD-10-CM | POA: Diagnosis not present

## 2013-06-27 ENCOUNTER — Ambulatory Visit (INDEPENDENT_AMBULATORY_CARE_PROVIDER_SITE_OTHER): Payer: BC Managed Care – PPO

## 2013-06-27 ENCOUNTER — Ambulatory Visit (INDEPENDENT_AMBULATORY_CARE_PROVIDER_SITE_OTHER): Payer: BC Managed Care – PPO | Admitting: Orthopedic Surgery

## 2013-06-27 ENCOUNTER — Encounter: Payer: Self-pay | Admitting: Orthopedic Surgery

## 2013-06-27 VITALS — BP 122/82 | Ht 66.0 in | Wt 185.0 lb

## 2013-06-27 DIAGNOSIS — M1710 Unilateral primary osteoarthritis, unspecified knee: Secondary | ICD-10-CM | POA: Diagnosis not present

## 2013-06-27 DIAGNOSIS — M47817 Spondylosis without myelopathy or radiculopathy, lumbosacral region: Secondary | ICD-10-CM | POA: Diagnosis not present

## 2013-06-27 DIAGNOSIS — M549 Dorsalgia, unspecified: Secondary | ICD-10-CM | POA: Diagnosis not present

## 2013-06-27 DIAGNOSIS — M4726 Other spondylosis with radiculopathy, lumbar region: Secondary | ICD-10-CM

## 2013-06-27 MED ORDER — HYDROCODONE-ACETAMINOPHEN 5-325 MG PO TABS: 1.0000 | ORAL_TABLET | Freq: Three times a day (TID) | ORAL | Status: DC | PRN

## 2013-06-27 MED ORDER — PREDNISONE 10 MG PO KIT: 10.0000 mg | PACK | ORAL | Status: DC

## 2013-06-27 NOTE — Patient Instructions (Addendum)
Left knee degenerative arthritis possible meniscal tear  Spondylosis lumbar spine. Recommend physical therapy regarding her lumbar spine to see if your knee pain gets better. Also start Sterapred dosepak to address the knee pain which is most likely coming from her lumbar spine condition  Degenerative Disk Disease Degenerative disk disease is a condition caused by the changes that occur in the cushions of the backbone (spinal disks) as you grow older. Spinal disks are soft and compressible disks located between the bones of the spine (vertebrae). They act like shock absorbers. Degenerative disk disease can affect the whole spine. However, the neck and lower back are most commonly affected. Many changes can occur in the spinal disks with aging, such as:  The spinal disks may dry and shrink.  Small tears may occur in the tough, outer covering of the disk (annulus).  The disk space may become smaller due to loss of water.  Abnormal growths in the bone (spurs) may occur. This can put pressure on the nerve roots exiting the spinal canal, causing pain.  The spinal canal may become narrowed. CAUSES  Degenerative disk disease is a condition caused by the changes that occur in the spinal disks with aging. The exact cause is not known, but there is a genetic basis for many patients. Degenerative changes can occur due to loss of fluid in the disk. This makes the disk thinner and reduces the space between the backbones. Small cracks can develop in the outer layer of the disk. This can lead to the breakdown of the disk. You are more likely to get degenerative disk disease if you are overweight. Smoking cigarettes and doing heavy work such as weightlifting can also increase your risk of this condition. Degenerative changes can start after a sudden injury. Growth of bone spurs can compress the nerve roots and cause pain.  SYMPTOMS  The symptoms vary from person to person. Some people may have no pain, while  others have severe pain. The pain may be so severe that it can limit your activities. The location of the pain depends on the part of your backbone that is affected. You will have neck or arm pain if a disk in the neck area is affected. You will have pain in your back, buttocks, or legs if a disk in the lower back is affected. The pain becomes worse while bending, reaching up, or with twisting movements. The pain may start gradually and then get worse as time passes. It may also start after a major or minor injury. You may feel numbness or tingling in the arms or legs.  DIAGNOSIS  Your caregiver will ask you about your symptoms and about activities or habits that may cause the pain. He or she may also ask about any injuries, diseases, or treatments you have had earlier. Your caregiver will examine you to check for the range of movement that is possible in the affected area, to check for strength in your extremities, and to check for sensation in the areas of the arms and legs supplied by different nerve roots. An X-ray of the spine may be taken. Your caregiver may suggest other imaging tests, such as magnetic resonance imaging (MRI), if needed.  TREATMENT  Treatment includes rest, modifying your activities, and applying ice and heat. Your caregiver may prescribe medicines to reduce your pain and may ask you to do some exercises to strengthen your back. In some cases, you may need surgery. You and your caregiver will decide on the treatment  that is best for you. HOME CARE INSTRUCTIONS   Follow proper lifting and walking techniques as advised by your caregiver.  Maintain good posture.  Exercise regularly as advised.  Perform relaxation exercises.  Change your sitting, standing, and sleeping habits as advised. Change positions frequently.  Lose weight as advised.  Stop smoking if you smoke.  Wear supportive footwear. SEEK MEDICAL CARE IF:  Your pain does not go away within 1 to 4 weeks. SEEK  IMMEDIATE MEDICAL CARE IF:   Your pain is severe.  You notice weakness in your arms, hands, or legs.  You begin to lose control of your bladder or bowel movements. MAKE SURE YOU:   Understand these instructions.  Will watch your condition.  Will get help right away if you are not doing well or get worse. Document Released: 06/19/2007 Document Revised: 11/14/2011 Document Reviewed: 06/19/2007 [facility] Patient Information 2014 [place], [place].

## 2013-06-27 NOTE — Progress Notes (Signed)
Patient ID: MONISHA SIEBEL, female   DOB: 1956-06-02, 57 y.o.   MRN: [id number] Chief Complaint  Patient presents with  . Follow-up    Left knee still hurting and giving away   BP 122/82  Ht 5\' 6"  (1.676 m)  Wt 185 lb (83.915 kg)  BMI 29.87 kg/m2  Encounter Diagnoses  Name Primary?  . Back pain   . Other spondylosis with radiculopathy, lumbar region Yes  . OA (osteoarthritis) of knee    She presents back with persistent knee pain and leg pain on the left. She does report back symptoms as well  She reports that she has multiple falls in the leg gives way  Her initial history was taken as follows:   Leg Pain       Left leg and knee pain.d/t steeping wrong getting off church van. Referred by Dr. Phillips Odor     History approximately 3 weeks ago the patient stepped out of a church van and twisted her left knee. Since that time she's noted progressive and increasing pain on the medial side of the knee which is described as sharp burning 9/10 unrelieved by 10 mg of hydrocodone. The pain is constant and is on the medial side of the knee she has a morning and night is worse when walking or trying to get up it's better with rest and putting a pillow under the knee. She noted joint effusion some bruising she's had to use a cane to ambulate. The leg will give out as well as mechanical symptoms of catching   She has received intra-articular injection anti-inflammatories and oral narcotic hydrocodone. She says her pain is a 10. I therefore went back and looked at her MRI she has 3 compartment arthritis mild, questionable meniscal tear. I reviewed with her that her symptoms don't match her imaging and therefore advised lumbar spine imaging  X-rays were done today and they show that she is a L5-S1 degenerative disc and L1-L2 degenerative disc and abnormal spinal contour.  We therefore recommend that she undergo physical therapy. Meds ordered this encounter  Medications  . PredniSONE 10 MG KIT   Sig: Take 1 kit (10 mg total) by mouth as directed.    Dispense:  1 kit    Refill:  0    10 mg Dosepak 12 days as directed  . HYDROcodone-acetaminophen (NORCO/VICODIN) 5-325 MG per tablet    Sig: Take 1 tablet by mouth every 8 (eight) hours as needed for pain.    Dispense:  63 tablet    Refill:  0

## 2013-07-08 ENCOUNTER — Ambulatory Visit (HOSPITAL_COMMUNITY)
Admission: RE | Admit: 2013-07-08 | Discharge: 2013-07-08 | Disposition: A | Discharge status: Home / Self Care | Payer: BC Managed Care – PPO | Source: Ambulatory Visit | Attending: Orthopedic Surgery | Admitting: Orthopedic Surgery

## 2013-07-08 DIAGNOSIS — M545 Low back pain, unspecified: Secondary | ICD-10-CM | POA: Insufficient documentation

## 2013-07-08 DIAGNOSIS — IMO0001 Reserved for inherently not codable concepts without codable children: Secondary | ICD-10-CM | POA: Diagnosis not present

## 2013-07-08 DIAGNOSIS — M47816 Spondylosis without myelopathy or radiculopathy, lumbar region: Secondary | ICD-10-CM | POA: Insufficient documentation

## 2013-07-08 NOTE — Evaluation (Signed)
Physical Therapy Evaluation  Patient Details  Name: [name] MRN: [id number] Date of Birth: 1956-06-23  Today's Date: 07/08/2013 Time: 4098-1191 PT Time Calculation (min): 55 min              Visit#: 1 of 8  Re-eval: 08/07/13 Assessment Diagnosis: Lumbar spondylosis Next MD Visit: Dr. Romeo Apple - 09/26/13  Authorization: medicare secondary    Authorization Time Period:    Authorization Visit#: 1 of 10   Past Medical History:  Past Medical History  Diagnosis Date  . Asthma   . Acid reflux   . High cholesterol    Past Surgical History:  Past Surgical History  Procedure Laterality Date  . Foot surgery    . Gallbladder surgery    . Cesarean section    . Abdominal hysterectomy      Question abdominal    Subjective Symptoms/Limitations Symptoms: Pt is a 57 year old female referred to PT for lumbar spondylosis with a significant hx of a fall out of a van at the end of august.  She has complaints of Lt knee and lumbar pain which intensified after her fall to both areas.  She has gained a few lbs since starting the prednisone pack.  She has had a lot of trouble with her balance recently and found that she had inner ear dysfunction.  Also has a significant hx of Recently had increased zoloft  Limitations: Standing;Walking;House hold activities How long can you sit comfortably?: difficult with unsupported back rest.  How long can you stand comfortably?: 5 minutes How long can you walk comfortably?: less than 5 minutes  Patient Stated Goals: decrease pain and get back to living.  Pain Assessment Currently in Pain?: Yes Pain Score: 7  Pain Location: Back (Nature: pulling and sore to touch/bursing) Pain Orientation: Left Pain Type: Chronic pain Pain Onset: More than a month ago Pain Frequency: Constant Pain Relieving Factors: hydrocodosone and prednisone 12 day pack.  Effect of Pain on Daily Activities: difficulty squatting, difficulty standing, difficulty walking,  difficulty going up and down steps, getting out of the dryer, vacuum, sweeping  Precautions/Restrictions     Balance Screening Balance Screen Has the patient fallen in the past 6 months: Yes How many times?: 3 Has the patient had a decrease in activity level because of a fear of falling? : Yes Is the patient reluctant to leave their home because of a fear of falling? : No  Prior Functioning  Prior Function Level of Independence: Independent with transfers Driving: No Vocation: On disability Comments: enjoys   Cognition/Observation Observation/Other Assessments Observations: Lt sacroiliac (SI) dysfunction Other Assessments: Emotionally labile  Sensation/Coordination/Flexibility/Functional Tests Coordination Gross Motor Movements are Fluid and Coordinated: No Coordination and Movement Description: impaired core coordinated movements to transverse abdominus (TrA) and multifidus musculature Flexibility Thomas: Positive 90/90: Positive Functional Tests Functional Tests: FOTO: 46/54 Functional Tests: + Lt ASLR, + FABER  Assessment RLE Strength Right Hip Extension: 3+/5 Right Hip ABduction: 4/5 Right Hip ADduction: 4/5 Right Knee Flexion: 4/5 Right Knee Extension: 4/5 LLE Strength Left Hip Flexion: 3+/5 (w/pain) Left Hip Extension: 4/5 (w/pain) Left Hip ABduction: 4/5 Left Hip ADduction: 4/5 Left Knee Flexion: 4/5 Left Knee Extension: 4/5 Lumbar AROM Lumbar Flexion: decreased 25$ Lumbar Extension: Decreased 10% Lumbar - Right Side Bend: WNL - most pain Lumbar - Left Side Bend: decreased 10% no pain Lumbar - Right Rotation: decreased 50% Lumbar - Left Rotation: decreased 50% -pain Palpation Palpation: atrophy to cervical-lumbar paraspinals  Mobility/Balance  Ambulation/Gait Ambulation/Gait:  Yes Assistive device: Straight cane Gait Pattern: Antalgic;Lateral hip instability;Decreased hip/knee flexion - left Posture/Postural Control Posture/Postural Control:  Postural limitations Postural Limitations: swayback   Exercise/Treatments Stretches Active Hamstring Stretch: 1 rep;30 seconds Piriformis Stretch: 1 rep;30 seconds (supine figure 4) Standing Heel Raises: 10 reps Functional Squats: 5 reps Supine Straight Leg Raise: 5 reps  Physical Therapy Assessment and Plan PT Assessment and Plan Clinical Impression Statement: Pt is a 57 year old female referred to PT for lumbar spondylosis with recent hx of a fall causing back and Lt knee pain with impairments listed below.  At this time pt is becomes emotional during treatment and explaining how losing her mother 8 years ago is effecting her as it is about to be an anniversary of her mother's death.  Explained to pt about the role of anxiety on increased pain and importance of breathing and core stabilization exercises to help control her pain Pt will benefit from skilled therapeutic intervention in order to improve on the following deficits: Abnormal gait;Pain;Decreased strength;Decreased coordination;Decreased balance;Decreased range of motion;Impaired tone;Improper body mechanics;Improper spinal/pelvic alignment;Impaired perceived functional ability;Increased fascial restricitons Rehab Potential: Good PT Frequency: Min 2X/week PT Duration: 6 weeks PT Treatment/Interventions: Stair training;Functional mobility training;Therapeutic activities;Therapeutic exercise;Balance training;Patient/family education;Manual techniques;Modalities;Neuromuscular re-education PT Plan: core stabilization exercises including hooklying SAR overhead, SLR, piriformis stretch, bridiging, ab set) and progress to prone exercises: prone press up, SAR, SLR.  If able: 3rd visit hadd standing activities (squats, side lunges, heel/toe raises)Progress towards therabands by 4th visit    Goals Home Exercise Program Pt/caregiver will Perform Home Exercise Program: Independently PT Goal: Perform Home Exercise Program - Progress: Goal set  today PT Short Term Goals Time to Complete Short Term Goals: 3 weeks PT Short Term Goal 1: Pt will report low back pain/gluteal pain less than 5/10 for 50% of her day.  PT Short Term Goal 2: Pt will improve her core coordinated movements and demonstrate independent TrA and mutlfidus activition to decrease risk of further injury.  PT Short Term Goal 3: Pt will be educated on proper posture and body mechanics.  PT Long Term Goals Time to Complete Long Term Goals:  (6 weeks) PT Long Term Goal 1: Pt will improve her postural strength to [facility] in order to tolerate standing for greater than 15 minutes to complete necessary household ADL's including laundry without increased pain.  PT Long Term Goal 2: Pt will improve her BLE and core flexibility to [facility] in order to perform lumbar AROM to WNL for all directions with pain less than 3/10 in any direction. Long Term Goal 3: Pt will improve her FOTO to status greater than 64% and Limiatiaon less than 36% for improved percieved functional ability.   Problem List Patient Active Problem List   Diagnosis Date Noted  . Low back pain 07/08/2013  . Lumbar spondylosis 07/08/2013  . OA (osteoarthritis) of knee 02/11/2013  . Acute medial meniscus tear of left knee 01/16/2013  . ANXIETY 03/18/2009  . DEPRESSION 03/18/2009  . GERD 03/18/2009  . CONSTIPATION 03/18/2009  . FATTY LIVER DISEASE 03/18/2009  . BILIARY COLIC 03/18/2009  . PANCREATITIS 03/18/2009  . UTI 03/18/2009    PT - End of Session Activity Tolerance: Patient tolerated treatment well General Behavior During Therapy: Anxious PT Plan of Care PT Home Exercise Plan: given PT Patient Instructions: importance of HEP, flexibility, core strength and breathing exercises.  Consulted and Agree with Plan of Care: Patient  GP Functional Assessment Tool Used: FOTO: 46/54 Functional Limitation: Other PT  primary Other PT Primary Current Status [phone]): At least 40 percent but less than 60 percent  impaired, limited or restricted Other PT Primary Goal Status ([id number]): At least 20 percent but less than 40 percent impaired, limited or restricted  Elide Stalzer, MPT, ATC 07/08/2013, 5:56 PM  Physician Documentation Your signature is required to indicate approval of the treatment plan as stated above.  Please sign and either send electronically or make a copy of this report for your files and return this physician signed original.   Please mark one 1.__approve of plan  2. ___approve of plan with the following conditions.   ______________________________                                                          _____________________ Physician Signature                                                                                                             Date

## 2013-07-10 ENCOUNTER — Ambulatory Visit (HOSPITAL_COMMUNITY): Payer: BC Managed Care – PPO

## 2013-07-11 ENCOUNTER — Ambulatory Visit (HOSPITAL_COMMUNITY)
Admission: RE | Admit: 2013-07-11 | Discharge: 2013-07-11 | Disposition: A | Discharge status: Home / Self Care | Payer: BC Managed Care – PPO | Source: Ambulatory Visit | Attending: Internal Medicine | Admitting: Internal Medicine

## 2013-07-11 DIAGNOSIS — M1710 Unilateral primary osteoarthritis, unspecified knee: Secondary | ICD-10-CM

## 2013-07-11 DIAGNOSIS — M47816 Spondylosis without myelopathy or radiculopathy, lumbar region: Secondary | ICD-10-CM

## 2013-07-11 DIAGNOSIS — M545 Low back pain: Secondary | ICD-10-CM | POA: Diagnosis not present

## 2013-07-11 DIAGNOSIS — IMO0001 Reserved for inherently not codable concepts without codable children: Secondary | ICD-10-CM | POA: Diagnosis not present

## 2013-07-11 NOTE — Progress Notes (Signed)
Physical Therapy Treatment Patient Details  Name: VERONIKA HEARD MRN: [id number] Date of Birth: [birth year]  Today's Date: 07/11/2013 Time: 1602-1658 PT Time Calculation (min): 56 min Charge; TE 7829-5621, Manual 3086-5784  Visit#: 2 of 8  Re-eval: 08/07/13 Assessment Diagnosis: Lumbar spondylosis Next MD Visit: Dr. Romeo Apple - 09/26/13  Authorization: medicare secondary  Authorization Time Period:    Authorization Visit#: 2 of 10   Subjective: Symptoms/Limitations Symptoms: Pt stated she has been compliant with HEP and is really sore today.  Reported she has done a lot of housework, feels that increased back pain.   Pain Assessment Currently in Pain?: Yes Pain Score: 7  Pain Location: Back Pain Orientation: Lower;Left  Objective:  Exercise/Treatments Stretches Active Hamstring Stretch: 3 reps;30 seconds Prone on Elbows Stretch: Limitations Prone on Elbows Stretch Limitations: 2 minutes Piriformis Stretch: 2 reps;30 seconds;Other (comment) (knee to opposite shoulder/) Aerobic Tread Mill: 1.2 mph incline 3 following MET Seated Other Seated Lumbar Exercises: anterior/posterior rotation 5x Supine Ab Set: 10 reps;Limitations AB Set Limitations: 10" holds Bridge: 10 reps Straight Leg Raise: 10 reps Prone  Single Arm Raise: 5 reps Straight Leg Raise: 5 reps  Manual Therapy Manual Therapy: Other (comment) Other Manual Therapy: MET for Lt anterior rotation f/b gait training  Physical Therapy Assessment and Plan PT Assessment and Plan Clinical Impression Statement: MET for Lt anterior rotation f/b gait training on TM, pain reduced and improved gait mechanics following manual technique.  Reviewed HEP exercises with min cueing for correct core activation and stability.  Pt educated on benefits of prone position and began prone exercises for lumbar and gluteal strengthening.   PT Plan: Continue with current POC.  Check SI alignment and progress as able. If able begin: 3rd  visit hadd standing activities (squats, side lunges, heel/toe raises)Progress towards therabands by 4th visit    Goals Home Exercise Program Pt/caregiver will Perform Home Exercise Program: Independently PT Short Term Goals Time to Complete Short Term Goals: 3 weeks PT Short Term Goal 1: Pt will report low back pain/gluteal pain less than 5/10 for 50% of her day.  PT Short Term Goal 2: Pt will improve her core coordinated movements and demonstrate independent TrA and mutlfidus activition to decrease risk of further injury.  PT Short Term Goal 2 - Progress: Progressing toward goal PT Short Term Goal 3: Pt will be educated on proper posture and body mechanics.  PT Short Term Goal 3 - Progress: Progressing toward goal PT Long Term Goals Time to Complete Long Term Goals:  (6 weeks) PT Long Term Goal 1: Pt will improve her postural strength to [facility] in order to tolerate standing for greater than 15 minutes to complete necessary household ADL's including laundry without increased pain.  PT Long Term Goal 1 - Progress: Progressing toward goal PT Long Term Goal 2: Pt will improve her BLE and core flexibility to [facility] in order to perform lumbar AROM to WNL for all directions with pain less than 3/10 in any direction. PT Long Term Goal 2 - Progress: Progressing toward goal Long Term Goal 3: Pt will improve her FOTO to status greater than 64% and Limiatiaon less than 36% for improved percieved functional ability.   Problem List Patient Active Problem List   Diagnosis Date Noted  . Low back pain 07/08/2013  . Lumbar spondylosis 07/08/2013  . OA (osteoarthritis) of knee 02/11/2013  . Acute medial meniscus tear of left knee 01/16/2013  . ANXIETY 03/18/2009  . DEPRESSION 03/18/2009  . GERD 03/18/2009  .  CONSTIPATION 03/18/2009  . FATTY LIVER DISEASE 03/18/2009  . BILIARY COLIC 03/18/2009  . PANCREATITIS 03/18/2009  . UTI 03/18/2009    PT - End of Session Activity Tolerance: Patient tolerated  treatment well General Behavior During Therapy: [facility] for tasks assessed/performed  GP    Juel Burrow 07/11/2013, 5:05 PM

## 2013-07-15 ENCOUNTER — Telehealth: Payer: Self-pay | Admitting: Orthopedic Surgery

## 2013-07-15 NOTE — Telephone Encounter (Signed)
[name]  wants a prescription for Hydrocodone  Her # [phone]

## 2013-07-16 ENCOUNTER — Other Ambulatory Visit: Payer: Self-pay | Admitting: Orthopedic Surgery

## 2013-07-16 ENCOUNTER — Ambulatory Visit (HOSPITAL_COMMUNITY)
Admission: RE | Admit: 2013-07-16 | Discharge: 2013-07-16 | Disposition: A | Discharge status: Home / Self Care | Payer: BC Managed Care – PPO | Source: Ambulatory Visit | Attending: Internal Medicine | Admitting: Internal Medicine

## 2013-07-16 DIAGNOSIS — M545 Low back pain, unspecified: Secondary | ICD-10-CM

## 2013-07-16 DIAGNOSIS — M47816 Spondylosis without myelopathy or radiculopathy, lumbar region: Secondary | ICD-10-CM

## 2013-07-16 DIAGNOSIS — M1710 Unilateral primary osteoarthritis, unspecified knee: Secondary | ICD-10-CM

## 2013-07-16 DIAGNOSIS — M179 Osteoarthritis of knee, unspecified: Secondary | ICD-10-CM

## 2013-07-16 DIAGNOSIS — IMO0001 Reserved for inherently not codable concepts without codable children: Secondary | ICD-10-CM | POA: Diagnosis not present

## 2013-07-16 MED ORDER — ACETAMINOPHEN-CODEINE #3 300-30 MG PO TABS: 1.0000 | ORAL_TABLET | ORAL | Status: DC | PRN

## 2013-07-16 NOTE — Telephone Encounter (Signed)
Patient picked up the prescription.

## 2013-07-16 NOTE — Telephone Encounter (Signed)
Tylenol # 3   [this is all I will prescribe for chronic use , if she doesn't accept then see another doctor]

## 2013-07-16 NOTE — Telephone Encounter (Signed)
ready

## 2013-07-16 NOTE — Progress Notes (Signed)
Physical Therapy Treatment Patient Details  Name: [name] MRN: [id number] Date of Birth: Jun 28, 1956  Today's Date: 07/16/2013 Time: 1104-1202 PT Time Calculation (min): 58 min Charges: NMR: x15 minutes TE: x 28 minutes 1 estim/1 ice Visit#: 3 of 8  Re-eval: 08/07/13    Authorization: medicare secondary  Authorization Time Period:    Authorization Visit#: 3 of 10   Subjective: Symptoms/Limitations Symptoms: Pt reports that the manual helped a lot last time.  reports she worked her "to death".  Overall is feeling better. She has decreased pain and is not taking pain medication.  Pt reports she has been working on her posture.  Pain Assessment Currently in Pain?: Yes Pain Score: 5   Precautions/Restrictions     Exercise/Treatments Stretches Active Hamstring Stretch: 3 reps;30 seconds;Limitations Active Hamstring Stretch Limitations: BLE Single Knee to Chest Stretch: 2 reps;30 seconds;Limitations Single Knee to Chest Stretch Limitations: BLE Prone on Elbows Stretch: Limitations Prone on Elbows Stretch Limitations: 3 minutes Seated Other Seated Lumbar Exercises: dyna disc: Anterior postior and side to side with PT facilition and 2 minutes eac Supine Ab Set: 10 reps;Limitations AB Set Limitations: 10" holds w/max VC using breathing for TrA contraction Bridge: 10 reps;5 seconds;Limitations Bridge Limitations: hip add squezze Straight Leg Raise: 2 seconds Sidelying Hip Abduction: 10 reps;Limitations Hip Abduction Limitations: BLE w/PT facilation Other Sidelying Lumbar Exercises: Hip circles: forward x10 reps, backwards 10 reps BLE Prone  Single Arm Raise: Right;Left;5 reps Straight Leg Raise: 10 reps;Limitations Straight Leg Raises Limitations: BLE with PT facilation Other Prone Lumbar Exercises: Hip IR and ER w/manual restiance x10 reps; Iso hip IR 5x5 sec holds BLE  Modalities Modalities: Electrical Stimulation;Cryotherapy Manual Therapy Manual Therapy:  Other (comment) Other Manual Therapy: No MET required Cryotherapy Number Minutes Cryotherapy: 15 Minutes Cryotherapy Location: Back Electrical Stimulation Electrical Stimulation Location: lumosacral region Electrical Stimulation Action: Prone Interferential electrical stimulation (IFES) x 20 volts x15 minutes  Electrical Stimulation Goals: Pain  Physical Therapy Assessment and Plan PT Assessment and Plan Clinical Impression Statement: SI joint in alignment today.  Continued to progress core stabilization exercises and encouraged pt with faster movements to improve muscle function.  Pt requires mod cueing for proper posture at end of session.  Has notable decreased coordinated movements with requires PT faciliation to hip IR and gluteus medius strengthening which improves with increased reps, however then becomes weak.  Requires NMR throughout session for proper coordinated movements. Responds best with forceful exhale technique to activate TrA musculature. Added IFES at end of session with ice to decrease delayed onset muscle soreness and reduce swelling to the region.  PT Plan: F/U on estim. Continue with add standing theraband, squats, side lunges, heel and toe raises.  Continue to progress core stabilization exercises: add when able bent knee raises and supine clams.  S/L clams 10 sec holds    Goals Home Exercise Program Pt/caregiver will Perform Home Exercise Program: Independently PT Goal: Perform Home Exercise Program - Progress: Progressing toward goal PT Short Term Goals Time to Complete Short Term Goals: 3 weeks PT Short Term Goal 1: Pt will report low back pain/gluteal pain less than 5/10 for 50% of her day.  PT Short Term Goal 1 - Progress: Met PT Short Term Goal 2: Pt will improve her core coordinated movements and demonstrate independent TrA and mutlfidus activition to decrease risk of further injury.  PT Short Term Goal 2 - Progress: Progressing toward goal PT Short Term Goal  3: Pt will be educated on proper  posture and body mechanics.  PT Short Term Goal 3 - Progress: Progressing toward goal PT Long Term Goals PT Long Term Goal 1: Pt will improve her postural strength to [facility] in order to tolerate standing for greater than 15 minutes to complete necessary household ADL's including laundry without increased pain.  PT Long Term Goal 2: Pt will improve her BLE and core flexibility to [facility] in order to perform lumbar AROM to WNL for all directions with pain less than 3/10 in any direction. PT Long Term Goal 2 - Progress: Progressing toward goal Long Term Goal 3: Pt will improve her FOTO to status greater than 64% and Limiatiaon less than 36% for improved percieved functional ability.  Long Term Goal 3 Progress: Progressing toward goal  Problem List Patient Active Problem List   Diagnosis Date Noted  . Low back pain 07/08/2013  . Lumbar spondylosis 07/08/2013  . OA (osteoarthritis) of knee 02/11/2013  . Acute medial meniscus tear of left knee 01/16/2013  . ANXIETY 03/18/2009  . DEPRESSION 03/18/2009  . GERD 03/18/2009  . CONSTIPATION 03/18/2009  . FATTY LIVER DISEASE 03/18/2009  . BILIARY COLIC 03/18/2009  . PANCREATITIS 03/18/2009  . UTI 03/18/2009    PT - End of Session Activity Tolerance: Patient tolerated treatment well General Behavior During Therapy: [facility] for tasks assessed/performed  GP    Jamario Colina, MPT, ATC 07/16/2013, 12:04 PM

## 2013-07-16 NOTE — Telephone Encounter (Signed)
Patient informed of Dr. Harrison's reply. 

## 2013-07-18 ENCOUNTER — Ambulatory Visit (HOSPITAL_COMMUNITY)
Admission: RE | Admit: 2013-07-18 | Discharge: 2013-07-18 | Disposition: A | Discharge status: Home / Self Care | Payer: BC Managed Care – PPO | Source: Ambulatory Visit | Attending: Internal Medicine | Admitting: Internal Medicine

## 2013-07-18 DIAGNOSIS — M179 Osteoarthritis of knee, unspecified: Secondary | ICD-10-CM

## 2013-07-18 DIAGNOSIS — M545 Low back pain, unspecified: Secondary | ICD-10-CM

## 2013-07-18 DIAGNOSIS — M47816 Spondylosis without myelopathy or radiculopathy, lumbar region: Secondary | ICD-10-CM

## 2013-07-18 DIAGNOSIS — IMO0001 Reserved for inherently not codable concepts without codable children: Secondary | ICD-10-CM | POA: Diagnosis not present

## 2013-07-18 DIAGNOSIS — M1710 Unilateral primary osteoarthritis, unspecified knee: Secondary | ICD-10-CM

## 2013-07-18 NOTE — Progress Notes (Signed)
Physical Therapy Treatment Patient Details  Name: [name] MRN: [id number] Date of Birth: 08/23/1956  Today's Date: 07/18/2013 Time: 0865-7846 PT Time Calculation (min): 85 min Charge: TE 9629-5284, NMR 1324-4010, Manual 1135-1148, Estim with ice x 15 1150-1205  Visit#: 4 of 8  Re-eval: 08/07/13 Assessment Diagnosis: Lumbar spondylosis Next MD Visit: Dr. Romeo Apple - 09/26/13  Authorization: medicare secondary  Authorization Time Period:    Authorization Visit#: 4 of 10   Subjective: Symptoms/Limitations Symptoms: Pt reported inner ear problems resulting in difficulty with balance, reports a fall last night slipping on spilled drink.  Stated increased LBP and buttocks pain following fall. Pain Assessment Currently in Pain?: Yes Pain Score: 8   Objective:   Exercise/Treatments Aerobic Tread Mill: 1.2 mph incline 3 following MET x 5 minutes Standing Heel Raises: 10 reps;Limitations Heel Raises Limitations: Toe raises 10x Functional Squats: 10 reps Lifting: Limitations Lifting Limitations: 3 reps Scapular Retraction: Both;10 reps;Theraband Theraband Level (Scapular Retraction): Level 3 (Green) Row: Both;10 reps;Theraband Theraband Level (Row): Level 3 (Green) Shoulder Extension: Both;10 reps;Theraband Theraband Level (Shoulder Extension): Level 3 (Green) Other Standing Lumbar Exercises: L 60" with HHA, Rt 6" Other Standing Lumbar Exercises: tandem stance on 6in step 1x 30" with c/o dizziness Seated Other Seated Lumbar Exercises: dyna disc: Anterior postior and side to side with PT facilition and 2 minutes eac Other Seated Lumbar Exercises: shoulder rolls and  wback 10x  Modalities Modalities: Electrical Stimulation;Cryotherapy Manual Therapy Manual Therapy: Other (comment) Other Manual Therapy: Lt anterior rotation f/b gait training on TM Cryotherapy Number Minutes Cryotherapy: 15 Minutes Cryotherapy Location: Back Type of Cryotherapy: Ice pack Development worker, international aid Location: lumosacral region Statistician Action: Prone Interferential electrical stimulation (IFES) x 20 volts x15 minutes Electrical Stimulation Goals: Pain  Physical Therapy Assessment and Plan PT Assessment and Plan Clinical Impression Statement: Progressed to standing activity for LE strengthening and began postural strengthening exercises.  Added balance activities this session following fall pt had yesterday.  Pt with multiple rest breaks required due to dizziness.  MET complete for Lt anterior rotation f/b gait training to set SI alignment.  Pt reported pain reduction to 3/10 following MET with improve hip mobilty and improved gait mechanics.  Ended session wth estim and ice for pain control in prone position. PT Plan: Continue with standing postural strengthening and LE strengthennig, balance activiteis and manual techniques as needed, estim/ice for pain control.  Next session begin side lunges and progress balance activities with side stepping and retro gait.  Continue to progress core stabilization exercises: add when able bent knee raises and supine clams.  S/L clams 10 sec holds    Goals Home Exercise Program Pt/caregiver will Perform Home Exercise Program: Independently PT Short Term Goals Time to Complete Short Term Goals: 3 weeks PT Short Term Goal 1: Pt will report low back pain/gluteal pain less than 5/10 for 50% of her day.  PT Short Term Goal 2: Pt will improve her core coordinated movements and demonstrate independent TrA and mutlfidus activition to decrease risk of further injury.  PT Short Term Goal 2 - Progress: Progressing toward goal PT Short Term Goal 3: Pt will be educated on proper posture and body mechanics.  PT Short Term Goal 3 - Progress: Progressing toward goal PT Long Term Goals PT Long Term Goal 1: Pt will improve her postural strength to [facility] in order to tolerate standing for greater than 15 minutes to complete  necessary household ADL's including laundry without increased pain.  PT Long Term Goal 1 - Progress: Progressing toward goal PT Long Term Goal 2: Pt will improve her BLE and core flexibility to [facility] in order to perform lumbar AROM to WNL for all directions with pain less than 3/10 in any direction. Long Term Goal 3: Pt will improve her FOTO to status greater than 64% and Limiatiaon less than 36% for improved percieved functional ability.   Problem List Patient Active Problem List   Diagnosis Date Noted  . Low back pain 07/08/2013  . Lumbar spondylosis 07/08/2013  . OA (osteoarthritis) of knee 02/11/2013  . Acute medial meniscus tear of left knee 01/16/2013  . ANXIETY 03/18/2009  . DEPRESSION 03/18/2009  . GERD 03/18/2009  . CONSTIPATION 03/18/2009  . FATTY LIVER DISEASE 03/18/2009  . BILIARY COLIC 03/18/2009  . PANCREATITIS 03/18/2009  . UTI 03/18/2009    PT - End of Session Activity Tolerance: Patient tolerated treatment well General Behavior During Therapy: [facility] for tasks assessed/performed  GP    Juel Burrow 07/18/2013, 12:08 PM

## 2013-07-19 ENCOUNTER — Telehealth: Payer: Self-pay | Admitting: *Deleted

## 2013-07-19 NOTE — Telephone Encounter (Signed)
Patient called c/o Tylenol #3 makes her feel drunk. I advised her that drowsiness was a side effect with all pain medication. I advised her to try OTC meds during the day and take that at night.

## 2013-07-23 ENCOUNTER — Encounter (HOSPITAL_COMMUNITY): Payer: Self-pay | Admitting: Emergency Medicine

## 2013-07-23 ENCOUNTER — Emergency Department (HOSPITAL_COMMUNITY)
Admission: EM | Admit: 2013-07-23 | Discharge: 2013-07-23 | Disposition: A | Discharge status: Home / Self Care | Payer: BC Managed Care – PPO | Attending: Emergency Medicine | Admitting: Emergency Medicine

## 2013-07-23 ENCOUNTER — Ambulatory Visit (HOSPITAL_COMMUNITY)
Admission: RE | Admit: 2013-07-23 | Discharge: 2013-07-23 | Disposition: A | Discharge status: Home / Self Care | Payer: BC Managed Care – PPO | Source: Ambulatory Visit | Attending: Orthopedic Surgery | Admitting: Orthopedic Surgery

## 2013-07-23 ENCOUNTER — Emergency Department (HOSPITAL_COMMUNITY): Payer: BC Managed Care – PPO

## 2013-07-23 ENCOUNTER — Other Ambulatory Visit: Payer: Self-pay

## 2013-07-23 DIAGNOSIS — M545 Low back pain: Secondary | ICD-10-CM

## 2013-07-23 DIAGNOSIS — R51 Headache: Secondary | ICD-10-CM | POA: Insufficient documentation

## 2013-07-23 DIAGNOSIS — M1710 Unilateral primary osteoarthritis, unspecified knee: Secondary | ICD-10-CM

## 2013-07-23 DIAGNOSIS — R296 Repeated falls: Secondary | ICD-10-CM | POA: Insufficient documentation

## 2013-07-23 DIAGNOSIS — R5381 Other malaise: Secondary | ICD-10-CM | POA: Insufficient documentation

## 2013-07-23 DIAGNOSIS — Z7982 Long term (current) use of aspirin: Secondary | ICD-10-CM | POA: Insufficient documentation

## 2013-07-23 DIAGNOSIS — S8990XA Unspecified injury of unspecified lower leg, initial encounter: Secondary | ICD-10-CM | POA: Diagnosis not present

## 2013-07-23 DIAGNOSIS — IMO0002 Reserved for concepts with insufficient information to code with codable children: Secondary | ICD-10-CM | POA: Diagnosis not present

## 2013-07-23 DIAGNOSIS — Z880 Allergy status to penicillin: Secondary | ICD-10-CM | POA: Insufficient documentation

## 2013-07-23 DIAGNOSIS — N390 Urinary tract infection, site not specified: Secondary | ICD-10-CM | POA: Insufficient documentation

## 2013-07-23 DIAGNOSIS — Z79899 Other long term (current) drug therapy: Secondary | ICD-10-CM | POA: Diagnosis not present

## 2013-07-23 DIAGNOSIS — J45909 Unspecified asthma, uncomplicated: Secondary | ICD-10-CM | POA: Insufficient documentation

## 2013-07-23 DIAGNOSIS — Y9389 Activity, other specified: Secondary | ICD-10-CM | POA: Diagnosis not present

## 2013-07-23 DIAGNOSIS — E7800 Pure hypercholesterolemia, unspecified: Secondary | ICD-10-CM | POA: Insufficient documentation

## 2013-07-23 DIAGNOSIS — IMO0001 Reserved for inherently not codable concepts without codable children: Secondary | ICD-10-CM | POA: Diagnosis not present

## 2013-07-23 DIAGNOSIS — K219 Gastro-esophageal reflux disease without esophagitis: Secondary | ICD-10-CM | POA: Insufficient documentation

## 2013-07-23 DIAGNOSIS — F411 Generalized anxiety disorder: Secondary | ICD-10-CM | POA: Insufficient documentation

## 2013-07-23 DIAGNOSIS — Z9181 History of falling: Secondary | ICD-10-CM | POA: Diagnosis not present

## 2013-07-23 DIAGNOSIS — Y9289 Other specified places as the place of occurrence of the external cause: Secondary | ICD-10-CM | POA: Diagnosis not present

## 2013-07-23 DIAGNOSIS — R531 Weakness: Secondary | ICD-10-CM

## 2013-07-23 DIAGNOSIS — M47816 Spondylosis without myelopathy or radiculopathy, lumbar region: Secondary | ICD-10-CM

## 2013-07-23 DIAGNOSIS — M549 Dorsalgia, unspecified: Secondary | ICD-10-CM

## 2013-07-23 HISTORY — DX: Acute pancreatitis without necrosis or infection, unspecified: K85.90

## 2013-07-23 HISTORY — DX: Repeated falls: R29.6

## 2013-07-23 LAB — RAPID URINE DRUG SCREEN, HOSP PERFORMED
Barbiturates: POSITIVE — AB
Benzodiazepines: POSITIVE — AB
Cocaine: NOT DETECTED
Opiates: POSITIVE — AB

## 2013-07-23 LAB — URINALYSIS, ROUTINE W REFLEX MICROSCOPIC
Bilirubin Urine: NEGATIVE
Hgb urine dipstick: NEGATIVE
Nitrite: POSITIVE — AB
Specific Gravity, Urine: >1.03 — ABNORMAL HIGH (ref 1.005–1.030)
pH: 5.5 (ref 5.0–8.0)

## 2013-07-23 LAB — CBC WITH DIFFERENTIAL/PLATELET
Basophils Absolute: 0 10*3/uL (ref 0.0–0.1)
HCT: 35.5 % — ABNORMAL LOW (ref 36.0–46.0)
Hemoglobin: 11.1 g/dL — ABNORMAL LOW (ref 12.0–15.0)
Lymphocytes Relative: 30 % (ref 12–46)
Monocytes Absolute: 0.3 10*3/uL (ref 0.1–1.0)
Monocytes Relative: 5 % (ref 3–12)
Neutro Abs: 3.8 10*3/uL (ref 1.7–7.7)
WBC: 6.2 10*3/uL (ref 4.0–10.5)

## 2013-07-23 LAB — URINE MICROSCOPIC-ADD ON

## 2013-07-23 LAB — COMPREHENSIVE METABOLIC PANEL
BUN: 7 mg/dL (ref 6–23)
Calcium: 9.8 mg/dL (ref 8.4–10.5)
Creatinine, Ser: 0.62 mg/dL (ref 0.50–1.10)
GFR calc Af Amer: >90 mL/min (ref >90)
Glucose, Bld: 136 mg/dL — ABNORMAL HIGH (ref 70–99)
Total Protein: 6.8 g/dL (ref 6.0–8.3)

## 2013-07-23 MED ORDER — NITROFURANTOIN MONOHYD MACRO 100 MG PO CAPS: 100.0000 mg | ORAL_CAPSULE | Freq: Two times a day (BID) | ORAL | Status: DC

## 2013-07-23 MED ORDER — NITROFURANTOIN MONOHYD MACRO 100 MG PO CAPS
100.0000 mg | ORAL_CAPSULE | Freq: Once | ORAL | Status: AC
  Administered 2013-07-23: 100 mg via ORAL
  Filled 2013-07-23: qty 1

## 2013-07-23 MED ORDER — HYDROCODONE-ACETAMINOPHEN 5-325 MG PO TABS: 2.0000 | ORAL_TABLET | ORAL | Status: DC | PRN

## 2013-07-23 NOTE — Progress Notes (Signed)
Physical Therapy Treatment Patient Details  Name: [name] MRN: [id number] Date of Birth: 06/13/1956  Today's Date: 07/23/2013 Time: 1110-1206 PT Time Calculation (min): 56 min Charges:  [id number] Estim/Ice: 6295-2841 Visit#: 5 of 8  Re-eval: 08/07/13    Authorization: medicare secondary  Authorization Time Period:    Authorization Visit#: 5 of 10   Subjective: Symptoms/Limitations Symptoms: Pt reports that she is feeling very off balance and feels it may be related to her medication.  Pt reports that her arm has been numb off and on and has tremors in her arms. "I wanted to come to therapy because I always feel better after therapy." Pain Assessment Currently in Pain?: No/denies Pain Score: 5  Pain Location: Back  Precautions/Restrictions     Exercise/Treatments Mobility/Balance        Stretches Active Hamstring Stretch: 3 reps;30 seconds;Limitations Active Hamstring Stretch Limitations: BLE, seated Lower Trunk Rotation: 5 reps;20 seconds Aerobic Elliptical: NuStep: x10 minutes hills #2, resistance 3 Machines for Strengthening   Standing   Seated   Supine Bent Knee Raise: 10 reps Bridge: 10 reps Sidelying   Prone    Quadruped    Modalities Modalities: Electrical Stimulation;Cryotherapy Manual Therapy Manual Therapy: Other (comment) Cryotherapy Number Minutes Cryotherapy: 15 Minutes Cryotherapy Location: Back Electrical Stimulation Electrical Stimulation Location: lumosacral region Electrical Stimulation Action: Prone Interferential electrical stimulation (IFES) x 31 volts x15 minutes Electrical Stimulation Goals: Pain  Physical Therapy Assessment and Plan PT Assessment and Plan Clinical Impression Statement: Pt able to complete all exercises and had decreased pain.  Pt has significant ridgidity at end of session and had increased difficutly going from prone to supine and requires mod A.  Encouraged pt and her sister to contact Dr.  Sherwood Gambler to discuss her current state.   PT Plan: F/U with MD and see if pt is able.     Goals    Problem List Patient Active Problem List   Diagnosis Date Noted  . Low back pain 07/08/2013  . Lumbar spondylosis 07/08/2013  . OA (osteoarthritis) of knee 02/11/2013  . Acute medial meniscus tear of left knee 01/16/2013  . ANXIETY 03/18/2009  . DEPRESSION 03/18/2009  . GERD 03/18/2009  . CONSTIPATION 03/18/2009  . FATTY LIVER DISEASE 03/18/2009  . BILIARY COLIC 03/18/2009  . PANCREATITIS 03/18/2009  . UTI 03/18/2009    PT - End of Session Activity Tolerance: Patient tolerated treatment well General Behavior During Therapy: [facility] for tasks assessed/performed  GP    Clemens Lachman 07/23/2013, 4:53 PM

## 2013-07-23 NOTE — ED Notes (Signed)
C/o weakness and family member states that she had 2 falls this morning, c/o left arm pain, pt not able to state if she hit her head, pt able to state name, place, month and year; per family member pt is not herself "she is out of it", slurred speech and drowsy

## 2013-07-23 NOTE — ED Provider Notes (Signed)
CSN: [id number]     Arrival date & time 07/23/13  1233 History   First MD Initiated Contact with Patient 07/23/13 1319     Chief Complaint  Patient presents with  . Fall    HPI  Patient presents here with her sister-in-law. She has a history of knee pain and back pain. An MRI of her knee there was with minimal findings. Her orthopedist is recommended physical therapy and MRI of her back. She has had an open MRI and has not had this as yet. She was taking hydrocodone. On Thursday her appointment this was changed to Tylenol codeine. She states that she's taking one approximately 3 times a day. She also takes Xanax a milligram 3 times a day as needed for anxiety and Neurontin total of 1200 mg daily. This morning she had 2 episodes where she fell or ended up on the floor wanted her knees and 100 left side. She does not feel weak with her bilateral extremities. At times she feels her left knee is too painful to walk on. She seemed to sedate her sleepy according to the family member this year. Family went to pick her up. On arrival the patient told her that she had fallen. He went to physical therapy. He didn't seem somewhat somnolent and thus she presents here. She denies headache. No neck or upper extremity symptoms. No difficulty breathing no abdominal pain nausea vomiting or other physical complaints.  Past Medical History  Diagnosis Date  . Asthma   . Acid reflux   . High cholesterol   . Falls frequently   . Pancreatitis, acute    Past Surgical History  Procedure Laterality Date  . Foot surgery    . Gallbladder surgery    . Cesarean section    . Abdominal hysterectomy      Question abdominal   History reviewed. No pertinent family history. History  Substance Use Topics  . Smoking status: Never Smoker   . Smokeless tobacco: Not on file  . Alcohol Use: No   OB History   Grav Para Term Preterm Abortions TAB SAB Ect Mult Living                 Review of Systems  Constitutional:  Positive for activity change. Negative for fever, chills, diaphoresis, appetite change and fatigue.  HENT: Negative for mouth sores, sore throat and trouble swallowing.   Eyes: Negative for visual disturbance.  Respiratory: Negative for cough, chest tightness, shortness of breath and wheezing.   Cardiovascular: Negative for chest pain.  Gastrointestinal: Negative for nausea, vomiting, abdominal pain, diarrhea and abdominal distention.  Endocrine: Negative for polydipsia, polyphagia and polyuria.  Genitourinary: Negative for dysuria, frequency and hematuria.  Musculoskeletal: Positive for arthralgias, back pain and myalgias. Negative for gait problem.       Left knee pain. Left lower back pain.  Skin: Negative for color change, pallor and rash.  Neurological: Negative for dizziness, syncope, light-headedness and headaches.  Hematological: Does not bruise/bleed easily.  Psychiatric/Behavioral: Positive for behavioral problems and decreased concentration. Negative for confusion.    Allergies  Diclofenac potassium; Penicillins; Sulfonamide derivatives; and Tetracycline  Home Medications   Current Outpatient Rx  Name  Route  Sig  Dispense  Refill  . acetaminophen-codeine (TYLENOL #3) 300-30 MG per tablet   Oral   Take 1 tablet by mouth every 4 (four) hours as needed for moderate pain.   60 tablet   5   . albuterol (PROVENTIL) 2 MG tablet  Oral   Take 2 mg by mouth 3 (three) times daily.         Marland Kitchen allopurinol (ZYLOPRIM) 300 MG tablet   Oral   Take 1 tablet by mouth daily.         Marland Kitchen ALPRAZolam (XANAX) 1 MG tablet   Oral   Take 1 tablet by mouth 4 (four) times daily as needed for anxiety.          Marland Kitchen aspirin EC 81 MG tablet   Oral   Take 81 mg by mouth daily.         . butalbital-acetaminophen-caffeine (FIORICET, ESGIC) 50-325-40 MG per tablet   Oral   Take 1 tablet by mouth 4 (four) times daily as needed for headache.          . citalopram (CELEXA) 40 MG tablet    Oral   Take 1 tablet by mouth daily.         . furosemide (LASIX) 20 MG tablet   Oral   Take 1 tablet by mouth daily.         Marland Kitchen gabapentin (NEURONTIN) 400 MG capsule   Oral   Take 1 capsule by mouth 3 (three) times daily.         Marland Kitchen omeprazole (PRILOSEC) 40 MG capsule   Oral   Take 1 capsule by mouth daily.         . pravastatin (PRAVACHOL) 80 MG tablet   Oral   Take 1 tablet by mouth daily.         . sertraline (ZOLOFT) 100 MG tablet   Oral   Take 150 mg by mouth daily.         Marland Kitchen HYDROcodone-acetaminophen (NORCO/VICODIN) 5-325 MG per tablet   Oral   Take 2 tablets by mouth every 4 (four) hours as needed.   10 tablet   0   . nitrofurantoin, macrocrystal-monohydrate, (MACROBID) 100 MG capsule   Oral   Take 1 capsule (100 mg total) by mouth 2 (two) times daily.   20 capsule   0    BP 108/76  Pulse 90  Temp(Src) 98.2 F (36.8 C) (Oral)  Resp 16  SpO2 95% Physical Exam  Constitutional: She is oriented to person, place, and time. No distress.  Appears slightly lower than her stated age. She does) eyes but opens them to my voice. She follows conversation. She is accurate, yet slow and methodical with her answers.   HENT:  Head: Normocephalic.  Atraumatic. No soft tissue swelling. No blood over the TMs mastoids ears nose or mouth  Eyes: Conjunctivae are normal. Pupils are equal, round, and reactive to light. No scleral icterus.  Neck: Normal range of motion. Neck supple. No thyromegaly present.  Nontender in the midline cervical thoracic spine  Cardiovascular: Normal rate, regular rhythm, S1 normal and S2 normal.  Exam reveals no gallop and no friction rub.   No murmur heard. As rhythm on the monitor  Pulmonary/Chest: Effort normal and breath sounds normal. No respiratory distress. She has no wheezes. She has no rales.  Abdominal: Soft. Bowel sounds are normal. She exhibits no distension. There is no tenderness. There is no rebound.  Musculoskeletal:  Normal range of motion.  Neurological: She is alert and oriented to person, place, and time.  Normal strength to the upper extremities. No pronator drift. Normal symmetric intact cranial nerves. She has slightly decreased strength the left lower extremity on exam. This seemed to be more related to pain. She is  able to lift the straight leg off the bed and hold 5 seconds slightly better on the right than on the left. Her Achilles reflexes are 1-2+ and symmetric.  Skin: Skin is warm and dry. No rash noted.  Psychiatric: She has a normal mood and affect. Her behavior is normal.    ED Course  Procedures (including critical care time) Labs Review Labs Reviewed  COMPREHENSIVE METABOLIC PANEL - Abnormal; Notable for the following:    Glucose, Bld 136 (*)    Total Bilirubin 0.2 (*)    All other components within normal limits  CBC WITH DIFFERENTIAL - Abnormal; Notable for the following:    Hemoglobin 11.1 (*)    HCT 35.5 (*)    All other components within normal limits  URINALYSIS, ROUTINE W REFLEX MICROSCOPIC - Abnormal; Notable for the following:    APPearance CLOUDY (*)    Specific Gravity, Urine >1.030 (*)    Nitrite POSITIVE (*)    Leukocytes, UA TRACE (*)    All other components within normal limits  URINE RAPID DRUG SCREEN (HOSP PERFORMED) - Abnormal; Notable for the following:    Opiates POSITIVE (*)    Benzodiazepines POSITIVE (*)    Barbiturates POSITIVE (*)    All other components within normal limits  URINE MICROSCOPIC-ADD ON - Abnormal; Notable for the following:    Squamous Epithelial / LPF MANY (*)    Bacteria, UA MANY (*)    Casts GRANULAR CAST (*)    Crystals CA OXALATE CRYSTALS (*)    All other components within normal limits  URINE CULTURE  ACETAMINOPHEN LEVEL   Imaging Review Ct Head Wo Contrast  07/23/2013   CLINICAL DATA:  Headache.  EXAM: CT HEAD WITHOUT CONTRAST  TECHNIQUE: Contiguous axial images were obtained from the base of the skull through the vertex  without intravenous contrast.  COMPARISON:  CT scan of October 29, 2007.  FINDINGS: Bony calvarium appears intact. Minimal diffuse cortical atrophy is noted. No mass effect or midline shift is noted. Ventricular size is within normal limits. There is no evidence of mass lesion, hemorrhage or acute infarction.  IMPRESSION: No acute intracranial abnormality seen.   Electronically Signed   By: Roque Lias M.D.   On: 07/23/2013 14:41    EKG Interpretation   None       MDM   1. Weakness   2. Urinary tract infection   3. Back pain      Differential diagnosis medication effect sedation related to recent medication change. No volume loss suggest dehydration hypernatremia. We will check her rectal as per she's had a fall. We'll obtain a CAT scan of her head. Discussed with her about obtaining an MRI. I don't think this is absolutely necessary today but is pending for her as an outpatient. She politely declined saying she cannot undergo this without sedation because of the claustrophobia". She normally goes to [facility] for an open MRI. Her symptoms and findings do not suggest acute herniated nucleus or cauda equina syndrome.  Her urine appears infected. Her CT is normal. Her sodium is normal. Some of her sedation as to the Tylenol with Codeine. We'll treat her for urinary tract infection. She'll followup with her physician either primary care orthopedist regarding open air MRI. No signs of acute herniated nucleus or cauda equina here.    Roney Marion, MD 07/23/13 [phone]

## 2013-07-24 ENCOUNTER — Other Ambulatory Visit: Payer: Self-pay | Admitting: *Deleted

## 2013-07-24 ENCOUNTER — Telehealth: Payer: Self-pay | Admitting: Orthopedic Surgery

## 2013-07-24 NOTE — Telephone Encounter (Signed)
FYI... I returned call to patient, and she informed me she had gone to the Emergency Room on 07/23/13.(Notes in [facility]) She states that the ER physician advised her to stop Tylenol #3 due to the lethargic reaction she was having from it. She said he put her back on the Hydrocodone 5/325. She was also treated for a UTI. She requested that I add Tylenol #3 as an allergy in her chart, in which I did.

## 2013-07-24 NOTE — Telephone Encounter (Signed)
[name] asked for you to call her  [phone]

## 2013-07-25 ENCOUNTER — Ambulatory Visit (HOSPITAL_COMMUNITY): Payer: BC Managed Care – PPO

## 2013-07-25 LAB — URINE CULTURE

## 2013-07-26 ENCOUNTER — Telehealth (HOSPITAL_COMMUNITY): Payer: Self-pay | Admitting: Emergency Medicine

## 2013-07-26 NOTE — ED Notes (Signed)
Post ED Visit - Positive Culture Follow-up: Successful Patient Follow-Up  Culture assessed and recommendations reviewed by: []  Wes Dulaney, Pharm.D., BCPS []  Celedonio Miyamoto, [address].D., BCPS []  Georgina Pillion, Pharm.D., BCPS []  [place], [address].D., BCPS, AAHIVP [x]  Estella Husk, Pharm.D., BCPS, AAHIVP  Positive urine culture  []  Patient discharged without antimicrobial prescription and treatment is now indicated [x]  Organism is resistant to prescribed ED discharge antimicrobial []  Patient with positive blood cultures  Changes discussed with ED provider: Trixie Dredge PA-C New antibiotic prescription: Cipro 500 mg PO BID x 3 days    Kylie A Holland 07/26/2013, 12:07 PM

## 2013-07-26 NOTE — Progress Notes (Signed)
ED Antimicrobial Stewardship Positive Culture Follow Up   [name] is an 57 y.o. female who presented to [facility] on 07/23/2013 with a chief complaint of  Chief Complaint  Patient presents with  . Fall    Recent Results (from the past 720 hour(s))  URINE CULTURE     Status: None   Collection Time    07/23/13  3:50 PM      Result Value Range Status   Specimen Description URINE, CLEAN CATCH   Final   Special Requests NONE   Final   Culture  Setup Time     Final   Value: 07/24/2013 00:58     Performed at [facility] Count     Final   Value: >=100,000 COLONIES/ML     Performed at [facility]   Culture     Final   Value: KLEBSIELLA PNEUMONIAE     Performed at [facility]   Report Status 07/25/2013 FINAL   Final   Organism ID, Bacteria KLEBSIELLA PNEUMONIAE   Final    [x]  Treated with Nitrofurantoin, organism resistant to prescribed antimicrobial  New antibiotic prescription: Cipro 500mg  PO BID x 3 days.  Stop Nitrofurantoin.  ED Provider: Trixie Dredge, PA-C   Sallee Provencal 07/26/2013, 10:42 AM Infectious Diseases Pharmacist Phone# [phone]

## 2013-07-29 ENCOUNTER — Telehealth: Payer: Self-pay | Admitting: *Deleted

## 2013-07-29 NOTE — Telephone Encounter (Signed)
Patient called and states she only received 5 hydrocodone 5/325 mg  from the ER and wants to know if you will refill it. She is aware that she will have to pick up. [phone] patient's #

## 2013-07-30 ENCOUNTER — Ambulatory Visit (HOSPITAL_COMMUNITY)
Admission: RE | Admit: 2013-07-30 | Discharge: 2013-07-30 | Disposition: A | Discharge status: Home / Self Care | Payer: BC Managed Care – PPO | Source: Ambulatory Visit | Attending: Internal Medicine | Admitting: Internal Medicine

## 2013-07-30 DIAGNOSIS — M1710 Unilateral primary osteoarthritis, unspecified knee: Secondary | ICD-10-CM

## 2013-07-30 DIAGNOSIS — M545 Low back pain, unspecified: Secondary | ICD-10-CM

## 2013-07-30 DIAGNOSIS — M179 Osteoarthritis of knee, unspecified: Secondary | ICD-10-CM

## 2013-07-30 DIAGNOSIS — IMO0001 Reserved for inherently not codable concepts without codable children: Secondary | ICD-10-CM | POA: Diagnosis not present

## 2013-07-30 DIAGNOSIS — M47816 Spondylosis without myelopathy or radiculopathy, lumbar region: Secondary | ICD-10-CM

## 2013-07-30 NOTE — Telephone Encounter (Signed)
I had sent you a FYI note where the patient went to the ED on 07/23/13, and ED doctor advised her not to take Tylenol with codeine. He put her back on Hydrocodone 5/325 mg, however he only gave her 10 tablets. I know you had said for her to take ibuprofen, but patient states she can not take that due to pancreas failure. Patient is requesting refill for hydrocodone. Patient states she is still having a lot of knee and back pain. She is currently still doing PT at [facility]. Please advise.

## 2013-07-30 NOTE — Progress Notes (Signed)
Physical Therapy Treatment Patient Details  Name: [name] MRN: [id number] Date of Birth: [birth year]  Today's Date: 07/30/2013 Time: 1100-1155 PT Time Calculation (min): 55 min Charge: Self care 1100-1112, Estim wtih ice 1114-1129, TE 1132-1155  Visit#: 6 of 8  Re-eval: 08/07/13 Assessment Diagnosis: Lumbar spondylosis Next MD Visit: Dr. Romeo Apple - 09/26/13  Authorization: medicare secondary  Authorization Time Period:    Authorization Visit#: 6 of 10   Subjective: Symptoms/Limitations Symptoms: Pt reported been to ER for allergy to current medication tylonel 3 with codine with neumous falls.   Pain Assessment Currently in Pain?: Yes Pain Score: 8  Pain Location: Back Pain Orientation: Lower;Left;Right  Objective:  Exercise/Treatments Standing Row: Both;10 reps;Theraband Theraband Level (Row): Level 3 (Green) Shoulder Extension: Both;10 reps;Theraband Theraband Level (Shoulder Extension): Level 3 (Green) Seated Other Seated Lumbar Exercises: seated posture with diaphragmatic breathing  Other Seated Lumbar Exercises: scapular retraction 10x Prone  Other Prone Lumbar Exercises: Diaphragmatic breathing in prone during estim and ice  Other Prone Lumbar Exercises: Multifidus 5x 10"  Modalities Modalities: Electrical Stimulation;Cryotherapy Cryotherapy Number Minutes Cryotherapy: 15 Minutes Cryotherapy Location: Back Type of Cryotherapy: Ice pack Pharmacologist Location: lumosacral region Statistician Action: Prone Interferential electrical stimulation (IFES) x 24 volts x15 minutes Electrical Stimulation Goals: Pain  Physical Therapy Assessment and Plan PT Assessment and Plan Clinical Impression Statement: Pt anxious and tearful with discussion about most recent ER stay, therapeutic techniques complete to improve breathing and to calm down.  Began treatment for low back pain with estim and ice for pain control with  verbal cueing for diaphragmatic breathing.  Sessiojn focus on improving sitting and standing posture..  Pt stated pain reduced at end of session.   PT Plan: F/u with session, continue with core and postureal strengthening and pain relief.  Continue with theraband postural strengthening, give pt theraband and worksheet next session.    Goals Home Exercise Program Pt/caregiver will Perform Home Exercise Program: Independently PT Short Term Goals Time to Complete Short Term Goals: 3 weeks PT Short Term Goal 1: Pt will report low back pain/gluteal pain less than 5/10 for 50% of her day.  PT Short Term Goal 1 - Progress: Progressing toward goal PT Short Term Goal 2: Pt will improve her core coordinated movements and demonstrate independent TrA and mutlfidus activition to decrease risk of further injury.  PT Short Term Goal 2 - Progress: Progressing toward goal PT Short Term Goal 3: Pt will be educated on proper posture and body mechanics.  PT Short Term Goal 3 - Progress: Progressing toward goal PT Long Term Goals PT Long Term Goal 1: Pt will improve her postural strength to [facility] in order to tolerate standing for greater than 15 minutes to complete necessary household ADL's including laundry without increased pain.  PT Long Term Goal 2: Pt will improve her BLE and core flexibility to [facility] in order to perform lumbar AROM to WNL for all directions with pain less than 3/10 in any direction. Long Term Goal 3: Pt will improve her FOTO to status greater than 64% and Limiatiaon less than 36% for improved percieved functional ability.   Problem List Patient Active Problem List   Diagnosis Date Noted  . Low back pain 07/08/2013  . Lumbar spondylosis 07/08/2013  . OA (osteoarthritis) of knee 02/11/2013  . Acute medial meniscus tear of left knee 01/16/2013  . ANXIETY 03/18/2009  . DEPRESSION 03/18/2009  . GERD 03/18/2009  . CONSTIPATION 03/18/2009  . FATTY LIVER DISEASE  03/18/2009  . BILIARY COLIC  03/18/2009  . PANCREATITIS 03/18/2009  . UTI 03/18/2009    PT - End of Session Activity Tolerance: Patient tolerated treatment well General Behavior During Therapy: [facility] for tasks assessed/performed  GP    Juel Burrow 07/30/2013, 12:04 PM

## 2013-07-30 NOTE — Telephone Encounter (Signed)
Refer patient back to her primary care doctor

## 2013-07-30 NOTE — Telephone Encounter (Signed)
Meds ordered this encounter   Medications   .  PredniSONE 10 MG KIT       Sig: Take 1 kit (10 mg total) by mouth as directed.       Dispense:  1 kit       Refill:  0       10 mg Dosepak 12 days as directed   .  HYDROcodone-acetaminophen (NORCO/VICODIN) 5-325 MG per tablet       Sig: Take 1 tablet by mouth every 8 (eight) hours as needed for pain.       Dispense:  63 tablet       Refill:  0    This is what i have in the chart what er visit is she talking about   Take ibuprofen

## 2013-07-31 NOTE — Telephone Encounter (Signed)
Patient informed of Dr. Harrison's reply. 

## 2013-08-06 ENCOUNTER — Ambulatory Visit (HOSPITAL_COMMUNITY)
Admission: RE | Admit: 2013-08-06 | Discharge: 2013-08-06 | Disposition: A | Discharge status: Home / Self Care | Payer: BC Managed Care – PPO | Source: Ambulatory Visit | Attending: Orthopedic Surgery | Admitting: Orthopedic Surgery

## 2013-08-06 DIAGNOSIS — M47816 Spondylosis without myelopathy or radiculopathy, lumbar region: Secondary | ICD-10-CM

## 2013-08-06 DIAGNOSIS — M545 Low back pain, unspecified: Secondary | ICD-10-CM

## 2013-08-06 DIAGNOSIS — M179 Osteoarthritis of knee, unspecified: Secondary | ICD-10-CM

## 2013-08-06 DIAGNOSIS — M1710 Unilateral primary osteoarthritis, unspecified knee: Secondary | ICD-10-CM

## 2013-08-06 DIAGNOSIS — IMO0001 Reserved for inherently not codable concepts without codable children: Secondary | ICD-10-CM | POA: Insufficient documentation

## 2013-08-06 NOTE — Progress Notes (Addendum)
Physical Therapy Treatment Patient Details  Name: [name] MRN: [id number] Date of Birth: May 29, 1956  Today's Date: 08/06/2013 Time: 1110-1208 PT Time Calculation (min): 58 min Charges: TE: 4098-1191 Estim/Ice: 1 Visit#: 7 of 8  Re-eval: 08/07/13    Authorization: medicare secondary  Authorization Time Period:    Authorization Visit#: 7 of 10   Subjective: Symptoms/Limitations Symptoms: Pt reports that she had a fall at home after being d/c from ER.  She is f/u with her PCP to discuss symptoms.  Still taking medication for her UTI. She feels the e-stim at end of session has helped.  Pain Assessment Currently in Pain?: Yes Pain Score: 7  Pain Location: Back Pain Orientation: Lower;Left;Right Pain Type: Acute pain  Precautions/Restrictions     Exercise/Treatments Aerobic Elliptical: NuStep: x10 minutes hills #2, resistance 3 Supine Ab Set: 10 reps;Limitations AB Set Limitations: 10" holds w/max VC using breathing for TrA contraction Clam: 10 reps;Limitations Clam Limitations: PT facilation alternating pattern Bent Knee Raise: 10 reps;Limitations Bent Knee Raise Limitations: alternation pattern, PT facilitaion Bridge: 5 reps Other Supine Lumbar Exercises: Decompression Exercises 1-5.  1# 5 minutes breathing with max cueing with TC and VC for respirator function and rib excursion #2: 5x5 sec holds, #3: 5x3 sec holds, #4: 5 reps BLE w/PT facilation, #5: 5x3 sec holds BLE    Physical Therapy Assessment and Plan PT Assessment and Plan Clinical Impression Statement: added decompression exercises today to decrease pain.  Pt requires max facilitaiotn for proper breathing technique to improve respiratory function.  Continued to encourage patient on exercises at home.  Pt will f/u with PCP tomorrow.  PT Plan: F/u with session, continue with core and postureal strengthening and pain relief.  Continue with theraband postural strengthening, give pt theraband and worksheet  next session.    Goals    Problem List Patient Active Problem List   Diagnosis Date Noted  . Low back pain 07/08/2013  . Lumbar spondylosis 07/08/2013  . OA (osteoarthritis) of knee 02/11/2013  . Acute medial meniscus tear of left knee 01/16/2013  . ANXIETY 03/18/2009  . DEPRESSION 03/18/2009  . GERD 03/18/2009  . CONSTIPATION 03/18/2009  . FATTY LIVER DISEASE 03/18/2009  . BILIARY COLIC 03/18/2009  . PANCREATITIS 03/18/2009  . UTI 03/18/2009    PT - End of Session Activity Tolerance: Patient tolerated treatment well General Behavior During Therapy: [facility] for tasks assessed/performed  GP    Sameka Bagent, MP, ATC 08/06/2013, 12:15 PM

## 2013-08-07 DIAGNOSIS — Z6831 Body mass index (BMI) 31.0-31.9, adult: Secondary | ICD-10-CM | POA: Diagnosis not present

## 2013-08-07 DIAGNOSIS — J019 Acute sinusitis, unspecified: Secondary | ICD-10-CM | POA: Diagnosis not present

## 2013-08-07 DIAGNOSIS — G8929 Other chronic pain: Secondary | ICD-10-CM | POA: Diagnosis not present

## 2013-08-07 DIAGNOSIS — H6980 Other specified disorders of Eustachian tube, unspecified ear: Secondary | ICD-10-CM | POA: Diagnosis not present

## 2013-08-13 ENCOUNTER — Ambulatory Visit (HOSPITAL_COMMUNITY)
Admission: RE | Admit: 2013-08-13 | Discharge: 2013-08-13 | Disposition: A | Discharge status: Home / Self Care | Payer: BC Managed Care – PPO | Source: Ambulatory Visit | Attending: Internal Medicine | Admitting: Internal Medicine

## 2013-08-13 DIAGNOSIS — M1710 Unilateral primary osteoarthritis, unspecified knee: Secondary | ICD-10-CM

## 2013-08-13 DIAGNOSIS — M545 Low back pain: Secondary | ICD-10-CM

## 2013-08-13 DIAGNOSIS — M47816 Spondylosis without myelopathy or radiculopathy, lumbar region: Secondary | ICD-10-CM

## 2013-08-13 NOTE — Progress Notes (Signed)
Physical Therapy Re-Evaluation  Patient Details  Name: [name] MRN: [id number] Date of Birth: December 15, 1955  Today's Date: 08/13/2013 Time: 0755-0850 PT Time Calculation (min): 55 min Charges:  TE: 755-830 MMT/ROM: 1 Estim: 835-850             Visit#: 8 of 12  Re-eval: 08/27/13 Assessment Diagnosis: Lumbar spondylosis Next MD Visit: Dr. Romeo Apple - 09/26/13  Authorization: medicare secondary    Authorization Time Period:    Authorization Visit#: 8 of 10    Subjective Symptoms/Limitations Symptoms: Pt reports that she went to see the doctor and reports he switched gave her hydrocodone for her pain (due to tylenol allergy) and put her on an antibiotic.  Feels her UTI is still effecting her.  She also has increased chest congestion and has been coughing a lot more.   Pain Assessment Currently in Pain?: Yes Pain Score: 2  Pain Location: Back  Assessment RLE Strength Right Hip Extension: 4/5 Right Hip ABduction: 4/5 Right Hip ADduction: 4/5 Right Knee Flexion: 4/5 Right Knee Extension: 4/5 LLE Strength Left Hip Flexion: 4/5 Left Hip Extension: 4/5 Left Hip ABduction: 4/5 Left Hip ADduction: 4/5 Left Knee Flexion: 4/5 Left Knee Extension: 4/5 Lumbar AROM Lumbar Flexion: WNL (was decreased 25$) Lumbar Extension: WNL (was Decreased 10%) Lumbar - Right Side Bend: WNL - most pain (was WNL - most pain) Lumbar - Left Side Bend: WNL (was decreased 10% no painf) Lumbar - Right Rotation: WNL (was decreased 50%) Lumbar - Left Rotation: WNL - no pain (was decreased 50% -pain)  Mobility/Balance  Ambulation/Gait Assistive device: Straight cane Gait Pattern: Decreased trunk rotation   Exercise/Treatments Stretches Lower Trunk Rotation: Limitations Lower Trunk Rotation Limitations: 20 reps BLE Standing Row: Both;10 reps;Theraband Theraband Level (Row): Level 3 (Green) Shoulder Extension: Both;10 reps;Theraband Theraband Level (Shoulder Extension): Level 3  (Green) Shoulder ADduction: Both;10 reps;Theraband Theraband Level (Shoulder Adduction): Level 3 (Green) Supine Clam: 15 reps (w/ab set ) Clam Limitations: PT facilation alternating pattern Bent Knee Raise: 15 reps (w/ab set) Bent Knee Raise Limitations: alternation pattern, PT facilitaion Dead Bug: 10 reps;Limitations Dead Bug Limitations: not alternating Bridge: 15 reps Straight Leg Raise: 15 reps;Limitations Straight Leg Raises Limitations: BLE Large Ball Abdominal Isometric: 5 reps;3 seconds Large Ball Oblique Isometric: 5 reps;3 seconds (Bilateral) Other Supine Lumbar Exercises: Decompression Exercises 1-5.  1# 5 minutes breathing with max cueing with TC and VC for respirator function and rib excursion #2: 5x5 sec holds, #3: 5x3 sec holds, #4: 5 reps BLE w/PT facilation, #5: 5x3 sec holds BLE Other Supine Lumbar Exercises: Shoulder flexion with diaphragmatic breathing  Modalities Modalities: Electrical Stimulation;Cryotherapy Museum/gallery exhibitions officer Stimulation Location: Lt lumosacral region Electrical Stimulation Action: Supine Interferential electrical stimulation (IFES) x 24 volts x15 minute Electrical Stimulation Goals: Pain  Physical Therapy Assessment and Plan PT Assessment and Plan Clinical Impression Statement: Ms. Cerullo has attended 8 OP PT visits since 11/3 and was sent to ER due to an allergy to tyenol with codine with following findings: She has had slow and steady gains with strength and lumbar AROM, pain on avg is a 4/10 and continues to have significant difficulty with gait.  Have educated pt on proper posture, importance of core strength and is aware of her HEP and performs when able.  For the past 2 weeks she has been suffering with UTI and recovering from her allergic reaction.  Will continue to benefit 2x/week x2 weeks.  PT Frequency: Min 2X/week PT Duration:  (2 weeks) PT Plan: Continue  with core strengtheing, S/L and prone as able if pt is feeling  better.      Goals Home Exercise Program Pt/caregiver will Perform Home Exercise Program: Independently PT Goal: Perform Home Exercise Program - Progress: Partly met PT Short Term Goals Time to Complete Short Term Goals: 3 weeks PT Short Term Goal 1: Pt will report low back pain/gluteal pain less than 5/10 for 50% of her day.  PT Short Term Goal 1 - Progress: Met PT Short Term Goal 2: Pt will improve her core coordinated movements and demonstrate independent TrA and mutlfidus activition to decrease risk of further injury.  PT Short Term Goal 2 - Progress: Met PT Short Term Goal 3: Pt will be educated on proper posture and body mechanics.  PT Short Term Goal 3 - Progress: Met PT Long Term Goals PT Long Term Goal 1: Pt will improve her postural strength to [facility] in order to tolerate standing for greater than 15 minutes to complete necessary household ADL's including laundry without increased pain.  PT Long Term Goal 1 - Progress: Progressing toward goal PT Long Term Goal 2: Pt will improve her BLE and core flexibility to [facility] in order to perform lumbar AROM to WNL for all directions with pain less than 3/10 in any direction. PT Long Term Goal 2 - Progress: Progressing toward goal Long Term Goal 3: Pt will improve her FOTO to status greater than 64% and Limiatiaon less than 36% for improved percieved functional ability.  Long Term Goal 3 Progress: Progressing toward goal  Problem List Patient Active Problem List   Diagnosis Date Noted  . Low back pain 07/08/2013  . Lumbar spondylosis 07/08/2013  . OA (osteoarthritis) of knee 02/11/2013  . Acute medial meniscus tear of left knee 01/16/2013  . ANXIETY 03/18/2009  . DEPRESSION 03/18/2009  . GERD 03/18/2009  . CONSTIPATION 03/18/2009  . FATTY LIVER DISEASE 03/18/2009  . BILIARY COLIC 03/18/2009  . PANCREATITIS 03/18/2009  . UTI 03/18/2009    PT - End of Session Activity Tolerance: Patient tolerated treatment well General Behavior  During Therapy: [facility] for tasks assessed/performed  GP    LISA MASSIE 08/13/2013, 8:44 AM  Physician Documentation Your signature is required to indicate approval of the treatment plan as stated above.  Please sign and either send electronically or make a copy of this report for your files and return this physician signed original.   Please mark one 1.__approve of plan  2. ___approve of plan with the following conditions.   ______________________________                                                          _____________________ Physician Signature  Date Signed but not read

## 2013-08-15 ENCOUNTER — Ambulatory Visit (HOSPITAL_COMMUNITY): Payer: BC Managed Care – PPO

## 2013-08-15 ENCOUNTER — Telehealth (HOSPITAL_COMMUNITY): Payer: Self-pay

## 2013-08-20 ENCOUNTER — Ambulatory Visit (HOSPITAL_COMMUNITY)
Admission: RE | Admit: 2013-08-20 | Discharge: 2013-08-20 | Disposition: A | Discharge status: Home / Self Care | Payer: BC Managed Care – PPO | Source: Ambulatory Visit | Attending: Internal Medicine | Admitting: Internal Medicine

## 2013-08-20 DIAGNOSIS — M545 Low back pain, unspecified: Secondary | ICD-10-CM

## 2013-08-20 DIAGNOSIS — M47816 Spondylosis without myelopathy or radiculopathy, lumbar region: Secondary | ICD-10-CM

## 2013-08-20 DIAGNOSIS — M1710 Unilateral primary osteoarthritis, unspecified knee: Secondary | ICD-10-CM

## 2013-08-20 DIAGNOSIS — M179 Osteoarthritis of knee, unspecified: Secondary | ICD-10-CM

## 2013-08-20 NOTE — Progress Notes (Signed)
Physical Therapy Treatment Patient Details  Name: [name] MRN: [id number] Date of Birth: 06/01/1956  Today's Date: 08/20/2013 Time: 1105-1206 PT Time Calculation (min): 61 min Charge: TE 8295-6213, Estim with ice 0865-7846  Visit#: 9 of 12  Re-eval: 08/27/13 Assessment Diagnosis: Lumbar spondylosis Next MD Visit: Dr. Romeo Apple - 09/26/13  Authorization: medicare secondary  Authorization Time Period:    Authorization Visit#: 9 of 10   Subjective: Symptoms/Limitations Symptoms: Pt stated she has new medication for infection in chest, reported LBP Lt side pain scale 8/10 today.  Pt reports st28mach sore from all the coughing she has done recently. Pain Assessment Currently in Pain?: Yes Pain Score: 8  Pain Location: Back Pain Orientation: Left;Lower  Objective:   Exercise/Treatments Standing Row: Both;15 reps;Theraband Theraband Level (Row): Level 3 (Green) Shoulder Extension: Both;15 reps;Theraband Theraband Level (Shoulder Extension): Level 3 (Green) Shoulder ADduction: Both;15 reps;Theraband Theraband Level (Shoulder Adduction): Level 3 (Green) Supine Ab Set: 10 reps;Limitations AB Set Limitations: 10" holds w/max VC using breathing for TrA contraction Other Supine Lumbar Exercises: Decompression Exercises 1-5.  1# 5 minutes diaphragmatic breathing x 5 min with max cueing with TC and VC for respirator function and rib excursion #2: 5x5 sec holds, #3: 5x3 sec holds, #4: 5 reps BLE w/PT facilation, #5: 5x3 sec holds BLE  Modalities Modalities: Electrical Stimulation;Cryotherapy Cryotherapy Number Minutes Cryotherapy: 15 Minutes Cryotherapy Location: Back Type of Cryotherapy: Ice pack Pharmacologist Location: Lt lumosacral region Statistician Action: To reduce pain Electrical Stimulation Parameters: Supine Interferential electrical stimulation (IFES) x 24 volts x15 minute Electrical Stimulation Goals: Pain  Physical  Therapy Assessment and Plan PT Assessment and Plan Clinical Impression Statement: Held new exercises due to high LBP scale this session.  Session focus on decompression exercises and diaphragmatic breathing to improve tolerance for exercises.  Continued core and postural strenthening exercises with multimodal cueing  to improve correct core musculature activation. PT Plan: Continue with core strengtheing, S/L and prone as able if pt is feeling better, Gcode due next session.    Goals Home Exercise Program Pt/caregiver will Perform Home Exercise Program: Independently PT Short Term Goals Time to Complete Short Term Goals: 3 weeks PT Short Term Goal 1: Pt will report low back pain/gluteal pain less than 5/10 for 50% of her day.  PT Short Term Goal 2: Pt will improve her core coordinated movements and demonstrate independent TrA and mutlfidus activition to decrease risk of further injury.  PT Short Term Goal 3: Pt will be educated on proper posture and body mechanics.  PT Long Term Goals PT Long Term Goal 1: Pt will improve her postural strength to [facility] in order to tolerate standing for greater than 15 minutes to complete necessary household ADL's including laundry without increased pain.  PT Long Term Goal 1 - Progress: Progressing toward goal PT Long Term Goal 2: Pt will improve her BLE and core flexibility to [facility] in order to perform lumbar AROM to WNL for all directions with pain less than 3/10 in any direction. PT Long Term Goal 2 - Progress: Progressing toward goal Long Term Goal 3: Pt will improve her FOTO to status greater than 64% and Limiatiaon less than 36% for improved percieved functional ability.   Problem List Patient Active Problem List   Diagnosis Date Noted  . Low back pain 07/08/2013  . Lumbar spondylosis 07/08/2013  . OA (osteoarthritis) of knee 02/11/2013  . Acute medial meniscus tear of left knee 01/16/2013  . ANXIETY 03/18/2009  .  DEPRESSION 03/18/2009  . GERD  03/18/2009  . CONSTIPATION 03/18/2009  . FATTY LIVER DISEASE 03/18/2009  . BILIARY COLIC 03/18/2009  . PANCREATITIS 03/18/2009  . UTI 03/18/2009    PT - End of Session Activity Tolerance: Patient tolerated treatment well General Behavior During Therapy: [facility] for tasks assessed/performed  GP    Juel Burrow 08/20/2013, 11:54 AM

## 2013-08-22 ENCOUNTER — Ambulatory Visit (HOSPITAL_COMMUNITY)
Admission: RE | Admit: 2013-08-22 | Discharge: 2013-08-22 | Disposition: A | Discharge status: Home / Self Care | Payer: BC Managed Care – PPO | Source: Ambulatory Visit | Attending: Internal Medicine | Admitting: Internal Medicine

## 2013-08-22 DIAGNOSIS — M1710 Unilateral primary osteoarthritis, unspecified knee: Secondary | ICD-10-CM

## 2013-08-22 DIAGNOSIS — M47816 Spondylosis without myelopathy or radiculopathy, lumbar region: Secondary | ICD-10-CM

## 2013-08-22 DIAGNOSIS — M545 Low back pain: Secondary | ICD-10-CM

## 2013-08-22 NOTE — Progress Notes (Signed)
Physical Therapy Treatment Patient Details  Name: [name] MRN: [id number] Date of Birth: Jun 17, 1956  Today's Date: 08/22/2013 Time: 0935-1020 PT Time Calculation (min): 45 min  Visit#: 10 of 12  Re-eval: 08/27/13 Assessment Diagnosis: Lumbar spondylosis Next MD Visit: Dr. Romeo Apple - 09/26/13 Charge: Molli Posey [id number], Estim with ice 1000-1015  Authorization: medicare secondary  Authorization Time Period:    Authorization Visit#: 10 of 10   Subjective: Symptoms/Limitations Symptoms: Pt stated LBP pain scale 7/10.  Pt interested in completed the theraband postural exercises at home.   Pain Assessment Currently in Pain?: Yes Pain Score: 7  Pain Location: Back Pain Orientation: Lower  Objective:   Exercise/Treatments Standing Row: Both;15 reps;Theraband Theraband Level (Row): Level 3 (Green) Shoulder Extension: Both;15 reps;Theraband Theraband Level (Shoulder Extension): Level 3 (Green) Shoulder ADduction: Both;15 reps;Theraband Theraband Level (Shoulder Adduction): Level 3 (Green) Supine Ab Set: 10 reps;Limitations AB Set Limitations: 10" holds w/max VC using breathing for TrA contraction Sidelying Hip Abduction: 5 reps;Limitations Hip Abduction Limitations: Bil LE Prone  Other Prone Lumbar Exercises: Heel squeeze 3x 5"  Modalities Modalities: Electrical Stimulation;Cryotherapy Cryotherapy Number Minutes Cryotherapy: 15 Minutes Cryotherapy Location: Back Type of Cryotherapy: Ice pack Pharmacologist Location: Lt lumosacral region Statistician Action: To reduce pain Electrical Stimulation Parameters: Supine Interferential electrical stimulation (IFES) x 24 volts x15 minute Electrical Stimulation Goals: Pain  Physical Therapy Assessment and Plan PT Assessment and Plan Clinical Impression Statement: Pt given theraband and worksheet to begin postural strengthening at home, min cueing required for form, pt able to  complete with good form and technique following cueing.  Began sidelying and prone exercsies for gluteal strengthening with cueing for proper form.  Ended session with prone estim and ice to lower back for pain relief.  FOTO complete with status 43%, limitation 57%. PT Plan: Continue with core strengtheing, S/L and prone as able if pt is feeling better,     Goals Home Exercise Program Pt/caregiver will Perform Home Exercise Program: Independently PT Short Term Goals Time to Complete Short Term Goals: 3 weeks PT Short Term Goal 1: Pt will report low back pain/gluteal pain less than 5/10 for 50% of her day.  PT Short Term Goal 2: Pt will improve her core coordinated movements and demonstrate independent TrA and mutlfidus activition to decrease risk of further injury.  PT Short Term Goal 3: Pt will be educated on proper posture and body mechanics.  PT Long Term Goals PT Long Term Goal 1: Pt will improve her postural strength to [facility] in order to tolerate standing for greater than 15 minutes to complete necessary household ADL's including laundry without increased pain.  PT Long Term Goal 1 - Progress: Progressing toward goal PT Long Term Goal 2: Pt will improve her BLE and core flexibility to [facility] in order to perform lumbar AROM to WNL for all directions with pain less than 3/10 in any direction. PT Long Term Goal 2 - Progress: Progressing toward goal Long Term Goal 3: Pt will improve her FOTO to status greater than 64% and Limiatiaon less than 36% for improved percieved functional ability.   Problem List Patient Active Problem List   Diagnosis Date Noted  . Low back pain 07/08/2013  . Lumbar spondylosis 07/08/2013  . OA (osteoarthritis) of knee 02/11/2013  . Acute medial meniscus tear of left knee 01/16/2013  . ANXIETY 03/18/2009  . DEPRESSION 03/18/2009  . GERD 03/18/2009  . CONSTIPATION 03/18/2009  . FATTY LIVER DISEASE 03/18/2009  . BILIARY COLIC  03/18/2009  . PANCREATITIS 03/18/2009  .  UTI 03/18/2009    PT - End of Session Activity Tolerance: Patient tolerated treatment well General Behavior During Therapy: [facility] for tasks assessed/performed  GP    Juel Burrow 08/22/2013, 12:22 PM

## 2013-09-03 ENCOUNTER — Ambulatory Visit (HOSPITAL_COMMUNITY)
Admission: RE | Admit: 2013-09-03 | Discharge: 2013-09-03 | Disposition: A | Discharge status: Home / Self Care | Payer: BC Managed Care – PPO | Source: Ambulatory Visit | Attending: Internal Medicine | Admitting: Internal Medicine

## 2013-09-03 DIAGNOSIS — M1710 Unilateral primary osteoarthritis, unspecified knee: Secondary | ICD-10-CM

## 2013-09-03 DIAGNOSIS — M545 Low back pain: Secondary | ICD-10-CM

## 2013-09-03 DIAGNOSIS — M47816 Spondylosis without myelopathy or radiculopathy, lumbar region: Secondary | ICD-10-CM

## 2013-09-03 NOTE — Progress Notes (Signed)
Physical Therapy Treatment Patient Details  Name: [name] MRN: [id number] Date of Birth: 05/30/1956  Today's Date: 09/03/2013 Time: 1103-1208 PT Time Calculation (min): 65 min Charge: TE [id number], Estim w/ ice 1150-1105  Visit#: 11 of 12  Re-eval: 08/27/13 Assessment Diagnosis: Lumbar spondylosis Next MD Visit: Dr. Romeo Apple - 09/26/13  Authorization: medicare secondary  Authorization Time Period:    Authorization Visit#: 11 of 20   Subjective: Symptoms/Limitations Symptoms: Pt stated LBP increased with weather Pain Assessment Currently in Pain?: Yes Pain Score: 8  Pain Location: Back Pain Orientation: Lower;Left  Objective:  Exercise/Treatments Aerobic Elliptical: NuStep: x10 minutes hills #3, resistance 4 for strengthening and to increase activity tolerance Standing Row: Both;15 reps;Theraband Theraband Level (Row): Level 3 (Green) Shoulder Extension: Both;15 reps;Theraband Theraband Level (Shoulder Extension): Level 3 (Green) Shoulder ADduction: Both;15 reps;Theraband Theraband Level (Shoulder Adduction): Level 3 (Green) Seated Other Seated Lumbar Exercises: seated anterior and posterior rotation 10x Supine Ab Set: 10 reps;Limitations AB Set Limitations: 10" holds w/max VC using breathing for TrA contraction Sidelying Hip Abduction: 10 reps;Limitations Hip Abduction Limitations: Bil LE Other Sidelying Lumbar Exercises: Adduction Bil LE 10x Prone  Other Prone Lumbar Exercises: Heel squeeze 5x 5"  Modalities Modalities: Electrical Stimulation;Cryotherapy Manual Therapy Other Manual Therapy: SI within alignment, no MET required Cryotherapy Number Minutes Cryotherapy: 15 Minutes Cryotherapy Location: Back Type of Cryotherapy: Ice pack Pharmacologist Location: Lt lumosacral region Statistician Action: To reduce pain Electrical Stimulation Parameters: Prone Interferential electrical stimulation (IFES)x15  minute Electrical Stimulation Goals: Pain  Physical Therapy Assessment and Plan PT Assessment and Plan Clinical Impression Statement: Continued with postural strengthening with theraband to assure correct form with min cueing required with scapular retraction and rows.  Pt sacriliac within alignment this session, no muscle energy technique required.  Ended session with estim and ice in prone position for pain control. PT Plan: Re-eval next session.    Goals Home Exercise Program Pt/caregiver will Perform Home Exercise Program: Independently PT Short Term Goals Time to Complete Short Term Goals: 3 weeks PT Short Term Goal 1: Pt will report low back pain/gluteal pain less than 5/10 for 50% of her day.  PT Short Term Goal 2: Pt will improve her core coordinated movements and demonstrate independent TrA and mutlfidus activition to decrease risk of further injury.  PT Short Term Goal 3: Pt will be educated on proper posture and body mechanics.  PT Long Term Goals PT Long Term Goal 1: Pt will improve her postural strength to [facility] in order to tolerate standing for greater than 15 minutes to complete necessary household ADL's including laundry without increased pain.  PT Long Term Goal 1 - Progress: Progressing toward goal PT Long Term Goal 2: Pt will improve her BLE and core flexibility to [facility] in order to perform lumbar AROM to WNL for all directions with pain less than 3/10 in any direction. Long Term Goal 3: Pt will improve her FOTO to status greater than 64% and Limiatiaon less than 36% for improved percieved functional ability.   Problem List Patient Active Problem List   Diagnosis Date Noted  . Low back pain 07/08/2013  . Lumbar spondylosis 07/08/2013  . OA (osteoarthritis) of knee 02/11/2013  . Acute medial meniscus tear of left knee 01/16/2013  . ANXIETY 03/18/2009  . DEPRESSION 03/18/2009  . GERD 03/18/2009  . CONSTIPATION 03/18/2009  . FATTY LIVER DISEASE 03/18/2009  . BILIARY  COLIC 03/18/2009  . PANCREATITIS 03/18/2009  . UTI 03/18/2009  PT - End of Session Activity Tolerance: Patient tolerated treatment well General Behavior During Therapy: [facility] for tasks assessed/performed  GP    Juel Burrow 09/03/2013, 12:16 PM

## 2013-09-10 ENCOUNTER — Ambulatory Visit (HOSPITAL_COMMUNITY)
Admission: RE | Admit: 2013-09-10 | Discharge: 2013-09-10 | Disposition: A | Discharge status: Home / Self Care | Payer: BC Managed Care – PPO | Source: Ambulatory Visit | Attending: Orthopedic Surgery | Admitting: Orthopedic Surgery

## 2013-09-10 DIAGNOSIS — M47816 Spondylosis without myelopathy or radiculopathy, lumbar region: Secondary | ICD-10-CM

## 2013-09-10 DIAGNOSIS — M179 Osteoarthritis of knee, unspecified: Secondary | ICD-10-CM

## 2013-09-10 DIAGNOSIS — IMO0001 Reserved for inherently not codable concepts without codable children: Secondary | ICD-10-CM | POA: Diagnosis not present

## 2013-09-10 DIAGNOSIS — M1710 Unilateral primary osteoarthritis, unspecified knee: Secondary | ICD-10-CM

## 2013-09-10 DIAGNOSIS — M545 Low back pain, unspecified: Secondary | ICD-10-CM

## 2013-09-10 NOTE — Progress Notes (Signed)
Physical Therapy Discharge Summary and Treatment  Patient Details  Name: [name] MRN: [id number] Date of Birth: 10/24/1955  Today's Date: 09/10/2013 Time: 7416-3845 PT Time Calculation (min): 52 min Charges: TE: [id number] ROM/MMT: 1 Self Care: 1040-1100 Ice: 1100-1110             Visit#: 12 of 12  Re-eval: 08/27/13 Assessment Diagnosis: Lumbar spondylosis Next MD Visit: Dr. Aline Brochure - 09/26/13  Authorization: medicare secondary    Authorization Time Period:    Authorization Visit#: 12 of 20   Subjective  Pt reports that she is sore in her Rt hip today.  She has not been able to complete her exercises due to her illness over the holiday.  States she is completing her exercises intermittently and feels comfortable with them to complete on her own when she can.  Discussed with pt about discharge and she is worried about doing her exercises at home and her continued pain.   Pain: Yes Pain Score: 8/10 Location: Right Hip  Cognition/Observation Observation/Other Assessments Observations: sacroiliac (SI) in alignment  Sensation/Coordination/Flexibility/Functional Tests  Foto: 50%/50%  RLE Strength Right Hip Extension: 5/5 (was 4/5) Right Hip ABduction: 3+/5 (was 4/5) Right Hip ADduction: 4/5 Right Knee Flexion: 5/5 Right Knee Extension: 5/5 LLE Strength Left Hip Flexion: 5/5 (was 4/5) Left Hip Extension: 4/5 (was 4/5) Left Hip ABduction: 5/5 (was 4/5) Left Hip ADduction: 4/5 Left Knee Flexion: 5/5 Left Knee Extension: 5/5 Lumbar AROM Lumbar Flexion: WNL Lumbar Extension: WNL Lumbar - Right Side Bend: WNL (was WNL - with most pain) Lumbar - Left Side Bend: WNL Lumbar - Right Rotation: WNL Lumbar - Left Rotation: WNL   Mobility/Balance  Ambulation/Gait Assistive device: Straight cane Gait Pattern: Antalgic   Exercise/Treatments Aerobic  NuStep:Hills #3, Resistance # 4 for strengthening.    Cryotherapy: 10 minutes  Location; Low Back  Physical  Therapy Assessment and Plan PT Assessment and Plan Clinical Impression Statement: Pt has attended 12 OP PT visits for low back and Rt hip pain with the following findings.  She has made only minimal gain functionally and has increased her flexibility and lumbar AROM to [facility].  At this time she was having temporary relief with manual therapy and with exercises.  Explained to patient about continuing with her HEP (as a maintance program) and to find a massage therapist for continued pain relief.  At this time pt has received all necessary education for a core strengthening program, postural education and education for continued maintance program and will be d/c from therapy. Recommend pt f/u with MD about pain management.  PT Plan: Discharge  Goals Home Exercise Program Pt/caregiver will Perform Home Exercise Program: Independently PT Goal: Perform Home Exercise Program - Progress: Met PT Short Term Goals Time to Complete Short Term Goals: 3 weeks PT Short Term Goal 1: Pt will report low back pain/gluteal pain less than 5/10 for 50% of her day.  PT Short Term Goal 1 - Progress: Met PT Short Term Goal 2: Pt will improve her core coordinated movements and demonstrate independent TrA and mutlfidus activition to decrease risk of further injury.  PT Short Term Goal 2 - Progress: Met PT Short Term Goal 3: Pt will be educated on proper posture and body mechanics.  PT Short Term Goal 3 - Progress: Met PT Long Term Goals PT Long Term Goal 1: Pt will improve her postural strength to [facility] in order to tolerate standing for greater than 15 minutes to complete necessary household ADL's including laundry  without increased pain.  (uses a shopping cart, 10-15 minutes) PT Long Term Goal 1 - Progress: Progressing toward goal PT Long Term Goal 2: Pt will improve her BLE and core flexibility to [facility] in order to perform lumbar AROM to WNL for all directions with pain less than 3/10 in any direction. (Met AROM, did not met  pain) PT Long Term Goal 2 - Progress: Partly met Long Term Goal 3: Pt will improve her FOTO to status greater than 64% and Limiatiaon less than 36% for improved percieved functional ability.  Long Term Goal 3 Progress: Not met  Problem List Patient Active Problem List   Diagnosis Date Noted  . Low back pain 07/08/2013  . Lumbar spondylosis 07/08/2013  . OA (osteoarthritis) of knee 02/11/2013  . Acute medial meniscus tear of left knee 01/16/2013  . ANXIETY 03/18/2009  . DEPRESSION 03/18/2009  . GERD 03/18/2009  . CONSTIPATION 03/18/2009  . FATTY LIVER DISEASE 03/18/2009  . BILIARY COLIC 53/66/4403  . PANCREATITIS 03/18/2009  . UTI 03/18/2009    PT - End of Session Activity Tolerance: Patient tolerated treatment well General Behavior During Therapy: WFL for tasks assessed/performed  Pt instructions: Encouraged to continue with HEP, discussed FOTO findings, f/u with MD about pain.  GP    LISA MASSIE, MPT, ATC 09/10/2013, 11:04 AM  Physician Documentation Your signature is required to indicate approval of the treatment plan as stated above.  Please sign and either send electronically or make a copy of this report for your files and return this physician signed original.   Please mark one 1.__approve of plan  2. ___approve of plan with the following conditions.   ______________________________                                                          _____________________ Physician Signature                                                                                                             Date

## 2013-09-26 ENCOUNTER — Ambulatory Visit: Payer: Medicare Other | Admitting: Orthopedic Surgery

## 2013-10-18 DIAGNOSIS — J019 Acute sinusitis, unspecified: Secondary | ICD-10-CM | POA: Diagnosis not present

## 2013-10-18 DIAGNOSIS — L509 Urticaria, unspecified: Secondary | ICD-10-CM | POA: Diagnosis not present

## 2013-10-18 DIAGNOSIS — F411 Generalized anxiety disorder: Secondary | ICD-10-CM | POA: Diagnosis not present

## 2013-10-18 DIAGNOSIS — Z6832 Body mass index (BMI) 32.0-32.9, adult: Secondary | ICD-10-CM | POA: Diagnosis not present

## 2013-11-26 DIAGNOSIS — R7309 Other abnormal glucose: Secondary | ICD-10-CM | POA: Diagnosis not present

## 2013-11-26 DIAGNOSIS — Z0000 Encounter for general adult medical examination without abnormal findings: Secondary | ICD-10-CM | POA: Diagnosis not present

## 2013-11-26 DIAGNOSIS — Z79899 Other long term (current) drug therapy: Secondary | ICD-10-CM | POA: Diagnosis not present

## 2014-01-16 DIAGNOSIS — Z6831 Body mass index (BMI) 31.0-31.9, adult: Secondary | ICD-10-CM | POA: Diagnosis not present

## 2014-01-16 DIAGNOSIS — L259 Unspecified contact dermatitis, unspecified cause: Secondary | ICD-10-CM | POA: Diagnosis not present

## 2014-03-06 ENCOUNTER — Other Ambulatory Visit (HOSPITAL_COMMUNITY): Payer: Self-pay | Admitting: Internal Medicine

## 2014-03-06 ENCOUNTER — Ambulatory Visit (HOSPITAL_COMMUNITY)
Admission: RE | Admit: 2014-03-06 | Discharge: 2014-03-06 | Disposition: A | Discharge status: Home / Self Care | Payer: Medicare Other | Source: Ambulatory Visit | Attending: Internal Medicine | Admitting: Internal Medicine

## 2014-03-06 DIAGNOSIS — S93699A Other sprain of unspecified foot, initial encounter: Secondary | ICD-10-CM | POA: Diagnosis not present

## 2014-03-06 DIAGNOSIS — S99929A Unspecified injury of unspecified foot, initial encounter: Secondary | ICD-10-CM | POA: Diagnosis not present

## 2014-03-06 DIAGNOSIS — S99919A Unspecified injury of unspecified ankle, initial encounter: Secondary | ICD-10-CM

## 2014-03-06 DIAGNOSIS — Z6832 Body mass index (BMI) 32.0-32.9, adult: Secondary | ICD-10-CM | POA: Diagnosis not present

## 2014-03-06 DIAGNOSIS — Z79899 Other long term (current) drug therapy: Secondary | ICD-10-CM | POA: Diagnosis not present

## 2014-03-06 DIAGNOSIS — S8990XA Unspecified injury of unspecified lower leg, initial encounter: Secondary | ICD-10-CM

## 2014-03-06 DIAGNOSIS — M79609 Pain in unspecified limb: Secondary | ICD-10-CM | POA: Insufficient documentation

## 2014-03-06 DIAGNOSIS — E785 Hyperlipidemia, unspecified: Secondary | ICD-10-CM | POA: Diagnosis not present

## 2014-03-06 DIAGNOSIS — E7489 Other specified disorders of carbohydrate metabolism: Secondary | ICD-10-CM | POA: Diagnosis not present

## 2014-04-02 DIAGNOSIS — G8929 Other chronic pain: Secondary | ICD-10-CM | POA: Diagnosis not present

## 2014-04-02 DIAGNOSIS — Z6832 Body mass index (BMI) 32.0-32.9, adult: Secondary | ICD-10-CM | POA: Diagnosis not present

## 2014-04-02 DIAGNOSIS — N390 Urinary tract infection, site not specified: Secondary | ICD-10-CM | POA: Diagnosis not present

## 2014-05-14 DIAGNOSIS — G8929 Other chronic pain: Secondary | ICD-10-CM | POA: Diagnosis not present

## 2014-05-14 DIAGNOSIS — Z6831 Body mass index (BMI) 31.0-31.9, adult: Secondary | ICD-10-CM | POA: Diagnosis not present

## 2014-05-14 DIAGNOSIS — J984 Other disorders of lung: Secondary | ICD-10-CM | POA: Diagnosis not present

## 2014-05-23 DIAGNOSIS — J019 Acute sinusitis, unspecified: Secondary | ICD-10-CM | POA: Diagnosis not present

## 2014-05-23 DIAGNOSIS — Z6831 Body mass index (BMI) 31.0-31.9, adult: Secondary | ICD-10-CM | POA: Diagnosis not present

## 2014-05-23 DIAGNOSIS — K120 Recurrent oral aphthae: Secondary | ICD-10-CM | POA: Diagnosis not present

## 2014-07-04 ENCOUNTER — Ambulatory Visit (HOSPITAL_COMMUNITY)
Admission: RE | Admit: 2014-07-04 | Discharge: 2014-07-04 | Disposition: A | Discharge status: Home / Self Care | Payer: Medicare Other | Source: Ambulatory Visit | Attending: Physician Assistant | Admitting: Physician Assistant

## 2014-07-04 ENCOUNTER — Other Ambulatory Visit (HOSPITAL_COMMUNITY): Payer: Self-pay | Admitting: Physician Assistant

## 2014-07-04 DIAGNOSIS — M79605 Pain in left leg: Secondary | ICD-10-CM

## 2014-07-04 DIAGNOSIS — G894 Chronic pain syndrome: Secondary | ICD-10-CM | POA: Diagnosis not present

## 2014-07-04 DIAGNOSIS — M25562 Pain in left knee: Secondary | ICD-10-CM | POA: Diagnosis not present

## 2014-07-04 DIAGNOSIS — R7309 Other abnormal glucose: Secondary | ICD-10-CM | POA: Diagnosis not present

## 2014-07-04 DIAGNOSIS — Z6831 Body mass index (BMI) 31.0-31.9, adult: Secondary | ICD-10-CM | POA: Diagnosis not present

## 2014-07-21 DIAGNOSIS — Z23 Encounter for immunization: Secondary | ICD-10-CM | POA: Diagnosis not present

## 2014-07-21 DIAGNOSIS — Z6831 Body mass index (BMI) 31.0-31.9, adult: Secondary | ICD-10-CM | POA: Diagnosis not present

## 2014-07-21 DIAGNOSIS — M1991 Primary osteoarthritis, unspecified site: Secondary | ICD-10-CM | POA: Diagnosis not present

## 2014-07-21 DIAGNOSIS — E6609 Other obesity due to excess calories: Secondary | ICD-10-CM | POA: Diagnosis not present

## 2014-07-21 DIAGNOSIS — F419 Anxiety disorder, unspecified: Secondary | ICD-10-CM | POA: Diagnosis not present

## 2014-08-27 DIAGNOSIS — J329 Chronic sinusitis, unspecified: Secondary | ICD-10-CM | POA: Diagnosis not present

## 2014-09-08 ENCOUNTER — Other Ambulatory Visit (HOSPITAL_COMMUNITY): Payer: Self-pay | Admitting: Internal Medicine

## 2014-09-08 DIAGNOSIS — Z139 Encounter for screening, unspecified: Secondary | ICD-10-CM

## 2014-09-08 DIAGNOSIS — Z1231 Encounter for screening mammogram for malignant neoplasm of breast: Secondary | ICD-10-CM

## 2014-09-11 ENCOUNTER — Ambulatory Visit (HOSPITAL_COMMUNITY)
Admission: RE | Admit: 2014-09-11 | Discharge: 2014-09-11 | Disposition: A | Discharge status: Home / Self Care | Payer: Medicare Other | Source: Ambulatory Visit | Attending: Internal Medicine | Admitting: Internal Medicine

## 2014-09-11 DIAGNOSIS — Z1231 Encounter for screening mammogram for malignant neoplasm of breast: Secondary | ICD-10-CM | POA: Diagnosis not present

## 2014-09-23 DIAGNOSIS — Z6830 Body mass index (BMI) 30.0-30.9, adult: Secondary | ICD-10-CM | POA: Diagnosis not present

## 2014-09-23 DIAGNOSIS — F419 Anxiety disorder, unspecified: Secondary | ICD-10-CM | POA: Diagnosis not present

## 2014-09-23 DIAGNOSIS — N342 Other urethritis: Secondary | ICD-10-CM | POA: Diagnosis not present

## 2014-09-23 DIAGNOSIS — J019 Acute sinusitis, unspecified: Secondary | ICD-10-CM | POA: Diagnosis not present

## 2014-09-23 DIAGNOSIS — G894 Chronic pain syndrome: Secondary | ICD-10-CM | POA: Diagnosis not present

## 2014-10-24 ENCOUNTER — Other Ambulatory Visit (HOSPITAL_COMMUNITY): Payer: Self-pay | Admitting: Internal Medicine

## 2014-10-24 DIAGNOSIS — Z79899 Other long term (current) drug therapy: Secondary | ICD-10-CM | POA: Diagnosis not present

## 2014-10-24 DIAGNOSIS — K219 Gastro-esophageal reflux disease without esophagitis: Secondary | ICD-10-CM | POA: Diagnosis not present

## 2014-10-24 DIAGNOSIS — E559 Vitamin D deficiency, unspecified: Secondary | ICD-10-CM | POA: Diagnosis not present

## 2014-10-24 DIAGNOSIS — E782 Mixed hyperlipidemia: Secondary | ICD-10-CM | POA: Diagnosis not present

## 2014-10-24 DIAGNOSIS — Z6829 Body mass index (BMI) 29.0-29.9, adult: Secondary | ICD-10-CM | POA: Diagnosis not present

## 2014-10-24 DIAGNOSIS — R1310 Dysphagia, unspecified: Secondary | ICD-10-CM

## 2014-10-24 DIAGNOSIS — R739 Hyperglycemia, unspecified: Secondary | ICD-10-CM | POA: Diagnosis not present

## 2014-10-27 ENCOUNTER — Ambulatory Visit (HOSPITAL_COMMUNITY)
Admission: RE | Admit: 2014-10-27 | Discharge: 2014-10-27 | Disposition: A | Discharge status: Home / Self Care | Payer: Medicare Other | Source: Ambulatory Visit | Attending: Internal Medicine | Admitting: Internal Medicine

## 2014-10-27 DIAGNOSIS — R1310 Dysphagia, unspecified: Secondary | ICD-10-CM | POA: Diagnosis not present

## 2014-10-27 DIAGNOSIS — K224 Dyskinesia of esophagus: Secondary | ICD-10-CM | POA: Insufficient documentation

## 2014-10-27 DIAGNOSIS — R1319 Other dysphagia: Secondary | ICD-10-CM | POA: Insufficient documentation

## 2014-11-19 DIAGNOSIS — E6609 Other obesity due to excess calories: Secondary | ICD-10-CM | POA: Diagnosis not present

## 2014-11-19 DIAGNOSIS — J209 Acute bronchitis, unspecified: Secondary | ICD-10-CM | POA: Diagnosis not present

## 2014-11-19 DIAGNOSIS — Z6830 Body mass index (BMI) 30.0-30.9, adult: Secondary | ICD-10-CM | POA: Diagnosis not present

## 2014-11-19 DIAGNOSIS — N342 Other urethritis: Secondary | ICD-10-CM | POA: Diagnosis not present

## 2014-11-21 ENCOUNTER — Encounter (HOSPITAL_COMMUNITY): Payer: Self-pay | Admitting: *Deleted

## 2014-11-21 ENCOUNTER — Emergency Department (HOSPITAL_COMMUNITY)
Admission: EM | Admit: 2014-11-21 | Discharge: 2014-11-21 | Disposition: A | Discharge status: Home / Self Care | Payer: Medicare Other | Attending: Emergency Medicine | Admitting: Emergency Medicine

## 2014-11-21 DIAGNOSIS — J45909 Unspecified asthma, uncomplicated: Secondary | ICD-10-CM | POA: Diagnosis not present

## 2014-11-21 DIAGNOSIS — E780 Pure hypercholesterolemia: Secondary | ICD-10-CM | POA: Insufficient documentation

## 2014-11-21 DIAGNOSIS — G43909 Migraine, unspecified, not intractable, without status migrainosus: Secondary | ICD-10-CM | POA: Insufficient documentation

## 2014-11-21 DIAGNOSIS — Z880 Allergy status to penicillin: Secondary | ICD-10-CM | POA: Diagnosis not present

## 2014-11-21 DIAGNOSIS — Z79899 Other long term (current) drug therapy: Secondary | ICD-10-CM | POA: Diagnosis not present

## 2014-11-21 DIAGNOSIS — Z7982 Long term (current) use of aspirin: Secondary | ICD-10-CM | POA: Diagnosis not present

## 2014-11-21 DIAGNOSIS — K219 Gastro-esophageal reflux disease without esophagitis: Secondary | ICD-10-CM | POA: Insufficient documentation

## 2014-11-21 DIAGNOSIS — R51 Headache: Secondary | ICD-10-CM | POA: Diagnosis not present

## 2014-11-21 DIAGNOSIS — R519 Headache, unspecified: Secondary | ICD-10-CM

## 2014-11-21 DIAGNOSIS — R42 Dizziness and giddiness: Secondary | ICD-10-CM | POA: Insufficient documentation

## 2014-11-21 HISTORY — DX: Migraine, unspecified, not intractable, without status migrainosus: G43.909

## 2014-11-21 MED ORDER — METOCLOPRAMIDE HCL 5 MG/ML IJ SOLN
10.0000 mg | Freq: Once | INTRAMUSCULAR | Status: AC
  Administered 2014-11-21: 10 mg via INTRAVENOUS
  Filled 2014-11-21: qty 2

## 2014-11-21 MED ORDER — SODIUM CHLORIDE 0.9 % IV BOLUS (SEPSIS)
1000.0000 mL | Freq: Once | INTRAVENOUS | Status: AC
  Administered 2014-11-21: 1000 mL via INTRAVENOUS

## 2014-11-21 MED ORDER — KETOROLAC TROMETHAMINE 30 MG/ML IJ SOLN
30.0000 mg | Freq: Once | INTRAMUSCULAR | Status: AC
  Administered 2014-11-21: 30 mg via INTRAVENOUS
  Filled 2014-11-21: qty 1

## 2014-11-21 MED ORDER — DIPHENHYDRAMINE HCL 50 MG/ML IJ SOLN
25.0000 mg | Freq: Once | INTRAMUSCULAR | Status: AC
  Administered 2014-11-21: 25 mg via INTRAVENOUS
  Filled 2014-11-21: qty 1

## 2014-11-21 MED ORDER — ONDANSETRON HCL 8 MG PO TABS: 8.0000 mg | ORAL_TABLET | ORAL | Status: DC | PRN

## 2014-11-21 MED ORDER — MECLIZINE HCL 25 MG PO TABS: 25.0000 mg | ORAL_TABLET | Freq: Four times a day (QID) | ORAL | Status: DC

## 2014-11-21 NOTE — Discharge Instructions (Signed)
Increase fluids. Medication for nausea and dizziness. Follow-up your primary care doctor.

## 2014-11-21 NOTE — ED Notes (Signed)
Treated for sinus and bladder infection Wednesday.  Was placed on  Macrobid for UTI. Last night was having visual hallucination and vertigo w/headache.  Describes sensation as if room is spinning.  Associated nausea, no vomiting.

## 2014-11-21 NOTE — ED Provider Notes (Signed)
CSN: [id number]     Arrival date & time 11/21/14  1354 History   First MD Initiated Contact with Patient 11/21/14 1522     Chief Complaint  Patient presents with  . Dizziness  . Headache     (Consider location/radiation/quality/duration/timing/severity/associated sxs/prior Treatment) HPI.... Right frontal and right. Orbital headache since last evening. Patient feels dehydrated and also a sense of the room spinning. She is recently been treated for a UTI with Macrodantin. No stiff neck or weakness in her arms or legs. No confusion. No fever, sweats, chills. Severity is mild to moderate. Certain positions makes symptoms worse  Past Medical History  Diagnosis Date  . Asthma   . Acid reflux   . High cholesterol   . Falls frequently   . Pancreatitis, acute   . Migraines    Past Surgical History  Procedure Laterality Date  . Foot surgery    . Gallbladder surgery    . Cesarean section    . Abdominal hysterectomy      Question abdominal   History reviewed. No pertinent family history. History  Substance Use Topics  . Smoking status: Never Smoker   . Smokeless tobacco: Not on file  . Alcohol Use: No   OB History    No data available     Review of Systems  All other systems reviewed and are negative.     Allergies  Acetaminophen-codeine; Diclofenac potassium; Penicillins; Sulfonamide derivatives; and Tetracycline  Home Medications   Prior to Admission medications   Medication Sig Start Date End Date Taking? Authorizing Provider  albuterol (PROVENTIL) 2 MG tablet Take 2 mg by mouth 4 (four) times daily.    Yes Historical Provider, MD  allopurinol (ZYLOPRIM) 300 MG tablet Take 1 tablet by mouth daily. 07/18/13  Yes Historical Provider, MD  ALPRAZolam Duanne Moron) 1 MG tablet Take 1 tablet by mouth 4 (four) times daily as needed for anxiety.  07/21/13  Yes Historical Provider, MD  aspirin EC 81 MG tablet Take 81 mg by mouth daily.   Yes Historical Provider, MD   butalbital-acetaminophen-caffeine (FIORICET, ESGIC) 50-325-40 MG per tablet Take 1 tablet by mouth every 6 (six) hours as needed for headache.  07/17/13  Yes Historical Provider, MD  citalopram (CELEXA) 40 MG tablet Take 1 tablet by mouth daily. 07/15/13  Yes Historical Provider, MD  cyclobenzaprine (FLEXERIL) 10 MG tablet Take 10 mg by mouth 3 (three) times daily.   Yes Historical Provider, MD  furosemide (LASIX) 20 MG tablet Take 1 tablet by mouth daily. 07/18/13  Yes Historical Provider, MD  gabapentin (NEURONTIN) 400 MG capsule Take 1 capsule by mouth 3 (three) times daily. 07/13/13  Yes Historical Provider, MD  HYDROcodone-acetaminophen (NORCO/VICODIN) 5-325 MG per tablet Take 2 tablets by mouth every 4 (four) hours as needed. Patient taking differently: Take 2 tablets by mouth every 4 (four) hours as needed for moderate pain.  07/23/13  Yes Tanna Furry, MD  Krill Oil 300 MG CAPS Take 1 capsule by mouth daily.   Yes Historical Provider, MD  omeprazole (PRILOSEC) 40 MG capsule Take 1 capsule by mouth daily. 07/18/13  Yes Historical Provider, MD  pravastatin (PRAVACHOL) 80 MG tablet Take 1 tablet by mouth daily. 07/18/13  Yes Historical Provider, MD  sertraline (ZOLOFT) 100 MG tablet Take 150 mg by mouth daily. 06/21/13  Yes Historical Provider, MD  zolpidem (AMBIEN) 10 MG tablet Take 10 mg by mouth at bedtime.   Yes Historical Provider, MD  acetaminophen-codeine (TYLENOL #3) 300-30 MG per  tablet Take 1 tablet by mouth every 4 (four) hours as needed for moderate pain. Patient not taking: Reported on 11/21/2014 07/16/13   Carole Civil, MD  meclizine (ANTIVERT) 25 MG tablet Take 1 tablet (25 mg total) by mouth 4 (four) times daily. 11/21/14   Nat Christen, MD  nitrofurantoin, macrocrystal-monohydrate, (MACROBID) 100 MG capsule Take 1 capsule (100 mg total) by mouth 2 (two) times daily. Patient not taking: Reported on 11/21/2014 07/23/13   Tanna Furry, MD  ondansetron (ZOFRAN) 8 MG tablet Take 1  tablet (8 mg total) by mouth every 4 (four) hours as needed. 11/21/14   Nat Christen, MD   BP 130/74 mmHg  Pulse 96  Temp(Src) 98.3 F (36.8 C) (Oral)  Resp 15  Ht 5\' 6"  (1.676 m)  Wt 186 lb (84.369 kg)  BMI 30.04 kg/m2  SpO2 100% Physical Exam  Constitutional: She is oriented to person, place, and time. She appears well-developed and well-nourished.  HENT:  Head: Normocephalic and atraumatic.  Eyes: Conjunctivae and EOM are normal. Pupils are equal, round, and reactive to light.  Neck: Normal range of motion. Neck supple.  Cardiovascular: Normal rate and regular rhythm.   Pulmonary/Chest: Effort normal and breath sounds normal.  Abdominal: Soft. Bowel sounds are normal.  Musculoskeletal: Normal range of motion.  Neurological: She is alert and oriented to person, place, and time.  Skin: Skin is warm and dry.  Psychiatric: She has a normal mood and affect. Her behavior is normal.  Nursing note and vitals reviewed.   ED Course  Procedures (including critical care time) Labs Review Labs Reviewed - No data to display  Imaging Review No results found.   EKG Interpretation None      MDM   Final diagnoses:  Headache, unspecified headache type  Vertigo    Patient feels much better after IV fluids, IV Benadryl, IV Reglan, IV Toradol. No gross neurological deficits. Discharge medications Zofran 8 mg and Antivert 25 mg    Nat Christen, MD 11/21/14 [phone]

## 2014-12-16 DIAGNOSIS — Z6829 Body mass index (BMI) 29.0-29.9, adult: Secondary | ICD-10-CM | POA: Diagnosis not present

## 2014-12-16 DIAGNOSIS — B029 Zoster without complications: Secondary | ICD-10-CM | POA: Diagnosis not present

## 2014-12-16 DIAGNOSIS — E063 Autoimmune thyroiditis: Secondary | ICD-10-CM | POA: Diagnosis not present

## 2014-12-16 DIAGNOSIS — E663 Overweight: Secondary | ICD-10-CM | POA: Diagnosis not present

## 2014-12-16 DIAGNOSIS — E039 Hypothyroidism, unspecified: Secondary | ICD-10-CM | POA: Diagnosis not present

## 2015-01-27 DIAGNOSIS — Z6830 Body mass index (BMI) 30.0-30.9, adult: Secondary | ICD-10-CM | POA: Diagnosis not present

## 2015-01-27 DIAGNOSIS — F419 Anxiety disorder, unspecified: Secondary | ICD-10-CM | POA: Diagnosis not present

## 2015-01-27 DIAGNOSIS — E669 Obesity, unspecified: Secondary | ICD-10-CM | POA: Diagnosis not present

## 2015-01-27 DIAGNOSIS — Z0001 Encounter for general adult medical examination with abnormal findings: Secondary | ICD-10-CM | POA: Diagnosis not present

## 2015-01-27 DIAGNOSIS — G894 Chronic pain syndrome: Secondary | ICD-10-CM | POA: Diagnosis not present

## 2015-01-27 DIAGNOSIS — R300 Dysuria: Secondary | ICD-10-CM | POA: Diagnosis not present

## 2015-01-27 DIAGNOSIS — E6609 Other obesity due to excess calories: Secondary | ICD-10-CM | POA: Diagnosis not present

## 2015-03-11 DIAGNOSIS — Z1389 Encounter for screening for other disorder: Secondary | ICD-10-CM | POA: Diagnosis not present

## 2015-03-11 DIAGNOSIS — F329 Major depressive disorder, single episode, unspecified: Secondary | ICD-10-CM | POA: Diagnosis not present

## 2015-03-11 DIAGNOSIS — L309 Dermatitis, unspecified: Secondary | ICD-10-CM | POA: Diagnosis not present

## 2015-03-11 DIAGNOSIS — M79642 Pain in left hand: Secondary | ICD-10-CM | POA: Diagnosis not present

## 2015-03-11 DIAGNOSIS — Z6832 Body mass index (BMI) 32.0-32.9, adult: Secondary | ICD-10-CM | POA: Diagnosis not present

## 2015-03-11 DIAGNOSIS — E6609 Other obesity due to excess calories: Secondary | ICD-10-CM | POA: Diagnosis not present

## 2015-04-10 DIAGNOSIS — M6530 Trigger finger, unspecified finger: Secondary | ICD-10-CM | POA: Diagnosis not present

## 2015-04-10 DIAGNOSIS — L309 Dermatitis, unspecified: Secondary | ICD-10-CM | POA: Diagnosis not present

## 2015-04-10 DIAGNOSIS — E6609 Other obesity due to excess calories: Secondary | ICD-10-CM | POA: Diagnosis not present

## 2015-04-10 DIAGNOSIS — F419 Anxiety disorder, unspecified: Secondary | ICD-10-CM | POA: Diagnosis not present

## 2015-04-10 DIAGNOSIS — Z1389 Encounter for screening for other disorder: Secondary | ICD-10-CM | POA: Diagnosis not present

## 2015-04-10 DIAGNOSIS — Z6831 Body mass index (BMI) 31.0-31.9, adult: Secondary | ICD-10-CM | POA: Diagnosis not present

## 2015-04-10 DIAGNOSIS — G894 Chronic pain syndrome: Secondary | ICD-10-CM | POA: Diagnosis not present

## 2015-04-15 DIAGNOSIS — E619 Deficiency of nutrient element, unspecified: Secondary | ICD-10-CM | POA: Diagnosis not present

## 2015-04-15 DIAGNOSIS — D72829 Elevated white blood cell count, unspecified: Secondary | ICD-10-CM | POA: Diagnosis not present

## 2015-04-15 DIAGNOSIS — L309 Dermatitis, unspecified: Secondary | ICD-10-CM | POA: Diagnosis not present

## 2015-04-16 DIAGNOSIS — N390 Urinary tract infection, site not specified: Secondary | ICD-10-CM | POA: Diagnosis not present

## 2015-05-05 DIAGNOSIS — Z1389 Encounter for screening for other disorder: Secondary | ICD-10-CM | POA: Diagnosis not present

## 2015-05-05 DIAGNOSIS — J301 Allergic rhinitis due to pollen: Secondary | ICD-10-CM | POA: Diagnosis not present

## 2015-05-05 DIAGNOSIS — E6609 Other obesity due to excess calories: Secondary | ICD-10-CM | POA: Diagnosis not present

## 2015-05-05 DIAGNOSIS — Z6831 Body mass index (BMI) 31.0-31.9, adult: Secondary | ICD-10-CM | POA: Diagnosis not present

## 2015-05-05 DIAGNOSIS — J019 Acute sinusitis, unspecified: Secondary | ICD-10-CM | POA: Diagnosis not present

## 2015-06-01 DIAGNOSIS — Z1389 Encounter for screening for other disorder: Secondary | ICD-10-CM | POA: Diagnosis not present

## 2015-06-01 DIAGNOSIS — Z79899 Other long term (current) drug therapy: Secondary | ICD-10-CM | POA: Diagnosis not present

## 2015-06-01 DIAGNOSIS — Z6831 Body mass index (BMI) 31.0-31.9, adult: Secondary | ICD-10-CM | POA: Diagnosis not present

## 2015-06-01 DIAGNOSIS — E063 Autoimmune thyroiditis: Secondary | ICD-10-CM | POA: Diagnosis not present

## 2015-06-01 DIAGNOSIS — E782 Mixed hyperlipidemia: Secondary | ICD-10-CM | POA: Diagnosis not present

## 2015-06-01 DIAGNOSIS — E6609 Other obesity due to excess calories: Secondary | ICD-10-CM | POA: Diagnosis not present

## 2015-06-01 DIAGNOSIS — G894 Chronic pain syndrome: Secondary | ICD-10-CM | POA: Diagnosis not present

## 2015-06-01 DIAGNOSIS — L739 Follicular disorder, unspecified: Secondary | ICD-10-CM | POA: Diagnosis not present

## 2015-06-01 DIAGNOSIS — R42 Dizziness and giddiness: Secondary | ICD-10-CM | POA: Diagnosis not present

## 2015-06-01 DIAGNOSIS — F419 Anxiety disorder, unspecified: Secondary | ICD-10-CM | POA: Diagnosis not present

## 2015-07-03 DIAGNOSIS — Z23 Encounter for immunization: Secondary | ICD-10-CM | POA: Diagnosis not present

## 2015-07-29 DIAGNOSIS — J301 Allergic rhinitis due to pollen: Secondary | ICD-10-CM | POA: Diagnosis not present

## 2015-07-29 DIAGNOSIS — E6609 Other obesity due to excess calories: Secondary | ICD-10-CM | POA: Diagnosis not present

## 2015-07-29 DIAGNOSIS — J019 Acute sinusitis, unspecified: Secondary | ICD-10-CM | POA: Diagnosis not present

## 2015-08-13 DIAGNOSIS — J0100 Acute maxillary sinusitis, unspecified: Secondary | ICD-10-CM | POA: Diagnosis not present

## 2015-08-13 DIAGNOSIS — G894 Chronic pain syndrome: Secondary | ICD-10-CM | POA: Diagnosis not present

## 2015-08-13 DIAGNOSIS — Z1389 Encounter for screening for other disorder: Secondary | ICD-10-CM | POA: Diagnosis not present

## 2015-08-13 DIAGNOSIS — Z6833 Body mass index (BMI) 33.0-33.9, adult: Secondary | ICD-10-CM | POA: Diagnosis not present

## 2015-08-13 DIAGNOSIS — K219 Gastro-esophageal reflux disease without esophagitis: Secondary | ICD-10-CM | POA: Diagnosis not present

## 2015-08-13 DIAGNOSIS — J209 Acute bronchitis, unspecified: Secondary | ICD-10-CM | POA: Diagnosis not present

## 2015-08-13 DIAGNOSIS — E782 Mixed hyperlipidemia: Secondary | ICD-10-CM | POA: Diagnosis not present

## 2015-10-01 ENCOUNTER — Other Ambulatory Visit (HOSPITAL_COMMUNITY): Payer: Self-pay | Admitting: Internal Medicine

## 2015-10-01 DIAGNOSIS — Z1231 Encounter for screening mammogram for malignant neoplasm of breast: Secondary | ICD-10-CM

## 2015-10-08 ENCOUNTER — Ambulatory Visit (HOSPITAL_COMMUNITY)
Admission: RE | Admit: 2015-10-08 | Discharge: 2015-10-08 | Disposition: A | Discharge status: Home / Self Care | Payer: Medicare Other | Source: Ambulatory Visit | Attending: Internal Medicine | Admitting: Internal Medicine

## 2015-10-08 ENCOUNTER — Other Ambulatory Visit (HOSPITAL_COMMUNITY): Payer: Self-pay | Admitting: Internal Medicine

## 2015-10-08 ENCOUNTER — Ambulatory Visit (HOSPITAL_COMMUNITY): Payer: Medicare Other

## 2015-10-08 DIAGNOSIS — Z1231 Encounter for screening mammogram for malignant neoplasm of breast: Secondary | ICD-10-CM | POA: Diagnosis not present

## 2015-11-17 DIAGNOSIS — E6609 Other obesity due to excess calories: Secondary | ICD-10-CM | POA: Diagnosis not present

## 2015-11-17 DIAGNOSIS — F329 Major depressive disorder, single episode, unspecified: Secondary | ICD-10-CM | POA: Diagnosis not present

## 2015-11-17 DIAGNOSIS — Z1389 Encounter for screening for other disorder: Secondary | ICD-10-CM | POA: Diagnosis not present

## 2015-11-17 DIAGNOSIS — F419 Anxiety disorder, unspecified: Secondary | ICD-10-CM | POA: Diagnosis not present

## 2015-11-17 DIAGNOSIS — K529 Noninfective gastroenteritis and colitis, unspecified: Secondary | ICD-10-CM | POA: Diagnosis not present

## 2015-11-17 DIAGNOSIS — Z6834 Body mass index (BMI) 34.0-34.9, adult: Secondary | ICD-10-CM | POA: Diagnosis not present

## 2015-11-17 DIAGNOSIS — G894 Chronic pain syndrome: Secondary | ICD-10-CM | POA: Diagnosis not present

## 2015-11-17 DIAGNOSIS — M779 Enthesopathy, unspecified: Secondary | ICD-10-CM | POA: Diagnosis not present

## 2016-01-04 DIAGNOSIS — F419 Anxiety disorder, unspecified: Secondary | ICD-10-CM | POA: Diagnosis not present

## 2016-01-04 DIAGNOSIS — Z6833 Body mass index (BMI) 33.0-33.9, adult: Secondary | ICD-10-CM | POA: Diagnosis not present

## 2016-01-04 DIAGNOSIS — R52 Pain, unspecified: Secondary | ICD-10-CM | POA: Diagnosis not present

## 2016-01-04 DIAGNOSIS — Z79891 Long term (current) use of opiate analgesic: Secondary | ICD-10-CM | POA: Diagnosis not present

## 2016-01-04 DIAGNOSIS — N390 Urinary tract infection, site not specified: Secondary | ICD-10-CM | POA: Diagnosis not present

## 2016-01-04 DIAGNOSIS — E669 Obesity, unspecified: Secondary | ICD-10-CM | POA: Diagnosis not present

## 2016-01-04 DIAGNOSIS — Z1389 Encounter for screening for other disorder: Secondary | ICD-10-CM | POA: Diagnosis not present

## 2016-01-04 DIAGNOSIS — E6609 Other obesity due to excess calories: Secondary | ICD-10-CM | POA: Diagnosis not present

## 2016-01-15 DIAGNOSIS — M79652 Pain in left thigh: Secondary | ICD-10-CM | POA: Diagnosis not present

## 2016-01-28 DIAGNOSIS — E782 Mixed hyperlipidemia: Secondary | ICD-10-CM | POA: Diagnosis not present

## 2016-01-28 DIAGNOSIS — Z1389 Encounter for screening for other disorder: Secondary | ICD-10-CM | POA: Diagnosis not present

## 2016-01-28 DIAGNOSIS — Z6832 Body mass index (BMI) 32.0-32.9, adult: Secondary | ICD-10-CM | POA: Diagnosis not present

## 2016-01-28 DIAGNOSIS — R7309 Other abnormal glucose: Secondary | ICD-10-CM | POA: Diagnosis not present

## 2016-01-28 DIAGNOSIS — Z0000 Encounter for general adult medical examination without abnormal findings: Secondary | ICD-10-CM | POA: Diagnosis not present

## 2016-01-28 DIAGNOSIS — Z79899 Other long term (current) drug therapy: Secondary | ICD-10-CM | POA: Diagnosis not present

## 2016-02-03 DIAGNOSIS — G894 Chronic pain syndrome: Secondary | ICD-10-CM | POA: Diagnosis not present

## 2016-02-03 DIAGNOSIS — Z6832 Body mass index (BMI) 32.0-32.9, adult: Secondary | ICD-10-CM | POA: Diagnosis not present

## 2016-02-03 DIAGNOSIS — F329 Major depressive disorder, single episode, unspecified: Secondary | ICD-10-CM | POA: Diagnosis not present

## 2016-02-03 DIAGNOSIS — E669 Obesity, unspecified: Secondary | ICD-10-CM | POA: Diagnosis not present

## 2016-03-15 DIAGNOSIS — G894 Chronic pain syndrome: Secondary | ICD-10-CM | POA: Diagnosis not present

## 2016-03-15 DIAGNOSIS — E6609 Other obesity due to excess calories: Secondary | ICD-10-CM | POA: Diagnosis not present

## 2016-03-15 DIAGNOSIS — Z1389 Encounter for screening for other disorder: Secondary | ICD-10-CM | POA: Diagnosis not present

## 2016-03-15 DIAGNOSIS — Z6832 Body mass index (BMI) 32.0-32.9, adult: Secondary | ICD-10-CM | POA: Diagnosis not present

## 2016-03-15 DIAGNOSIS — F419 Anxiety disorder, unspecified: Secondary | ICD-10-CM | POA: Diagnosis not present

## 2016-03-30 DIAGNOSIS — G894 Chronic pain syndrome: Secondary | ICD-10-CM | POA: Diagnosis not present

## 2016-03-30 DIAGNOSIS — R7309 Other abnormal glucose: Secondary | ICD-10-CM | POA: Diagnosis not present

## 2016-03-30 DIAGNOSIS — D649 Anemia, unspecified: Secondary | ICD-10-CM | POA: Diagnosis not present

## 2016-03-30 DIAGNOSIS — E781 Pure hyperglyceridemia: Secondary | ICD-10-CM | POA: Diagnosis not present

## 2016-03-30 DIAGNOSIS — Z6832 Body mass index (BMI) 32.0-32.9, adult: Secondary | ICD-10-CM | POA: Diagnosis not present

## 2016-03-30 DIAGNOSIS — B9561 Methicillin susceptible Staphylococcus aureus infection as the cause of diseases classified elsewhere: Secondary | ICD-10-CM | POA: Diagnosis not present

## 2016-03-30 DIAGNOSIS — M1991 Primary osteoarthritis, unspecified site: Secondary | ICD-10-CM | POA: Diagnosis not present

## 2016-03-30 DIAGNOSIS — Z1389 Encounter for screening for other disorder: Secondary | ICD-10-CM | POA: Diagnosis not present

## 2016-03-30 DIAGNOSIS — Z79899 Other long term (current) drug therapy: Secondary | ICD-10-CM | POA: Diagnosis not present

## 2016-03-30 DIAGNOSIS — R5383 Other fatigue: Secondary | ICD-10-CM | POA: Diagnosis not present

## 2016-04-05 DIAGNOSIS — Z79899 Other long term (current) drug therapy: Secondary | ICD-10-CM | POA: Diagnosis not present

## 2016-04-05 DIAGNOSIS — D649 Anemia, unspecified: Secondary | ICD-10-CM | POA: Diagnosis not present

## 2016-04-05 DIAGNOSIS — E781 Pure hyperglyceridemia: Secondary | ICD-10-CM | POA: Diagnosis not present

## 2016-04-21 DIAGNOSIS — Z23 Encounter for immunization: Secondary | ICD-10-CM | POA: Diagnosis not present

## 2016-06-07 DIAGNOSIS — G894 Chronic pain syndrome: Secondary | ICD-10-CM | POA: Diagnosis not present

## 2016-06-07 DIAGNOSIS — Z23 Encounter for immunization: Secondary | ICD-10-CM | POA: Diagnosis not present

## 2016-06-07 DIAGNOSIS — Z1389 Encounter for screening for other disorder: Secondary | ICD-10-CM | POA: Diagnosis not present

## 2016-06-07 DIAGNOSIS — E6609 Other obesity due to excess calories: Secondary | ICD-10-CM | POA: Diagnosis not present

## 2016-06-07 DIAGNOSIS — M7711 Lateral epicondylitis, right elbow: Secondary | ICD-10-CM | POA: Diagnosis not present

## 2016-06-07 DIAGNOSIS — Z6833 Body mass index (BMI) 33.0-33.9, adult: Secondary | ICD-10-CM | POA: Diagnosis not present

## 2016-06-29 DIAGNOSIS — G4709 Other insomnia: Secondary | ICD-10-CM | POA: Diagnosis not present

## 2016-06-29 DIAGNOSIS — I5022 Chronic systolic (congestive) heart failure: Secondary | ICD-10-CM | POA: Diagnosis not present

## 2016-06-29 DIAGNOSIS — M1991 Primary osteoarthritis, unspecified site: Secondary | ICD-10-CM | POA: Diagnosis not present

## 2016-06-29 DIAGNOSIS — M75101 Unspecified rotator cuff tear or rupture of right shoulder, not specified as traumatic: Secondary | ICD-10-CM | POA: Diagnosis not present

## 2016-06-29 DIAGNOSIS — Z6835 Body mass index (BMI) 35.0-35.9, adult: Secondary | ICD-10-CM | POA: Diagnosis not present

## 2016-06-29 DIAGNOSIS — E6609 Other obesity due to excess calories: Secondary | ICD-10-CM | POA: Diagnosis not present

## 2016-06-29 DIAGNOSIS — E063 Autoimmune thyroiditis: Secondary | ICD-10-CM | POA: Diagnosis not present

## 2016-07-01 DIAGNOSIS — N390 Urinary tract infection, site not specified: Secondary | ICD-10-CM | POA: Diagnosis not present

## 2016-07-19 DIAGNOSIS — Z6834 Body mass index (BMI) 34.0-34.9, adult: Secondary | ICD-10-CM | POA: Diagnosis not present

## 2016-07-19 DIAGNOSIS — G894 Chronic pain syndrome: Secondary | ICD-10-CM | POA: Diagnosis not present

## 2016-07-19 DIAGNOSIS — E782 Mixed hyperlipidemia: Secondary | ICD-10-CM | POA: Diagnosis not present

## 2016-07-19 DIAGNOSIS — J069 Acute upper respiratory infection, unspecified: Secondary | ICD-10-CM | POA: Diagnosis not present

## 2016-08-18 DIAGNOSIS — F419 Anxiety disorder, unspecified: Secondary | ICD-10-CM | POA: Diagnosis not present

## 2016-08-18 DIAGNOSIS — E6609 Other obesity due to excess calories: Secondary | ICD-10-CM | POA: Diagnosis not present

## 2016-08-18 DIAGNOSIS — Z1389 Encounter for screening for other disorder: Secondary | ICD-10-CM | POA: Diagnosis not present

## 2016-08-18 DIAGNOSIS — Z6834 Body mass index (BMI) 34.0-34.9, adult: Secondary | ICD-10-CM | POA: Diagnosis not present

## 2016-08-18 DIAGNOSIS — J029 Acute pharyngitis, unspecified: Secondary | ICD-10-CM | POA: Diagnosis not present

## 2016-08-18 DIAGNOSIS — G894 Chronic pain syndrome: Secondary | ICD-10-CM | POA: Diagnosis not present

## 2016-08-18 DIAGNOSIS — J069 Acute upper respiratory infection, unspecified: Secondary | ICD-10-CM | POA: Diagnosis not present

## 2016-08-24 ENCOUNTER — Telehealth: Payer: Self-pay | Admitting: Gastroenterology

## 2016-08-24 NOTE — Telephone Encounter (Signed)
Letter mailed to pt.  

## 2016-08-24 NOTE — Telephone Encounter (Signed)
FEB RECALL FOR TCS

## 2016-10-10 ENCOUNTER — Ambulatory Visit (HOSPITAL_COMMUNITY): Payer: Medicare Other

## 2016-10-14 ENCOUNTER — Ambulatory Visit (HOSPITAL_COMMUNITY)
Admission: RE | Admit: 2016-10-14 | Discharge: 2016-10-14 | Disposition: A | Discharge status: Home / Self Care | Payer: Medicare Other | Source: Ambulatory Visit | Attending: Internal Medicine | Admitting: Internal Medicine

## 2016-10-14 DIAGNOSIS — Z1231 Encounter for screening mammogram for malignant neoplasm of breast: Secondary | ICD-10-CM

## 2016-11-07 DIAGNOSIS — I5032 Chronic diastolic (congestive) heart failure: Secondary | ICD-10-CM | POA: Diagnosis not present

## 2016-11-07 DIAGNOSIS — J329 Chronic sinusitis, unspecified: Secondary | ICD-10-CM | POA: Diagnosis not present

## 2016-11-07 DIAGNOSIS — K219 Gastro-esophageal reflux disease without esophagitis: Secondary | ICD-10-CM | POA: Diagnosis not present

## 2016-11-07 DIAGNOSIS — Z6837 Body mass index (BMI) 37.0-37.9, adult: Secondary | ICD-10-CM | POA: Diagnosis not present

## 2016-11-11 ENCOUNTER — Ambulatory Visit (HOSPITAL_COMMUNITY)
Admission: RE | Admit: 2016-11-11 | Discharge: 2016-11-11 | Disposition: A | Discharge status: Home / Self Care | Payer: Medicare Other | Source: Ambulatory Visit | Attending: Internal Medicine | Admitting: Internal Medicine

## 2016-11-11 ENCOUNTER — Other Ambulatory Visit (HOSPITAL_COMMUNITY): Payer: Self-pay | Admitting: Internal Medicine

## 2016-11-11 DIAGNOSIS — R0602 Shortness of breath: Secondary | ICD-10-CM | POA: Diagnosis not present

## 2016-11-11 DIAGNOSIS — R0600 Dyspnea, unspecified: Secondary | ICD-10-CM | POA: Insufficient documentation

## 2016-11-11 DIAGNOSIS — K219 Gastro-esophageal reflux disease without esophagitis: Secondary | ICD-10-CM | POA: Diagnosis not present

## 2016-11-11 DIAGNOSIS — R918 Other nonspecific abnormal finding of lung field: Secondary | ICD-10-CM | POA: Insufficient documentation

## 2016-11-11 DIAGNOSIS — Z6837 Body mass index (BMI) 37.0-37.9, adult: Secondary | ICD-10-CM | POA: Diagnosis not present

## 2016-11-11 DIAGNOSIS — R600 Localized edema: Secondary | ICD-10-CM | POA: Diagnosis not present

## 2016-11-11 DIAGNOSIS — R05 Cough: Secondary | ICD-10-CM | POA: Diagnosis not present

## 2016-11-11 DIAGNOSIS — E063 Autoimmune thyroiditis: Secondary | ICD-10-CM | POA: Diagnosis not present

## 2016-11-11 DIAGNOSIS — G894 Chronic pain syndrome: Secondary | ICD-10-CM | POA: Diagnosis not present

## 2017-01-13 ENCOUNTER — Encounter: Payer: Self-pay | Admitting: Orthopedic Surgery

## 2017-01-13 ENCOUNTER — Ambulatory Visit (INDEPENDENT_AMBULATORY_CARE_PROVIDER_SITE_OTHER): Payer: Medicare Other

## 2017-01-13 ENCOUNTER — Ambulatory Visit (INDEPENDENT_AMBULATORY_CARE_PROVIDER_SITE_OTHER): Payer: Medicare Other | Admitting: Orthopedic Surgery

## 2017-01-13 VITALS — BP 117/76 | HR 102 | Ht 65.0 in | Wt 200.0 lb

## 2017-01-13 DIAGNOSIS — M25561 Pain in right knee: Secondary | ICD-10-CM

## 2017-01-13 DIAGNOSIS — M1711 Unilateral primary osteoarthritis, right knee: Secondary | ICD-10-CM

## 2017-01-13 DIAGNOSIS — M7051 Other bursitis of knee, right knee: Secondary | ICD-10-CM | POA: Diagnosis not present

## 2017-01-13 NOTE — Patient Instructions (Signed)
YOU HAVE BURSITIS AND ARTHRITIS OF THE KNEE  APPLY ICE TO THE KNEE 3 TIMES A DAY   IT IS OK TO USE A TOPICAL MUSCLE CREAM SUCH AS BEN GAY OR ASPERCREME 3 X A DY AS WELL   You have received an injection of steroids into the joint. 15% of patients will have increased pain within the 24 hours postinjection.   This is transient and will go away.   We recommend that you use ice packs on the injection site for 20 minutes every 2 hours and extra strength Tylenol 2 tablets every 8 as needed until the pain resolves.  If you continue to have pain after taking the Tylenol and using the ice please call the office for further instructions.

## 2017-01-13 NOTE — Progress Notes (Signed)
NEW PATIENT OFFICE VISIT    Chief Complaint  Patient presents with  . Knee Pain    Right knee pain, no injury.    61 year old female presents with new onset pain for the last 2-3 weeks. The pain is in the medial and posterior aspects of the right knee associated with swelling of the past bursa and knee joint. The patient complains of severe dull constant aching knee pain with no history of trauma    Review of Systems  Constitutional: Negative for fever.  Skin: Negative for rash.  Neurological: Negative for tremors.     Past Medical History:  Diagnosis Date  . Acid reflux   . Asthma   . Falls frequently   . High cholesterol   . Migraines   . Pancreatitis, acute     Past Surgical History:  Procedure Laterality Date  . ABDOMINAL HYSTERECTOMY     Question abdominal  . CESAREAN SECTION    . FOOT SURGERY    . GALLBLADDER SURGERY      History reviewed. No pertinent family history. Social History  Substance Use Topics  . Smoking status: Never Smoker  . Smokeless tobacco: Never Used  . Alcohol use No    BP 117/76   Pulse (!) 102   Ht 5\' 5"  (1.651 m)   Wt 200 lb (90.7 kg)   BMI 33.28 kg/m   Physical Exam  Constitutional: She is oriented to person, place, and time. She appears well-developed and well-nourished.  Neurological: She is alert and oriented to person, place, and time.  Psychiatric: She has a normal mood and affect.  Vitals reviewed.   Ortho Exam Gait is remarkable for a cane and limping on the right side  The right knee exam shows tenderness and swelling of the peds bursa and tendons with tenderness of the medial joint line nontender lateral joint line. No joint effusion.  She can flex the knee up to 115 passively and 5 lack of full extension. The knee is stable to stress testing in the anteroposterior as well as mediolateral plan she has normal motor function normal pulse and sensation skin without rash  Left knee full range of motion stability  and strength No orders of the defined types were placed in this encounter.   Encounter Diagnoses  Name Primary?  . Right knee pain, unspecified chronicity Yes  . Pes anserinus bursitis of right knee   . Primary osteoarthritis of right knee      PLAN:   Inject both areas  Ice and topical medications as needed  Follow-up if no improvement  Procedure note right knee injection verbal consent was obtained to inject right knee joint  Timeout was completed to confirm the site of injection  The medications used were 40 mg of Depo-Medrol and 1% lidocaine 3 cc  Anesthesia was provided by ethyl chloride and the skin was prepped with alcohol.  After cleaning the skin with alcohol a 20-gauge needle was used to inject the right knee joint. There were no complications. A sterile bandage was applied.  Procedure note right knee injection for bursitis  verbal consent was obtained to inject right knee PES BURSA  Timeout was completed to confirm the site of injection  The medications used were 40 mg of Depo-Medrol and 1% lidocaine 3 cc  Anesthesia was provided by ethyl chloride and the skin was prepped with alcohol.  After cleaning the skin with alcohol a 25-gauge needle was used to inject the right knee bursa.  There  were no complications and a sterile bandage was applied

## 2017-01-20 DIAGNOSIS — Z1211 Encounter for screening for malignant neoplasm of colon: Secondary | ICD-10-CM | POA: Diagnosis not present

## 2017-02-04 DIAGNOSIS — Z1211 Encounter for screening for malignant neoplasm of colon: Secondary | ICD-10-CM | POA: Diagnosis not present

## 2017-02-16 DIAGNOSIS — J069 Acute upper respiratory infection, unspecified: Secondary | ICD-10-CM | POA: Diagnosis not present

## 2017-02-16 DIAGNOSIS — E6609 Other obesity due to excess calories: Secondary | ICD-10-CM | POA: Diagnosis not present

## 2017-02-16 DIAGNOSIS — Z6834 Body mass index (BMI) 34.0-34.9, adult: Secondary | ICD-10-CM | POA: Diagnosis not present

## 2017-03-28 ENCOUNTER — Other Ambulatory Visit (HOSPITAL_COMMUNITY): Payer: Self-pay | Admitting: Family Medicine

## 2017-03-28 ENCOUNTER — Ambulatory Visit (HOSPITAL_COMMUNITY)
Admission: RE | Admit: 2017-03-28 | Discharge: 2017-03-28 | Disposition: A | Discharge status: Home / Self Care | Payer: Medicare Other | Source: Ambulatory Visit | Attending: Family Medicine | Admitting: Family Medicine

## 2017-03-28 DIAGNOSIS — R21 Rash and other nonspecific skin eruption: Secondary | ICD-10-CM | POA: Diagnosis not present

## 2017-03-28 DIAGNOSIS — R58 Hemorrhage, not elsewhere classified: Secondary | ICD-10-CM | POA: Diagnosis not present

## 2017-03-28 DIAGNOSIS — M25532 Pain in left wrist: Secondary | ICD-10-CM | POA: Insufficient documentation

## 2017-03-28 DIAGNOSIS — Z79891 Long term (current) use of opiate analgesic: Secondary | ICD-10-CM | POA: Diagnosis not present

## 2017-03-28 DIAGNOSIS — Z6834 Body mass index (BMI) 34.0-34.9, adult: Secondary | ICD-10-CM | POA: Diagnosis not present

## 2017-03-28 DIAGNOSIS — S60212A Contusion of left wrist, initial encounter: Secondary | ICD-10-CM | POA: Diagnosis not present

## 2017-04-28 DIAGNOSIS — R3915 Urgency of urination: Secondary | ICD-10-CM | POA: Diagnosis not present

## 2017-04-28 DIAGNOSIS — F419 Anxiety disorder, unspecified: Secondary | ICD-10-CM | POA: Diagnosis not present

## 2017-04-28 DIAGNOSIS — Z1389 Encounter for screening for other disorder: Secondary | ICD-10-CM | POA: Diagnosis not present

## 2017-04-28 DIAGNOSIS — R946 Abnormal results of thyroid function studies: Secondary | ICD-10-CM | POA: Diagnosis not present

## 2017-04-28 DIAGNOSIS — M1991 Primary osteoarthritis, unspecified site: Secondary | ICD-10-CM | POA: Diagnosis not present

## 2017-04-28 DIAGNOSIS — Z0000 Encounter for general adult medical examination without abnormal findings: Secondary | ICD-10-CM | POA: Diagnosis not present

## 2017-04-28 DIAGNOSIS — G894 Chronic pain syndrome: Secondary | ICD-10-CM | POA: Diagnosis not present

## 2017-04-28 DIAGNOSIS — Z6833 Body mass index (BMI) 33.0-33.9, adult: Secondary | ICD-10-CM | POA: Diagnosis not present

## 2017-04-28 DIAGNOSIS — D519 Vitamin B12 deficiency anemia, unspecified: Secondary | ICD-10-CM | POA: Diagnosis not present

## 2017-04-28 DIAGNOSIS — E785 Hyperlipidemia, unspecified: Secondary | ICD-10-CM | POA: Diagnosis not present

## 2017-04-28 DIAGNOSIS — E559 Vitamin D deficiency, unspecified: Secondary | ICD-10-CM | POA: Diagnosis not present

## 2017-05-19 DIAGNOSIS — L309 Dermatitis, unspecified: Secondary | ICD-10-CM | POA: Diagnosis not present

## 2017-05-19 DIAGNOSIS — Z1389 Encounter for screening for other disorder: Secondary | ICD-10-CM | POA: Diagnosis not present

## 2017-05-19 DIAGNOSIS — E6609 Other obesity due to excess calories: Secondary | ICD-10-CM | POA: Diagnosis not present

## 2017-05-19 DIAGNOSIS — Z6834 Body mass index (BMI) 34.0-34.9, adult: Secondary | ICD-10-CM | POA: Diagnosis not present

## 2017-05-19 DIAGNOSIS — Z23 Encounter for immunization: Secondary | ICD-10-CM | POA: Diagnosis not present

## 2017-05-19 DIAGNOSIS — B880 Other acariasis: Secondary | ICD-10-CM | POA: Diagnosis not present

## 2017-07-12 DIAGNOSIS — Z6835 Body mass index (BMI) 35.0-35.9, adult: Secondary | ICD-10-CM | POA: Diagnosis not present

## 2017-07-12 DIAGNOSIS — J019 Acute sinusitis, unspecified: Secondary | ICD-10-CM | POA: Diagnosis not present

## 2017-07-12 DIAGNOSIS — E6609 Other obesity due to excess calories: Secondary | ICD-10-CM | POA: Diagnosis not present

## 2017-07-12 DIAGNOSIS — G894 Chronic pain syndrome: Secondary | ICD-10-CM | POA: Diagnosis not present

## 2017-07-12 DIAGNOSIS — N342 Other urethritis: Secondary | ICD-10-CM | POA: Diagnosis not present

## 2017-07-12 DIAGNOSIS — Z1389 Encounter for screening for other disorder: Secondary | ICD-10-CM | POA: Diagnosis not present

## 2017-10-12 DIAGNOSIS — E782 Mixed hyperlipidemia: Secondary | ICD-10-CM | POA: Diagnosis not present

## 2017-10-12 DIAGNOSIS — Z6833 Body mass index (BMI) 33.0-33.9, adult: Secondary | ICD-10-CM | POA: Diagnosis not present

## 2017-10-12 DIAGNOSIS — E6609 Other obesity due to excess calories: Secondary | ICD-10-CM | POA: Diagnosis not present

## 2017-10-12 DIAGNOSIS — M150 Primary generalized (osteo)arthritis: Secondary | ICD-10-CM | POA: Diagnosis not present

## 2017-10-12 DIAGNOSIS — E063 Autoimmune thyroiditis: Secondary | ICD-10-CM | POA: Diagnosis not present

## 2017-10-12 DIAGNOSIS — F419 Anxiety disorder, unspecified: Secondary | ICD-10-CM | POA: Diagnosis not present

## 2017-10-12 DIAGNOSIS — J069 Acute upper respiratory infection, unspecified: Secondary | ICD-10-CM | POA: Diagnosis not present

## 2017-10-12 DIAGNOSIS — Z1389 Encounter for screening for other disorder: Secondary | ICD-10-CM | POA: Diagnosis not present

## 2017-10-12 DIAGNOSIS — R7309 Other abnormal glucose: Secondary | ICD-10-CM | POA: Diagnosis not present

## 2017-11-01 ENCOUNTER — Ambulatory Visit (INDEPENDENT_AMBULATORY_CARE_PROVIDER_SITE_OTHER): Payer: Medicare Other | Admitting: Orthopedic Surgery

## 2017-11-01 ENCOUNTER — Encounter: Payer: Self-pay | Admitting: Orthopedic Surgery

## 2017-11-01 ENCOUNTER — Ambulatory Visit (INDEPENDENT_AMBULATORY_CARE_PROVIDER_SITE_OTHER): Payer: Medicare Other

## 2017-11-01 VITALS — BP 117/78 | HR 125 | Ht 65.0 in | Wt 196.0 lb

## 2017-11-01 DIAGNOSIS — M25562 Pain in left knee: Secondary | ICD-10-CM | POA: Diagnosis not present

## 2017-11-01 NOTE — Progress Notes (Signed)
Progress Note   Patient ID: [name], female   DOB: 09-07-1955, 62 y.o.   MRN: [id number]  Chief Complaint  Patient presents with  . Knee Pain    left    62 year old female presents for evaluation of her.  She reports dull aching pain superolateral aspect left knee for over 3 weeks associated with swelling decreased range of no trauma     Review of Systems  Constitutional: Negative.  Negative for fever.  Skin: Negative for itching and rash.  Neurological: Negative for tingling.   Current Meds  Medication Sig  . albuterol (PROVENTIL) 2 MG tablet Take 2 mg by mouth 4 (four) times daily.   Marland Kitchen ALPRAZolam (XANAX) 1 MG tablet Take 1 tablet by mouth 4 (four) times daily as needed for anxiety.   Marland Kitchen aspirin EC 81 MG tablet Take 81 mg by mouth daily.  . butalbital-acetaminophen-caffeine (FIORICET, ESGIC) 50-325-40 MG per tablet Take 1 tablet by mouth every 6 (six) hours as needed for headache.   . citalopram (CELEXA) 40 MG tablet Take 1 tablet by mouth daily.  . cyclobenzaprine (FLEXERIL) 10 MG tablet Take 10 mg by mouth 3 (three) times daily.  . furosemide (LASIX) 20 MG tablet Take 1 tablet by mouth daily.  Marland Kitchen gabapentin (NEURONTIN) 400 MG capsule Take 1 capsule by mouth 3 (three) times daily.  Marland Kitchen HYDROcodone-acetaminophen (NORCO/VICODIN) 5-325 MG per tablet Take 2 tablets by mouth every 4 (four) hours as needed. (Patient taking differently: Take 2 tablets by mouth every 4 (four) hours as needed for moderate pain. )  . Krill Oil 300 MG CAPS Take 1 capsule by mouth daily.  . meclizine (ANTIVERT) 25 MG tablet Take 1 tablet (25 mg total) by mouth 4 (four) times daily.  Marland Kitchen omeprazole (PRILOSEC) 40 MG capsule Take 1 capsule by mouth daily.  . ondansetron (ZOFRAN) 8 MG tablet Take 1 tablet (8 mg total) by mouth every 4 (four) hours as needed.  . pravastatin (PRAVACHOL) 80 MG tablet Take 1 tablet by mouth daily.  . sertraline (ZOLOFT) 100 MG tablet Take 150 mg by mouth daily.  Marland Kitchen zolpidem  (AMBIEN) 10 MG tablet Take 10 mg by mouth at bedtime.    Past Medical History:  Diagnosis Date  . Acid reflux   . Asthma   . Falls frequently   . High cholesterol   . Migraines   . Pancreatitis, acute      Allergies  Allergen Reactions  . Acetaminophen-Codeine Other (See Comments)    Made patient talk out of her head and have slurred speech.  . Diclofenac Potassium Other (See Comments)    cramps  . Penicillins     REACTION: unknown reaction  . Sulfonamide Derivatives     REACTION: unknown reacton  . Tetracycline     REACTION: unknown reaction    BP 117/78   Pulse (!) 125   Ht 5\' 5"  (1.651 m)   Wt 196 lb (88.9 kg)   BMI 32.62 kg/m    Physical Exam  Constitutional: She is oriented to person, place, and time. She appears well-developed and well-nourished.  Musculoskeletal:       Left knee: She exhibits effusion.  Neurological: She is alert and oriented to person, place, and time.  Psychiatric: She has a normal mood and affect. Judgment normal.  Vitals reviewed.   Right Knee Exam   Muscle Strength  The patient has normal right knee strength.  Tenderness  The patient is experiencing no tenderness.   Range  of Motion  Extension: normal  Flexion: normal   Tests  McMurray:  Medial - negative Lateral - negative Varus: negative Valgus: negative Drawer:  Anterior - negative    Posterior - negative  Other  Erythema: absent Scars: absent Sensation: normal Pulse: present Swelling: none   Left Knee Exam   Muscle Strength  The patient has normal left knee strength.  Tenderness  Left knee tenderness location: Pain suprapatellar pouch mild effusion.  Range of Motion  Extension: normal  Flexion:  120 normal   Tests  McMurray:  Medial - negative Lateral - negative Varus: negative Valgus: negative Drawer:  Anterior - negative     Posterior - negative  Other  Erythema: absent Scars: absent Sensation: normal Pulse: present Swelling: none Effusion:  effusion present       Medical decision-making  Imaging: See dictated report.  The x-ray shows mild arthritis without deformity Encounter Diagnosis  Name Primary?  . Acute pain of left knee Yes    Procedure note left knee injection verbal consent was obtained to inject left knee joint  Timeout was completed to confirm the site of injection  The medications used were 40 mg of Depo-Medrol and 1% lidocaine 3 cc  Anesthesia was provided by ethyl chloride and the skin was prepped with alcohol.  After cleaning the skin with alcohol a 20-gauge needle was used to inject the left knee joint. There were no complications. A sterile bandage was applied.  [place], MD 11/01/2017 4:44 PM

## 2017-11-17 ENCOUNTER — Other Ambulatory Visit (HOSPITAL_COMMUNITY): Payer: Self-pay | Admitting: Family Medicine

## 2017-11-17 DIAGNOSIS — Z1231 Encounter for screening mammogram for malignant neoplasm of breast: Secondary | ICD-10-CM

## 2017-11-20 ENCOUNTER — Ambulatory Visit (HOSPITAL_COMMUNITY)
Admission: RE | Admit: 2017-11-20 | Discharge: 2017-11-20 | Disposition: A | Discharge status: Home / Self Care | Payer: Medicare Other | Source: Ambulatory Visit | Attending: Family Medicine | Admitting: Family Medicine

## 2017-11-20 DIAGNOSIS — Z1231 Encounter for screening mammogram for malignant neoplasm of breast: Secondary | ICD-10-CM | POA: Diagnosis not present

## 2017-11-21 DIAGNOSIS — G894 Chronic pain syndrome: Secondary | ICD-10-CM | POA: Diagnosis not present

## 2017-11-21 DIAGNOSIS — Z6832 Body mass index (BMI) 32.0-32.9, adult: Secondary | ICD-10-CM | POA: Diagnosis not present

## 2017-11-21 DIAGNOSIS — G43909 Migraine, unspecified, not intractable, without status migrainosus: Secondary | ICD-10-CM | POA: Diagnosis not present

## 2017-11-21 DIAGNOSIS — E6609 Other obesity due to excess calories: Secondary | ICD-10-CM | POA: Diagnosis not present

## 2017-12-13 ENCOUNTER — Encounter: Payer: Self-pay | Admitting: Orthopedic Surgery

## 2017-12-13 ENCOUNTER — Ambulatory Visit: Payer: Medicare Other | Admitting: Orthopedic Surgery

## 2017-12-21 DIAGNOSIS — F419 Anxiety disorder, unspecified: Secondary | ICD-10-CM | POA: Diagnosis not present

## 2017-12-21 DIAGNOSIS — Z6831 Body mass index (BMI) 31.0-31.9, adult: Secondary | ICD-10-CM | POA: Diagnosis not present

## 2017-12-21 DIAGNOSIS — Z1389 Encounter for screening for other disorder: Secondary | ICD-10-CM | POA: Diagnosis not present

## 2017-12-21 DIAGNOSIS — E6609 Other obesity due to excess calories: Secondary | ICD-10-CM | POA: Diagnosis not present

## 2017-12-21 DIAGNOSIS — G894 Chronic pain syndrome: Secondary | ICD-10-CM | POA: Diagnosis not present

## 2018-01-01 ENCOUNTER — Ambulatory Visit (INDEPENDENT_AMBULATORY_CARE_PROVIDER_SITE_OTHER): Payer: Medicare Other | Admitting: Orthopedic Surgery

## 2018-01-01 VITALS — BP 136/87 | HR 100 | Ht 65.0 in | Wt 187.0 lb

## 2018-01-01 DIAGNOSIS — M1712 Unilateral primary osteoarthritis, left knee: Secondary | ICD-10-CM | POA: Diagnosis not present

## 2018-01-01 DIAGNOSIS — G8929 Other chronic pain: Secondary | ICD-10-CM

## 2018-01-01 DIAGNOSIS — M25562 Pain in left knee: Secondary | ICD-10-CM

## 2018-01-01 NOTE — Progress Notes (Signed)
Progress Note   Patient ID: [name], female   DOB: 1955-09-16, 62 y.o.   MRN: [id number]  Chief Complaint  Patient presents with  . Follow-up    Recheck on left knee.     Medical decision-making Encounter Diagnoses  Name Primary?  . Chronic pain of left knee Yes  . Primary osteoarthritis of left knee    No orders of the defined types were placed in this encounter.  PLAN: Procedure note left knee injection verbal consent was obtained to inject left knee joint  Timeout was completed to confirm the site of injection  The medications used were 40 mg of Depo-Medrol and 1% lidocaine 3 cc  Anesthesia was provided by ethyl chloride and the skin was prepped with alcohol.  After cleaning the skin with alcohol a 20-gauge needle was used to inject the left knee joint. There were no complications. A sterile bandage was applied.   Chief Complaint  Patient presents with  . Follow-up    Recheck on left knee.    62 year old female with chronic left knee pain left knee arthritis worsening pain x-rays from February 2019 showed mild arthritis symmetric joint space narrowing.,  Pains worse in the last week or so with worse pain with rain and cold weather    Review of Systems  Skin: Negative.    No outpatient medications have been marked as taking for the 01/01/18 encounter (Office Visit) with Carole Civil, MD.    Allergies  Allergen Reactions  . Acetaminophen-Codeine Other (See Comments)    Made patient talk out of her head and have slurred speech.  . Diclofenac Potassium Other (See Comments)    cramps  . Penicillins     REACTION: unknown reaction  . Sulfonamide Derivatives     REACTION: unknown reacton  . Tetracycline     REACTION: unknown reaction     BP 136/87   Pulse 100   Ht 5\' 5"  (1.651 m)   Wt 187 lb (84.8 kg)   BMI 31.12 kg/m   Physical Exam  Constitutional: She is oriented to person, place, and time. She appears well-developed and well-nourished.   Musculoskeletal:       Left knee: She exhibits no swelling, no effusion, no LCL laxity and no MCL laxity. Tenderness found. Medial joint line tenderness noted.  Neurological: She is alert and oriented to person, place, and time.  Psychiatric: She has a normal mood and affect. Judgment normal.  Vitals reviewed.      Arther Abbott, MD 01/01/2018 2:20 PM

## 2018-02-16 DIAGNOSIS — Z6831 Body mass index (BMI) 31.0-31.9, adult: Secondary | ICD-10-CM | POA: Diagnosis not present

## 2018-02-16 DIAGNOSIS — E782 Mixed hyperlipidemia: Secondary | ICD-10-CM | POA: Diagnosis not present

## 2018-02-16 DIAGNOSIS — G894 Chronic pain syndrome: Secondary | ICD-10-CM | POA: Diagnosis not present

## 2018-02-16 DIAGNOSIS — R7309 Other abnormal glucose: Secondary | ICD-10-CM | POA: Diagnosis not present

## 2018-02-16 DIAGNOSIS — Z1389 Encounter for screening for other disorder: Secondary | ICD-10-CM | POA: Diagnosis not present

## 2018-02-16 DIAGNOSIS — E063 Autoimmune thyroiditis: Secondary | ICD-10-CM | POA: Diagnosis not present

## 2018-02-16 DIAGNOSIS — Z0001 Encounter for general adult medical examination with abnormal findings: Secondary | ICD-10-CM | POA: Diagnosis not present

## 2018-03-21 DIAGNOSIS — N342 Other urethritis: Secondary | ICD-10-CM | POA: Diagnosis not present

## 2018-03-21 DIAGNOSIS — E119 Type 2 diabetes mellitus without complications: Secondary | ICD-10-CM | POA: Diagnosis not present

## 2018-03-21 DIAGNOSIS — Z6831 Body mass index (BMI) 31.0-31.9, adult: Secondary | ICD-10-CM | POA: Diagnosis not present

## 2018-03-21 DIAGNOSIS — G894 Chronic pain syndrome: Secondary | ICD-10-CM | POA: Diagnosis not present

## 2018-04-03 ENCOUNTER — Encounter (HOSPITAL_COMMUNITY): Payer: Self-pay | Admitting: Emergency Medicine

## 2018-04-03 ENCOUNTER — Other Ambulatory Visit: Payer: Self-pay

## 2018-04-03 ENCOUNTER — Observation Stay (HOSPITAL_COMMUNITY)
Admission: EM | Admit: 2018-04-03 | Discharge: 2018-04-04 | Disposition: A | Discharge status: Home / Self Care | Payer: Medicare Other | Attending: Internal Medicine | Admitting: Internal Medicine

## 2018-04-03 ENCOUNTER — Emergency Department (HOSPITAL_COMMUNITY): Payer: Medicare Other

## 2018-04-03 DIAGNOSIS — R531 Weakness: Secondary | ICD-10-CM | POA: Insufficient documentation

## 2018-04-03 DIAGNOSIS — Z79899 Other long term (current) drug therapy: Secondary | ICD-10-CM | POA: Diagnosis not present

## 2018-04-03 DIAGNOSIS — E039 Hypothyroidism, unspecified: Secondary | ICD-10-CM | POA: Diagnosis not present

## 2018-04-03 DIAGNOSIS — R519 Headache, unspecified: Secondary | ICD-10-CM

## 2018-04-03 DIAGNOSIS — R51 Headache: Secondary | ICD-10-CM | POA: Diagnosis not present

## 2018-04-03 DIAGNOSIS — N390 Urinary tract infection, site not specified: Secondary | ICD-10-CM | POA: Insufficient documentation

## 2018-04-03 DIAGNOSIS — F419 Anxiety disorder, unspecified: Principal | ICD-10-CM | POA: Insufficient documentation

## 2018-04-03 DIAGNOSIS — R410 Disorientation, unspecified: Secondary | ICD-10-CM | POA: Diagnosis not present

## 2018-04-03 DIAGNOSIS — R404 Transient alteration of awareness: Secondary | ICD-10-CM | POA: Diagnosis not present

## 2018-04-03 DIAGNOSIS — Z7982 Long term (current) use of aspirin: Secondary | ICD-10-CM | POA: Diagnosis not present

## 2018-04-03 DIAGNOSIS — F411 Generalized anxiety disorder: Secondary | ICD-10-CM | POA: Diagnosis present

## 2018-04-03 DIAGNOSIS — Z7902 Long term (current) use of antithrombotics/antiplatelets: Secondary | ICD-10-CM | POA: Insufficient documentation

## 2018-04-03 DIAGNOSIS — J45909 Unspecified asthma, uncomplicated: Secondary | ICD-10-CM | POA: Insufficient documentation

## 2018-04-03 DIAGNOSIS — R079 Chest pain, unspecified: Secondary | ICD-10-CM | POA: Diagnosis not present

## 2018-04-03 DIAGNOSIS — R4182 Altered mental status, unspecified: Secondary | ICD-10-CM | POA: Diagnosis not present

## 2018-04-03 DIAGNOSIS — G459 Transient cerebral ischemic attack, unspecified: Secondary | ICD-10-CM

## 2018-04-03 LAB — DIFFERENTIAL
BASOS PCT: 0 %
Basophils Absolute: 0 10*3/uL (ref 0.0–0.1)
EOS ABS: 0.2 10*3/uL (ref 0.0–0.7)
EOS PCT: 1 %
LYMPHS PCT: 18 %
Lymphs Abs: 2 10*3/uL (ref 0.7–4.0)
MONO ABS: 0.5 10*3/uL (ref 0.1–1.0)
Monocytes Relative: 5 %
Neutro Abs: 8.4 10*3/uL — ABNORMAL HIGH (ref 1.7–7.7)
Neutrophils Relative %: 76 %

## 2018-04-03 LAB — COMPREHENSIVE METABOLIC PANEL
ALK PHOS: 99 U/L (ref 38–126)
ALT: 16 U/L (ref 0–44)
ANION GAP: 9 (ref 5–15)
AST: 27 U/L (ref 15–41)
Albumin: 4 g/dL (ref 3.5–5.0)
BUN: 8 mg/dL (ref 6–20)
CHLORIDE: 105 mmol/L (ref 98–111)
CO2: 28 mmol/L (ref 22–32)
CREATININE: 0.63 mg/dL (ref 0.44–1.00)
Calcium: 9.7 mg/dL (ref 8.9–10.3)
GFR calc non Af Amer: >60 mL/min (ref >60)
Glucose, Bld: 127 mg/dL — ABNORMAL HIGH (ref 70–99)
POTASSIUM: 3.7 mmol/L (ref 3.5–5.1)
SODIUM: 142 mmol/L (ref 135–145)
Total Bilirubin: 0.8 mg/dL (ref 0.3–1.2)
Total Protein: 8.6 g/dL — ABNORMAL HIGH (ref 6.5–8.1)

## 2018-04-03 LAB — RAPID URINE DRUG SCREEN, HOSP PERFORMED
Amphetamines: NOT DETECTED
Barbiturates: NOT DETECTED
Benzodiazepines: POSITIVE — AB
COCAINE: NOT DETECTED
OPIATES: POSITIVE — AB
TETRAHYDROCANNABINOL: NOT DETECTED

## 2018-04-03 LAB — URINALYSIS, ROUTINE W REFLEX MICROSCOPIC
BILIRUBIN URINE: NEGATIVE
Glucose, UA: NEGATIVE mg/dL
Hgb urine dipstick: NEGATIVE
KETONES UR: NEGATIVE mg/dL
Nitrite: NEGATIVE
PROTEIN: NEGATIVE mg/dL
Specific Gravity, Urine: 1.006 (ref 1.005–1.030)
pH: 7 (ref 5.0–8.0)

## 2018-04-03 LAB — TROPONIN I

## 2018-04-03 LAB — CBC
HCT: 39.8 % (ref 36.0–46.0)
Hemoglobin: 12.5 g/dL (ref 12.0–15.0)
MCH: 26.8 pg (ref 26.0–34.0)
MCHC: 31.4 g/dL (ref 30.0–36.0)
MCV: 85.4 fL (ref 78.0–100.0)
PLATELETS: 299 10*3/uL (ref 150–400)
RBC: 4.66 MIL/uL (ref 3.87–5.11)
RDW: 14.4 % (ref 11.5–15.5)
WBC: 11.2 10*3/uL — ABNORMAL HIGH (ref 4.0–10.5)

## 2018-04-03 LAB — CBG MONITORING, ED: GLUCOSE-CAPILLARY: 123 mg/dL — AB (ref 70–99)

## 2018-04-03 LAB — PROTIME-INR
INR: 0.99
PROTHROMBIN TIME: 13 s (ref 11.4–15.2)

## 2018-04-03 LAB — APTT: aPTT: 28 seconds (ref 24–36)

## 2018-04-03 LAB — ETHANOL

## 2018-04-03 MED ORDER — ACETAMINOPHEN 650 MG RE SUPP: 650.0000 mg | RECTAL | Status: DC | PRN

## 2018-04-03 MED ORDER — ASPIRIN 325 MG PO TABS
325.0000 mg | ORAL_TABLET | Freq: Every day | ORAL | Status: DC
  Administered 2018-04-03 – 2018-04-04 (×2): 325 mg via ORAL
  Filled 2018-04-03 (×2): qty 1

## 2018-04-03 MED ORDER — ASPIRIN 300 MG RE SUPP: 300.0000 mg | Freq: Every day | RECTAL | Status: DC

## 2018-04-03 MED ORDER — OMEGA-3-ACID ETHYL ESTERS 1 G PO CAPS
1.0000 g | ORAL_CAPSULE | Freq: Every day | ORAL | Status: DC
  Administered 2018-04-04: 1 g via ORAL
  Filled 2018-04-03: qty 1

## 2018-04-03 MED ORDER — KRILL OIL 300 MG PO CAPS: 1.0000 | ORAL_CAPSULE | Freq: Every day | ORAL | Status: DC

## 2018-04-03 MED ORDER — FUROSEMIDE 20 MG PO TABS: 20.0000 mg | ORAL_TABLET | Freq: Every day | ORAL | Status: DC | PRN

## 2018-04-03 MED ORDER — LEVOTHYROXINE SODIUM 25 MCG PO TABS
25.0000 ug | ORAL_TABLET | Freq: Every day | ORAL | Status: DC
  Administered 2018-04-04: 25 ug via ORAL
  Filled 2018-04-03: qty 1

## 2018-04-03 MED ORDER — CIPROFLOXACIN HCL 250 MG PO TABS
500.0000 mg | ORAL_TABLET | Freq: Two times a day (BID) | ORAL | Status: DC
  Administered 2018-04-03 – 2018-04-04 (×2): 500 mg via ORAL
  Filled 2018-04-03 (×2): qty 2

## 2018-04-03 MED ORDER — ARIPIPRAZOLE 2 MG PO TABS
2.0000 mg | ORAL_TABLET | Freq: Every evening | ORAL | Status: DC
  Administered 2018-04-03: 2 mg via ORAL
  Filled 2018-04-03 (×4): qty 1

## 2018-04-03 MED ORDER — ACETAMINOPHEN 325 MG PO TABS
650.0000 mg | ORAL_TABLET | ORAL | Status: DC | PRN
Start: 2018-04-03 — End: 2018-04-04

## 2018-04-03 MED ORDER — ACETAMINOPHEN 160 MG/5ML PO SOLN: 650.0000 mg | ORAL | Status: DC | PRN

## 2018-04-03 MED ORDER — PRAVASTATIN SODIUM 40 MG PO TABS
80.0000 mg | ORAL_TABLET | Freq: Every evening | ORAL | Status: DC
  Administered 2018-04-03: 80 mg via ORAL
  Filled 2018-04-03: qty 2

## 2018-04-03 MED ORDER — GABAPENTIN 400 MG PO CAPS
400.0000 mg | ORAL_CAPSULE | Freq: Three times a day (TID) | ORAL | Status: DC
  Administered 2018-04-03 – 2018-04-04 (×3): 400 mg via ORAL
  Filled 2018-04-03 (×3): qty 1

## 2018-04-03 MED ORDER — STROKE: EARLY STAGES OF RECOVERY BOOK
Freq: Once | Status: AC
  Administered 2018-04-03: 21:00:00
  Filled 2018-04-03 (×2): qty 1

## 2018-04-03 MED ORDER — CITALOPRAM HYDROBROMIDE 20 MG PO TABS
40.0000 mg | ORAL_TABLET | Freq: Every day | ORAL | Status: DC
  Administered 2018-04-04: 40 mg via ORAL
  Filled 2018-04-03: qty 1
  Filled 2018-04-03: qty 2

## 2018-04-03 MED ORDER — CYCLOBENZAPRINE HCL 10 MG PO TABS: 10.0000 mg | ORAL_TABLET | Freq: Three times a day (TID) | ORAL | Status: DC | PRN

## 2018-04-03 MED ORDER — DOCUSATE SODIUM 100 MG PO CAPS: 100.0000 mg | ORAL_CAPSULE | Freq: Every day | ORAL | Status: DC | PRN

## 2018-04-03 MED ORDER — ALBUTEROL SULFATE (2.5 MG/3ML) 0.083% IN NEBU: 3.0000 mL | INHALATION_SOLUTION | Freq: Four times a day (QID) | RESPIRATORY_TRACT | Status: DC | PRN

## 2018-04-03 MED ORDER — ALBUTEROL SULFATE 2 MG PO TABS
2.0000 mg | ORAL_TABLET | Freq: Four times a day (QID) | ORAL | Status: DC
  Administered 2018-04-04 (×3): 2 mg via ORAL
  Filled 2018-04-03 (×12): qty 1

## 2018-04-03 NOTE — ED Notes (Signed)
Patient transported to X-ray 

## 2018-04-03 NOTE — ED Notes (Signed)
ED Provider at bedside. 

## 2018-04-03 NOTE — ED Notes (Signed)
Patient transported to CT 

## 2018-04-03 NOTE — ED Provider Notes (Addendum)
[facility] EMERGENCY DEPARTMENT Provider Note   CSN: [id number] Arrival date & time: 04/03/18  1520     History   Chief Complaint Chief Complaint  Patient presents with  . Altered Mental Status    HPI HAELEE Marks is a 62 y.o. female.  HPI Patient presents with difficulty walking and some altered mental status.  States she feels bad.  Somewhat difficult to get a history from but sounds as if she has been a little off all day today.  More normal last night.  Reportedly had some difficulty walking into a restaurant today.  Difficulty moving her right leg.  Does have chronic bad knees on both sides and does have some difficulty walking at baseline sometimes.  Has had a severe headache also.  Right-sided headache.  Tends to get "migraines".  States this is a typical headache for her.  States she has not had weakness with a headache before.  Had some urinary incontinence today.  That also is not that unusual for her.  No vision changes.  Has an apparent facial droop.  Patient's family with her states this looks like her regular face however.  Patient states she had some chest pain when she got here but no pain now. Past Medical History:  Diagnosis Date  . Acid reflux   . Asthma   . Falls frequently   . High cholesterol   . Migraines   . Pancreatitis, acute     Patient Active Problem List   Diagnosis Date Noted  . Low back pain 07/08/2013  . Lumbar spondylosis 07/08/2013  . OA (osteoarthritis) of knee 02/11/2013  . Acute medial meniscus tear of left knee 01/16/2013  . ANXIETY 03/18/2009  . DEPRESSION 03/18/2009  . GERD 03/18/2009  . CONSTIPATION 03/18/2009  . FATTY LIVER DISEASE 03/18/2009  . BILIARY COLIC 99/37/1696  . PANCREATITIS 03/18/2009  . UTI 03/18/2009    Past Surgical History:  Procedure Laterality Date  . ABDOMINAL HYSTERECTOMY     Question abdominal  . CESAREAN SECTION    . FOOT SURGERY    . GALLBLADDER SURGERY       OB History   None       Home Medications    Prior to Admission medications   Medication Sig Start Date End Date Taking? Authorizing Provider  albuterol (PROVENTIL HFA;VENTOLIN HFA) 108 (90 Base) MCG/ACT inhaler Inhale 1-2 puffs into the lungs every 6 (six) hours as needed for wheezing or shortness of breath.   Yes [provider]  albuterol (PROVENTIL) 2 MG tablet Take 2 mg by mouth 4 (four) times daily.    Yes [provider]  ALPRAZolam Duanne Moron) 1 MG tablet Take 1 tablet by mouth 4 (four) times daily as needed for anxiety.  07/21/13  Yes [provider]  ARIPiprazole (ABILIFY) 2 MG tablet Take 2 mg by mouth every evening.   Yes [provider]  ciprofloxacin (CIPRO) 500 MG tablet Take 500 mg by mouth 2 (two) times daily. for 7 days 03/21/18  Yes [provider]  citalopram (CELEXA) 40 MG tablet Take 1 tablet by mouth daily. 07/15/13  Yes [provider]  cyclobenzaprine (FLEXERIL) 10 MG tablet Take 10 mg by mouth 3 (three) times daily as needed for muscle spasms.    Yes [provider]  docusate sodium (COLACE) 100 MG capsule Take 100 mg by mouth daily as needed for mild constipation.   Yes [provider]  furosemide (LASIX) 20 MG tablet Take 1 tablet  by mouth daily as needed for fluid or edema.  07/18/13  Yes [provider]  gabapentin (NEURONTIN) 400 MG capsule Take 1 capsule by mouth 3 (three) times daily. 07/13/13  Yes [provider]  HYDROcodone-acetaminophen (NORCO/VICODIN) 5-325 MG per tablet Take 2 tablets by mouth every 4 (four) hours as needed. Patient taking differently: Take 1 tablet by mouth 4 (four) times daily as needed for moderate pain.  07/23/13  Yes Tanna Furry, MD  Krill Oil 300 MG CAPS Take 1 capsule by mouth daily.   Yes [provider]  levothyroxine (SYNTHROID, LEVOTHROID) 25 MCG tablet Take 25 mcg by mouth daily before breakfast.   Yes [provider]  meclizine (ANTIVERT) 25 MG  tablet Take 1 tablet (25 mg total) by mouth 4 (four) times daily. Patient taking differently: Take 25 mg by mouth 2 (two) times daily.  11/21/14  Yes Nat Christen, MD  omeprazole (PRILOSEC) 40 MG capsule Take 1 capsule by mouth daily. 07/18/13  Yes [provider]  ondansetron (ZOFRAN) 8 MG tablet Take 1 tablet (8 mg total) by mouth every 4 (four) hours as needed. 11/21/14  Yes Nat Christen, MD  pravastatin (PRAVACHOL) 80 MG tablet Take 1 tablet by mouth every evening.  07/18/13  Yes [provider]  zolpidem (AMBIEN) 5 MG tablet Take 5 mg by mouth at bedtime.    Yes [provider]    Family History History reviewed. No pertinent family history.  Social History Social History   Tobacco Use  . Smoking status: Never Smoker  . Smokeless tobacco: Never Used  Substance Use Topics  . Alcohol use: No  . Drug use: No     Allergies   Acetaminophen-codeine; Diclofenac potassium; Penicillins; Sulfonamide derivatives; and Tetracycline   Review of Systems Review of Systems  Constitutional: Negative for appetite change.  HENT: Negative for congestion.   Respiratory: Negative for shortness of breath.   Cardiovascular: Positive for chest pain.  Gastrointestinal: Negative for abdominal pain.  Genitourinary: Negative for flank pain.  Musculoskeletal:       Pain in bilateral knees.  Skin: Negative for rash.  Neurological: Positive for weakness.  Psychiatric/Behavioral: Positive for confusion.     Physical Exam Updated Vital Signs BP (!) 111/43   Pulse 91   Temp 98.9 F (37.2 C) (Oral)   Resp 17   Ht 5\' 6"  (1.676 m)   Wt 84.8 kg (187 lb)   SpO2 95%   BMI 30.18 kg/m   Physical Exam  Constitutional: She appears well-developed.  HENT:  Head: Normocephalic.  Left-sided facial droop.  Eyes: EOM are normal.  Neck: Neck supple.  Cardiovascular: Normal rate.  Pulmonary/Chest: Effort normal.  Abdominal: There is no tenderness.  Musculoskeletal: She  exhibits no tenderness.  Neurological: She is alert.  Mild confusion and somewhat slow to answer.  Good grip strength bilaterally.  Finger nose a little more unsteady on right side compared to left.  Able to straight leg raise on both sides.  Sensation grossly intact over all extremities.  Patient appears uncomfortable.  Skin: Skin is warm. Capillary refill takes less than 2 seconds.     ED Treatments / Results  Labs (all labs ordered are listed, but only abnormal results are displayed) Labs Reviewed  CBC - Abnormal; Notable for the following components:      Result Value   WBC 11.2 (*)    All other components within normal limits  DIFFERENTIAL - Abnormal; Notable for the following components:  Neutro Abs 8.4 (*)    All other components within normal limits  COMPREHENSIVE METABOLIC PANEL - Abnormal; Notable for the following components:   Glucose, Bld 127 (*)    Total Protein 8.6 (*)    All other components within normal limits  RAPID URINE DRUG SCREEN, HOSP PERFORMED - Abnormal; Notable for the following components:   Opiates POSITIVE (*)    Benzodiazepines POSITIVE (*)    All other components within normal limits  URINALYSIS, ROUTINE W REFLEX MICROSCOPIC - Abnormal; Notable for the following components:   Color, Urine STRAW (*)    APPearance CLOUDY (*)    Leukocytes, UA TRACE (*)    Bacteria, UA RARE (*)    Non Squamous Epithelial 0-5 (*)    All other components within normal limits  CBG MONITORING, ED - Abnormal; Notable for the following components:   Glucose-Capillary 123 (*)    All other components within normal limits  ETHANOL  PROTIME-INR  APTT  TROPONIN I    EKG None  Radiology Dg Chest 2 View  Result Date: 04/03/2018 CLINICAL DATA:  Altered mental status and difficulty walking. EXAM: CHEST - 2 VIEW COMPARISON:  11/11/2016 FINDINGS: The heart size and mediastinal contours are within normal limits. Low bilateral lung volumes. There is no evidence of pulmonary  edema, consolidation, pneumothorax, nodule or pleural fluid. The visualized skeletal structures are unremarkable. IMPRESSION: No active cardiopulmonary disease. Electronically Signed   By: Aletta Edouard M.D.   On: 04/03/2018 16:47   Ct Head Wo Contrast  Result Date: 04/03/2018 CLINICAL DATA:  Patient with altered mental status. Difficulty walking. Patient normal this morning. Weakness. EXAM: CT HEAD WITHOUT CONTRAST TECHNIQUE: Contiguous axial images were obtained from the base of the skull through the vertex without intravenous contrast. COMPARISON:  None. FINDINGS: Brain: No subdural, epidural, or subarachnoid hemorrhage identified. No mass effect or midline shift. Ventricles and sulci are unchanged. Cerebellum, brainstem, and basal cisterns are normal. Minimal white matter changes. No acute cortical ischemia or infarct. Vascular: No hyperdense vessel or unexpected calcification. Skull: Normal. Negative for fracture or focal lesion. Sinuses/Orbits: No acute finding. Other: None. IMPRESSION: No acute intracranial abnormalities to explain the patient's symptoms. Mild white matter changes. Electronically Signed   By: Dorise Bullion III M.D   On: 04/03/2018 17:00    Procedures Procedures (including critical care time)  Medications Ordered in ED Medications - No data to display   Initial Impression / Assessment and Plan / ED Course  I have reviewed the triage vital signs and the nursing notes.  Pertinent labs & imaging results that were available during my care of the patient were reviewed by me and considered in my medical decision making (see chart for details).     Patient presented with some confusion and right sided weakness.  Last normal sounds like it was last night.  Also had headache and chest pain.  Head CT done due to reported difficulty walking and the fact that her finger-nose was off a little bit on the right compared to the left.  Head CT was reassuring.  Not a TPA candidate due  to time of onset.  There is thought of a complicated migraine with this.  Headache has improved and states she feels much better.  Still does have somewhat of a facial droop which is unsure if this is new or old.  Moving right side better now.  Think patient would benefit from admission to the hospital for work-up of transient neurologic deficits and  headache.  Do not think this is a subarachnoid hemorrhage.  Will admit to hospitalist.  Does have white cells in the urine but only rare bacteria.  Had had some urinary incontinence which is not unusual for the patient.  Discussed with Dr. Shanon Brow from hospitalist.  Recommends we get MRI, which is not at the hospital anymore now and get a tele-neurology consult.  Patient had however said earlier she thought she may have difficulty with MRI.  Discussed with tele-neurologist.  He recommends inpatient evaluation for TIA and MRI with neurology consult.  Final Clinical Impressions(s) / ED Diagnoses   Final diagnoses:  Transient alteration of awareness  Weakness  Nonintractable headache, unspecified chronicity pattern, unspecified headache type    ED Discharge Orders    None       Davonna Belling, MD 04/03/18 1851    Davonna Belling, MD 04/03/18 [place]    Davonna Belling, MD 04/03/18 [phone]

## 2018-04-03 NOTE — ED Notes (Signed)
Family at bedside. 

## 2018-04-03 NOTE — H&P (Signed)
History and Physical    [name] ZHY:[id number] DOB: 10-11-1955 DOA: 04/03/2018  PCP: Redmond School, MD  Patient coming from: Home  Chief Complaint: Legs weak  HPI: [name] is a 62 y.o. female with medical history significant of  Anxiety, asthma brought in by family because of change in her gait.  Patient seems a little altered to me right now but her husband is present and says this is normal for her.  She does not recall anything that happened earlier.  She denies any focal numbness tingling or weakness anywhere.  Husband reports she has chronic problems with her knees and has difficulty walking normally.  Today it seemed more worse than normal.  Patient does have a history of chronic migraines but they are usually not complex and have any other focal neurological deficits with it.  She has been having migraines but none right now.  Patient is being referred for admission for possible TIA.  Telemetry neurology was consulted who also recommended a full neurological work-up.  Review of Systems: As per HPI otherwise 10 point review of systems negative.   Past Medical History:  Diagnosis Date  . Acid reflux   . Asthma   . Falls frequently   . High cholesterol   . Migraines   . Pancreatitis, acute     Past Surgical History:  Procedure Laterality Date  . ABDOMINAL HYSTERECTOMY     Question abdominal  . CESAREAN SECTION    . FOOT SURGERY    . GALLBLADDER SURGERY       reports that she has never smoked. She has never used smokeless tobacco. She reports that she does not drink alcohol or use drugs.  Allergies  Allergen Reactions  . Acetaminophen-Codeine Other (See Comments)    Made patient talk out of her head and have slurred speech.  . Diclofenac Potassium Other (See Comments)    cramps  . Penicillins     REACTION: unknown reaction  . Sulfonamide Derivatives     REACTION: unknown reacton  . Tetracycline     REACTION: unknown reaction    History  reviewed. No pertinent family history.  Prior to Admission medications   Medication Sig Start Date End Date Taking? Authorizing Provider  albuterol (PROVENTIL HFA;VENTOLIN HFA) 108 (90 Base) MCG/ACT inhaler Inhale 1-2 puffs into the lungs every 6 (six) hours as needed for wheezing or shortness of breath.   Yes [provider]  albuterol (PROVENTIL) 2 MG tablet Take 2 mg by mouth 4 (four) times daily.    Yes [provider]  ALPRAZolam Duanne Moron) 1 MG tablet Take 1 tablet by mouth 4 (four) times daily as needed for anxiety.  07/21/13  Yes [provider]  ARIPiprazole (ABILIFY) 2 MG tablet Take 2 mg by mouth every evening.   Yes [provider]  ciprofloxacin (CIPRO) 500 MG tablet Take 500 mg by mouth 2 (two) times daily. for 7 days 03/21/18  Yes [provider]  citalopram (CELEXA) 40 MG tablet Take 1 tablet by mouth daily. 07/15/13  Yes [provider]  cyclobenzaprine (FLEXERIL) 10 MG tablet Take 10 mg by mouth 3 (three) times daily as needed for muscle spasms.    Yes [provider]  docusate sodium (COLACE) 100 MG capsule Take 100 mg by mouth daily as needed for mild constipation.   Yes [provider]  furosemide (LASIX) 20 MG tablet Take 1 tablet by mouth daily as needed for fluid or edema.  07/18/13  Yes [provider]  gabapentin (NEURONTIN) 400 MG capsule Take 1 capsule by mouth 3 (three) times daily. 07/13/13  Yes [provider]  HYDROcodone-acetaminophen (NORCO/VICODIN) 5-325 MG per tablet Take 2 tablets by mouth every 4 (four) hours as needed. Patient taking differently: Take 1 tablet by mouth 4 (four) times daily as needed for moderate pain.  07/23/13  Yes Tanna Furry, MD  Krill Oil 300 MG CAPS Take 1 capsule by mouth daily.   Yes [provider]  levothyroxine (SYNTHROID, LEVOTHROID) 25 MCG tablet Take 25 mcg by mouth daily before breakfast.   Yes [provider]  meclizine  (ANTIVERT) 25 MG tablet Take 1 tablet (25 mg total) by mouth 4 (four) times daily. Patient taking differently: Take 25 mg by mouth 2 (two) times daily.  11/21/14  Yes Nat Christen, MD  omeprazole (PRILOSEC) 40 MG capsule Take 1 capsule by mouth daily. 07/18/13  Yes [provider]  ondansetron (ZOFRAN) 8 MG tablet Take 1 tablet (8 mg total) by mouth every 4 (four) hours as needed. 11/21/14  Yes Nat Christen, MD  pravastatin (PRAVACHOL) 80 MG tablet Take 1 tablet by mouth every evening.  07/18/13  Yes [provider]  zolpidem (AMBIEN) 5 MG tablet Take 5 mg by mouth at bedtime.    Yes [provider]    Physical Exam: Vitals:   04/03/18 1830 04/03/18 1900 04/03/18 1930 04/03/18 2000  BP: (!) 114/55 (!) 111/43 121/61 127/65  Pulse: 92 91 91 94  Resp: 19 17 20 17   Temp:      TempSrc:      SpO2: 96% 95% 95% 91%  Weight:      Height:          Constitutional: NAD, calm, comfortable Vitals:   04/03/18 1830 04/03/18 1900 04/03/18 1930 04/03/18 2000  BP: (!) 114/55 (!) 111/43 121/61 127/65  Pulse: 92 91 91 94  Resp: 19 17 20 17   Temp:      TempSrc:      SpO2: 96% 95% 95% 91%  Weight:      Height:       Eyes: PERRL, lids and conjunctivae normal ENMT: Mucous membranes are moist. Posterior pharynx clear of any exudate or lesions.Normal dentition.  Neck: normal, supple, no masses, no thyromegaly Respiratory: clear to auscultation bilaterally, no wheezing, no crackles. Normal respiratory effort. No accessory muscle use.  Cardiovascular: Regular rate and rhythm, no murmurs / rubs / gallops. No extremity edema. 2+ pedal pulses. No carotid bruits.  Abdomen: no tenderness, no masses palpated. No hepatosplenomegaly. Bowel sounds positive.  Musculoskeletal: no clubbing / cyanosis. No joint deformity upper and lower extremities. Good ROM, no contractures. Normal muscle tone.  Skin: no rashes, lesions, ulcers. No induration Neurologic: CN 2-12 grossly intact. Sensation  intact, DTR normal. Strength 5/5 in all 4.  Psychiatric: Normal judgment and insight. Alert and oriented x 3. Normal mood.    Labs on Admission: I have personally reviewed following labs and imaging studies  CBC: Recent Labs  Lab 04/03/18 1610  WBC 11.2*  NEUTROABS 8.4*  HGB 12.5  HCT 39.8  MCV 85.4  PLT 341   Basic Metabolic Panel: Recent Labs  Lab 04/03/18 1610  NA 142  K 3.7  CL 105  CO2 28  GLUCOSE 127*  BUN 8  CREATININE 0.63  CALCIUM 9.7   GFR: Estimated Creatinine Clearance: 82 mL/min (by C-G formula based on SCr of 0.63 mg/dL). Liver Function Tests: Recent Labs  Lab 04/03/18  1610  AST 27  ALT 16  ALKPHOS 99  BILITOT 0.8  PROT 8.6*  ALBUMIN 4.0   No results for input(s): LIPASE, AMYLASE in the last 168 hours. No results for input(s): AMMONIA in the last 168 hours. Coagulation Profile: Recent Labs  Lab 04/03/18 1610  INR 0.99   Cardiac Enzymes: Recent Labs  Lab 04/03/18 1610  TROPONINI <0.03   BNP (last 3 results) No results for input(s): PROBNP in the last 8760 hours. HbA1C: No results for input(s): HGBA1C in the last 72 hours. CBG: Recent Labs  Lab 04/03/18 1534  GLUCAP 123*   Lipid Profile: No results for input(s): CHOL, HDL, LDLCALC, TRIG, CHOLHDL, LDLDIRECT in the last 72 hours. Thyroid Function Tests: No results for input(s): TSH, T4TOTAL, FREET4, T3FREE, THYROIDAB in the last 72 hours. Anemia Panel: No results for input(s): VITAMINB12, FOLATE, FERRITIN, TIBC, IRON, RETICCTPCT in the last 72 hours. Urine analysis:    Component Value Date/Time   COLORURINE STRAW (A) 04/03/2018 1610   APPEARANCEUR CLOUDY (A) 04/03/2018 1610   LABSPEC 1.006 04/03/2018 1610   PHURINE 7.0 04/03/2018 1610   GLUCOSEU NEGATIVE 04/03/2018 1610   HGBUR NEGATIVE 04/03/2018 1610   BILIRUBINUR NEGATIVE 04/03/2018 1610   KETONESUR NEGATIVE 04/03/2018 1610   PROTEINUR NEGATIVE 04/03/2018 1610   UROBILINOGEN 0.2 07/23/2013 1513   NITRITE NEGATIVE  04/03/2018 1610   LEUKOCYTESUR TRACE (A) 04/03/2018 1610   Sepsis Labs: !!!!!!!!!!!!!!!!!!!!!!!!!!!!!!!!!!!!!!!!!!!! @LABRCNTIP (procalcitonin:4,lacticidven:4) )No results found for this or any previous visit (from the past 240 hour(s)).   Radiological Exams on Admission: Dg Chest 2 View  Result Date: 04/03/2018 CLINICAL DATA:  Altered mental status and difficulty walking. EXAM: CHEST - 2 VIEW COMPARISON:  11/11/2016 FINDINGS: The heart size and mediastinal contours are within normal limits. Low bilateral lung volumes. There is no evidence of pulmonary edema, consolidation, pneumothorax, nodule or pleural fluid. The visualized skeletal structures are unremarkable. IMPRESSION: No active cardiopulmonary disease. Electronically Signed   By: Aletta Edouard M.D.   On: 04/03/2018 16:47   Ct Head Wo Contrast  Result Date: 04/03/2018 CLINICAL DATA:  Patient with altered mental status. Difficulty walking. Patient normal this morning. Weakness. EXAM: CT HEAD WITHOUT CONTRAST TECHNIQUE: Contiguous axial images were obtained from the base of the skull through the vertex without intravenous contrast. COMPARISON:  None. FINDINGS: Brain: No subdural, epidural, or subarachnoid hemorrhage identified. No mass effect or midline shift. Ventricles and sulci are unchanged. Cerebellum, brainstem, and basal cisterns are normal. Minimal white matter changes. No acute cortical ischemia or infarct. Vascular: No hyperdense vessel or unexpected calcification. Skull: Normal. Negative for fracture or focal lesion. Sinuses/Orbits: No acute finding. Other: None. IMPRESSION: No acute intracranial abnormalities to explain the patient's symptoms. Mild white matter changes. Electronically Signed   By: Dorise Bullion III M.D   On: 04/03/2018 17:00    EKG: Independently reviewed.  Sinus tach rate of 101 with no acute changes  Old chart reviewed  Case discussed Dr. Alvino Chapel in the ED  Chest x-ray reviewed no edema no  infiltrate  Assessment/Plan 62 year old female with instability of gait and confusion telemetry neurology consulted recommending full TIA work-up  Principal Problem:   Confusion-with instability of gait.-Symptoms have resolved.  Placed on aspirin.  Obtain MRI brain.  Obtain cardiac echo.  Obtain carotid Dopplers.  This is likely due to polypharmacy.  Hold all sedatives.  Active Problems:   Anxiety state-noted     DVT prophylaxis: SCDs Code Status: Full  Family Communication: Husband Disposition Plan: Likely tomorrow  Consults called: None Admission status: Observation  [name] A MD [facility]  If 7PM-7AM, please contact night-coverage [email or web address] Password [email or web address]  04/03/2018, 8:03 PM

## 2018-04-03 NOTE — ED Triage Notes (Signed)
Patient's family states patient has altered mental status with difficulty walking noticed at 1100 by a friend. Family member states she last saw patient normal at 0900 this morning. No facial droop. Equal strength in grips bilaterally. Patient is disoriented to date. Complaining of pain to chest at triage.

## 2018-04-04 ENCOUNTER — Observation Stay (HOSPITAL_COMMUNITY): Payer: Medicare Other

## 2018-04-04 ENCOUNTER — Observation Stay (HOSPITAL_BASED_OUTPATIENT_CLINIC_OR_DEPARTMENT_OTHER): Payer: Medicare Other

## 2018-04-04 DIAGNOSIS — F411 Generalized anxiety disorder: Secondary | ICD-10-CM | POA: Diagnosis not present

## 2018-04-04 DIAGNOSIS — R410 Disorientation, unspecified: Secondary | ICD-10-CM | POA: Diagnosis not present

## 2018-04-04 DIAGNOSIS — G459 Transient cerebral ischemic attack, unspecified: Secondary | ICD-10-CM | POA: Diagnosis not present

## 2018-04-04 DIAGNOSIS — R4182 Altered mental status, unspecified: Secondary | ICD-10-CM | POA: Diagnosis not present

## 2018-04-04 DIAGNOSIS — I6523 Occlusion and stenosis of bilateral carotid arteries: Secondary | ICD-10-CM | POA: Diagnosis not present

## 2018-04-04 DIAGNOSIS — R9431 Abnormal electrocardiogram [ECG] [EKG]: Secondary | ICD-10-CM

## 2018-04-04 LAB — ECHOCARDIOGRAM COMPLETE
Height: 66 in
Weight: 2934.76 oz

## 2018-04-04 LAB — LIPID PANEL
CHOL/HDL RATIO: 4.9 ratio
CHOLESTEROL: 136 mg/dL (ref 0–200)
HDL: 28 mg/dL — ABNORMAL LOW (ref >40)
LDL CALC: 79 mg/dL (ref 0–99)
Triglycerides: 145 mg/dL (ref <150)
VLDL: 29 mg/dL (ref 0–40)

## 2018-04-04 LAB — HEMOGLOBIN A1C
Hgb A1c MFr Bld: 6.2 % — ABNORMAL HIGH (ref 4.8–5.6)
Mean Plasma Glucose: 131.24 mg/dL

## 2018-04-04 MED ORDER — ASPIRIN EC 81 MG PO TBEC: 81.0000 mg | DELAYED_RELEASE_TABLET | Freq: Every day | ORAL | 2 refills | Status: AC

## 2018-04-04 NOTE — Discharge Instructions (Signed)
Recommendations for Outpatient Follow-up:  F/u with PCP 3-7 days, with echo results

## 2018-04-04 NOTE — Progress Notes (Signed)
Discharge instructions gone over with patient, verbalized understanding. IV removed, patient tolerated procedure well. 

## 2018-04-04 NOTE — Evaluation (Signed)
Physical Therapy Evaluation Patient Details Name: [name] MRN: [id number] DOB: 11-Jul-1956 Today's Date: 04/04/2018   History of Present Illness  [name] is a 62 y.o. female with medical history significant of  Anxiety, asthma brought in by family because of change in her gait.  Patient seems a little altered to me right now but her husband is present and says this is normal for her.  She does not recall anything that happened earlier.  She denies any focal numbness tingling or weakness anywhere.  Husband reports she has chronic problems with her knees and has difficulty walking normally.  Today it seemed more worse than normal.  Patient does have a history of chronic migraines but they are usually not complex and have any other focal neurological deficits with it.  She has been having migraines but none right now.  Patient is being referred for admission for possible TIA.  Telemetry neurology was consulted who also recommended a full neurological work-up.    Clinical Impression  Patient functioning near baseline for functional mobility and gait, demonstrates slow labored movement for sit to stands and ambulates with slow cadence, limited arm swing and short step/stride length which is near normal per patient.  Patient tolerated sitting up in chair after therapy - RN notified.  Patient will benefit from continued physical therapy in hospital and recommended venue below to increase strength, balance, endurance for safe ADLs and gait.     Follow Up Recommendations Home health PT;Supervision for mobility/OOB    Equipment Recommendations  None recommended by PT    Recommendations for Other Services       Precautions / Restrictions Precautions Precautions: Fall Restrictions Weight Bearing Restrictions: No      Mobility  Bed Mobility Overal bed mobility: Modified Independent             General bed mobility comments: increased time, uses siderail for supine to  sit  Transfers Overall transfer level: Needs assistance Equipment used: None Transfers: Sit to/from Stand;Stand Pivot Transfers Sit to Stand: Supervision Stand pivot transfers: Supervision       General transfer comment: increased time, slow labored movement  Ambulation/Gait Ambulation/Gait assistance: Min guard Gait Distance (Feet): 50 Feet Assistive device: None Gait Pattern/deviations: Decreased step length - right;Decreased step length - left;Decreased stride length Gait velocity: slow   General Gait Details: slow labored movement with short step/stride length, limited armswing, no loss of balance, limited secondary to fatigue  Stairs            Wheelchair Mobility    Modified Rankin (Stroke Patients Only)       Balance Overall balance assessment: Mild deficits observed, not formally tested                                           Pertinent Vitals/Pain Pain Assessment: No/denies pain    Home Living Family/patient expects to be discharged to:: Private residence Living Arrangements: Spouse/significant other;Other relatives Available Help at Discharge: Family Type of Home: House Home Access: Stairs to enter Entrance Stairs-Rails: None Entrance Stairs-Number of Steps: 2 Home Layout: Two level;Able to live on main level with bedroom/bathroom(lives on main level) Home Equipment: Cane - single point;Walker - 2 wheels      Prior Function Level of Independence: Independent with assistive device(s)         Comments: household and short distanced community ambulator,  uses RW at night for sit to stand, then walks without it     Hand Dominance        Extremity/Trunk Assessment   Upper Extremity Assessment Upper Extremity Assessment: Defer to OT evaluation    Lower Extremity Assessment Lower Extremity Assessment: Overall WFL for tasks assessed    Cervical / Trunk Assessment Cervical / Trunk Assessment: Normal  Communication    Communication: No difficulties  Cognition Arousal/Alertness: Awake/alert Behavior During Therapy: WFL for tasks assessed/performed Overall Cognitive Status: Within Functional Limits for tasks assessed                                        General Comments      Exercises     Assessment/Plan    PT Assessment Patient needs continued PT services  PT Problem List Decreased activity tolerance;Decreased balance;Decreased mobility       PT Treatment Interventions Gait training;Stair training;Functional mobility training;Therapeutic activities;Therapeutic exercise;Patient/family education    PT Goals (Current goals can be found in the Care Plan section)  Acute Rehab PT Goals Patient Stated Goal: return home with family to assist PT Goal Formulation: With patient Time For Goal Achievement: 04/11/18 Potential to Achieve Goals: Good    Frequency 7X/week   Barriers to discharge        Co-evaluation               AM-PAC PT "6 Clicks" Daily Activity  Outcome Measure Difficulty turning over in bed (including adjusting bedclothes, sheets and blankets)?: None Difficulty moving from lying on back to sitting on the side of the bed? : None Difficulty sitting down on and standing up from a chair with arms (e.g., wheelchair, bedside commode, etc,.)?: A Little Help needed moving to and from a bed to chair (including a wheelchair)?: A Little Help needed walking in hospital room?: A Little Help needed climbing 3-5 steps with a railing? : A Lot 6 Click Score: 19    End of Session Equipment Utilized During Treatment: Gait belt Activity Tolerance: Patient tolerated treatment well;Patient limited by fatigue Patient left: in chair;with call bell/phone within reach Nurse Communication: Mobility status;Other (comment)(RN notified that patient left up in chair) PT Visit Diagnosis: Unsteadiness on feet (R26.81);Other abnormalities of gait and mobility (R26.89);Muscle weakness  (generalized) (M62.81)    Time: 2080-2233 PT Time Calculation (min) (ACUTE ONLY): 26 min   Charges:   PT Evaluation $PT Eval Moderate Complexity: 1 Mod PT Treatments $Therapeutic Activity: 23-37 mins        9:03 AM, 04/04/18 Lonell Grandchild, MPT Physical Therapist with [facility] [phone] office [phone] mobile phone

## 2018-04-04 NOTE — Evaluation (Signed)
Occupational Therapy Evaluation Patient Details Name: [name] MRN: [id number] DOB: [birth year] Today's Date: 04/04/2018    History of Present Illness [name] is a 62 y.o. female with medical history significant of  Anxiety, asthma brought in by family because of change in her gait.  Patient seems a little altered to me right now but her husband is present and says this is normal for her.  She does not recall anything that happened earlier.  She denies any focal numbness tingling or weakness anywhere.  Husband reports she has chronic problems with her knees and has difficulty walking normally.  Today it seemed more worse than normal.  Patient does have a history of chronic migraines but they are usually not complex and have any other focal neurological deficits with it.  She has been having migraines but none right now.  Patient is being referred for admission for possible TIA.  Telemetry neurology was consulted who also recommended a full neurological work-up.   Clinical Impression   Pt received supine in bed, agreeable to OT evaluation. Pt reporting she feels much improved this am. Pt demonstrating BUE strength WFL at 4/5, sensation and coordination are intact. Pt using RW for standing tasks due to balance deficits. Pt has family available to assist with ADLs as needed, no further OT services required at this time.     Follow Up Recommendations  No OT follow up    Equipment Recommendations  None recommended by OT       Precautions / Restrictions Precautions Precautions: Fall Restrictions Weight Bearing Restrictions: No      Mobility Bed Mobility Overal bed mobility: Modified Independent             General bed mobility comments: increased time, uses siderail for supine to sit  Transfers Overall transfer level: Needs assistance Equipment used: Rolling walker (2 wheeled) Transfers: Sit to/from [facility] Sit to Stand: Supervision Stand  pivot transfers: Supervision       General transfer comment: increased time, slow labored movement        ADL either performed or assessed with clinical judgement   ADL Overall ADL's : Needs assistance/impaired     Grooming: Supervision/safety;Standing;Modified independent;Sitting               Lower Body Dressing: Modified independent;Sitting/lateral leans               Functional mobility during ADLs: Min guard;Rolling walker       Vision Baseline Vision/History: Wears glasses Wears Glasses: Reading only Patient Visual Report: No change from baseline Vision Assessment?: No apparent visual deficits            Pertinent Vitals/Pain Pain Assessment: No/denies pain     Hand Dominance Right   Extremity/Trunk Assessment Upper Extremity Assessment Upper Extremity Assessment: Overall WFL for tasks assessed(BUE strength grossly 4/5)   Lower Extremity Assessment Lower Extremity Assessment: Defer to PT evaluation   Cervical / Trunk Assessment Cervical / Trunk Assessment: Normal   Communication Communication Communication: No difficulties   Cognition Arousal/Alertness: Awake/alert Behavior During Therapy: WFL for tasks assessed/performed Overall Cognitive Status: Within Functional Limits for tasks assessed                                                Home Living Family/patient expects to be discharged to:: Private residence Living  Arrangements: Spouse/significant other;Other relatives Available Help at Discharge: Family Type of Home: House Home Access: Stairs to enter [employer] of Steps: 2 Entrance Stairs-Rails: None Home Layout: Two level;Able to live on main level with bedroom/bathroom Alternate Level Stairs-Number of Steps: 10 Alternate Level Stairs-Rails: Right Bathroom Shower/Tub: Walk-in shower     Bathroom Accessibility: Yes   Home Equipment: Cane - single point;Walker - 2 wheels;Shower seat - built in           Prior Functioning/Environment Level of Independence: Independent with assistive device(s)        Comments: household and short distanced community ambulator, uses RW at night for sit to stand, then walks without it        OT Problem List: Decreased activity tolerance;Impaired balance (sitting and/or standing)      OT Treatment/Interventions:      OT Goals(Current goals can be found in the care plan section) Acute Rehab OT Goals Patient Stated Goal: return home with family to assist  OT Frequency:      AM-PAC PT "6 Clicks" Daily Activity     Outcome Measure Help from another person eating meals?: None Help from another person taking care of personal grooming?: None Help from another person toileting, which includes using toliet, bedpan, or urinal?: A Little Help from another person bathing (including washing, rinsing, drying)?: A Little Help from another person to put on and taking off regular upper body clothing?: None Help from another person to put on and taking off regular lower body clothing?: None 6 Click Score: 22   End of Session Equipment Utilized During Treatment: Gait belt;Rolling walker Nurse Communication: Mobility status  Activity Tolerance: Patient tolerated treatment well Patient left: in chair;with call bell/phone within reach(with PT)  OT Visit Diagnosis: Unsteadiness on feet (R26.81);Muscle weakness (generalized) (M62.81)                Time: 9371-6967 OT Time Calculation (min): 34 min Charges:  OT General Charges $OT Visit: 1 Visit OT Evaluation $OT Eval Low Complexity: [place], OTR/L  [phone] 04/04/2018, 10:19 AM

## 2018-04-04 NOTE — Plan of Care (Signed)
  Problem: Acute Rehab PT Goals(only PT should resolve) Goal: Patient Will Transfer Sit To/From Stand Outcome: Progressing Flowsheets (Taken 04/04/2018 0904) Patient will transfer sit to/from stand: with modified independence Goal: Pt Will Transfer Bed To Chair/Chair To Bed Outcome: Progressing Flowsheets (Taken 04/04/2018 0904) Pt will Transfer Bed to Chair/Chair to Bed: with modified independence Goal: Pt Will Ambulate Outcome: Progressing Flowsheets (Taken 04/04/2018 0904) Pt will Ambulate: 75 feet;with supervision   9:05 AM, 04/04/18 Lonell Grandchild, MPT Physical Therapist with [facility] [phone] office [phone] mobile phone

## 2018-04-04 NOTE — Progress Notes (Signed)
[facility] PROGRESS NOTE  HOA BRIGGS TIR:[id number] DOB: December 23, 1955 DOA: 04/03/2018 PCP: Redmond School, MD  Brief summary   62 y.o. female with medical history significant of Anxiety, depression, hypothyroidisms, asthma, frequent falls, migraines brought in by family because of change in her gait.  Patient seems a little altered to me right now but her husband is present and says this is normal for her.  She does not recall anything that happened earlier.  She denies any focal numbness tingling or weakness anywhere.  Husband reports she has chronic problems with her knees and has difficulty walking normally.  Today it seemed more worse than normal.  Patient does have a history of chronic migraines but they are usually not complex and have any other focal neurological deficits with it.  She has been having migraines but none right now.  Patient is being referred for admission for possible TIA.  Telemetry neurology was consulted who also recommended a full neurological work-up.   Assessment/Plan:  Confusion, reported gait instability prior to admission. Resolved. Possible benzo/opioid effect. Neuro exam is non focal. CT/MRI brain: no acute infarcts. hdl-28, ldl-79. Carotid: lees than 50% BL. Pend echo. Started aspirin, statin.   UTI. on cipro. Afebrile. Stable   Hypothyroidism. Cont levothyroxine.   Anxiety, depression. recommended to decrease opioids, benzo use   Asthma. Stable. No s/s of acute exacerbation. Resume home regimen   Code Status: full Family Communication: d/w patient, RN (indicate person spoken with, relationship, and if by phone, the number) Disposition Plan: home today    Consultants:    Procedures:  Pend echo   Antibiotics:  cipro  (indicate start date, and stop date if known)  HPI/Subjective: Reports feeling much better. No further leg weakness. No new focal neuro symptoms. Wants to go home    Objective: Vitals:   04/04/18 0623 04/04/18  0823  BP: 111/65 115/69  Pulse: 84 88  Resp: 18 18  Temp: 98.5 F (36.9 C) 98.3 F (36.8 C)  SpO2: 99% 100%    Intake/Output Summary (Last 24 hours) at 04/04/2018 1212 Last data filed at 04/04/2018 0900 Gross per 24 hour  Intake 0 ml  Output -  Net 0 ml   Filed Weights   04/03/18 1527 04/03/18 1532 04/03/18 2023  Weight: 84.8 kg (187 lb) 84.8 kg (187 lb) 83.2 kg (183 lb 6.8 oz)    Exam:   General:  No distress   Cardiovascular: s1,s2 rrr  Respiratory: CTA BL  Abdomen: soft, nt   Musculoskeletal: no leg edema    Data Reviewed: Basic Metabolic Panel: Recent Labs  Lab 04/03/18 1610  NA 142  K 3.7  CL 105  CO2 28  GLUCOSE 127*  BUN 8  CREATININE 0.63  CALCIUM 9.7   Liver Function Tests: Recent Labs  Lab 04/03/18 1610  AST 27  ALT 16  ALKPHOS 99  BILITOT 0.8  PROT 8.6*  ALBUMIN 4.0   No results for input(s): LIPASE, AMYLASE in the last 168 hours. No results for input(s): AMMONIA in the last 168 hours. CBC: Recent Labs  Lab 04/03/18 1610  WBC 11.2*  NEUTROABS 8.4*  HGB 12.5  HCT 39.8  MCV 85.4  PLT 299   Cardiac Enzymes: Recent Labs  Lab 04/03/18 1610  TROPONINI <0.03   BNP (last 3 results) No results for input(s): BNP in the last 8760 hours.  ProBNP (last 3 results) No results for input(s): PROBNP in the last 8760 hours.  CBG: Recent Labs  Lab 04/03/18  [place]    No results found for this or any previous visit (from the past 240 hour(s)).   Studies: Dg Chest 2 View  Result Date: 04/03/2018 CLINICAL DATA:  Altered mental status and difficulty walking. EXAM: CHEST - 2 VIEW COMPARISON:  11/11/2016 FINDINGS: The heart size and mediastinal contours are within normal limits. Low bilateral lung volumes. There is no evidence of pulmonary edema, consolidation, pneumothorax, nodule or pleural fluid. The visualized skeletal structures are unremarkable. IMPRESSION: No active cardiopulmonary disease. Electronically Signed   By:  Aletta Edouard M.D.   On: 04/03/2018 16:47   Ct Head Wo Contrast  Result Date: 04/03/2018 CLINICAL DATA:  Patient with altered mental status. Difficulty walking. Patient normal this morning. Weakness. EXAM: CT HEAD WITHOUT CONTRAST TECHNIQUE: Contiguous axial images were obtained from the base of the skull through the vertex without intravenous contrast. COMPARISON:  None. FINDINGS: Brain: No subdural, epidural, or subarachnoid hemorrhage identified. No mass effect or midline shift. Ventricles and sulci are unchanged. Cerebellum, brainstem, and basal cisterns are normal. Minimal white matter changes. No acute cortical ischemia or infarct. Vascular: No hyperdense vessel or unexpected calcification. Skull: Normal. Negative for fracture or focal lesion. Sinuses/Orbits: No acute finding. Other: None. IMPRESSION: No acute intracranial abnormalities to explain the patient's symptoms. Mild white matter changes. Electronically Signed   By: Dorise Bullion III M.D   On: 04/03/2018 17:00   Mr Brain Wo Contrast  Result Date: 04/04/2018 CLINICAL DATA:  Altered mental status and gait disturbance.  TIA EXAM: MRI HEAD WITHOUT CONTRAST TECHNIQUE: Multiplanar, multiecho pulse sequences of the brain and surrounding structures were obtained without intravenous contrast. COMPARISON:  Head CT from yesterday.  Brain MRI 10/30/2007 FINDINGS: Brain: Remarkable resolution of white matter disease seen in 2009. The patient was treated for ADEM at that time per notes. Mild cerebral volume loss. No infarct, hemorrhage, hydrocephalus, collection, or mass Vascular: Major flow voids are preserved Skull and upper cervical spine: No evidence of marrow lesion Sinuses/Orbits: Negative Other: Tornwaldt or retention cyst in the midline nasopharynx, also seen in 2009. IMPRESSION: No acute finding.  Negative for infarct. Electronically Signed   By: Monte Fantasia M.D.   On: 04/04/2018 11:08   US Carotid Bilateral (at [facility]  Only)  Result Date: 04/04/2018 CLINICAL DATA:  Ataxia.  Altered mental status EXAM: BILATERAL CAROTID DUPLEX ULTRASOUND TECHNIQUE: Pearline Cables scale imaging, color Doppler and duplex ultrasound were performed of bilateral carotid and vertebral arteries in the neck. COMPARISON:  None. FINDINGS: Criteria: Quantification of carotid stenosis is based on velocity parameters that correlate the residual internal carotid diameter with NASCET-based stenosis levels, using the diameter of the distal internal carotid lumen as the denominator for stenosis measurement. The following velocity measurements were obtained: RIGHT ICA:  93 cm/sec CCA:  671 cm/sec SYSTOLIC ICA/CCA RATIO:  0.9 DIASTOLIC ICA/CCA RATIO: ECA:  93 cm/sec LEFT ICA:  80 cm/sec CCA:  96 cm/sec SYSTOLIC ICA/CCA RATIO:  0.8 DIASTOLIC ICA/CCA RATIO: ECA:  111 cm/sec RIGHT CAROTID ARTERY: There is mild focal calcified plaque in the bulb. There is also moderate irregular focal soft plaque. Low resistance internal carotid Doppler pattern is preserved. RIGHT VERTEBRAL ARTERY:  Antegrade. LEFT CAROTID ARTERY: Mild intimal thickening in the bulb. Low resistance internal carotid Doppler pattern is preserved. LEFT VERTEBRAL ARTERY:  Antegrade. IMPRESSION: Less than 50% stenosis in the right and left internal carotid arteries. Electronically Signed   By: Marybelle Killings M.D.   On: 04/04/2018 12:07  Scheduled Meds: . albuterol  2 mg Oral QID  . ARIPiprazole  2 mg Oral QPM  . aspirin  300 mg Rectal Daily   Or  . aspirin  325 mg Oral Daily  . ciprofloxacin  500 mg Oral BID  . citalopram  40 mg Oral Daily  . gabapentin  400 mg Oral TID  . levothyroxine  25 mcg Oral QAC breakfast  . omega-3 acid ethyl esters  1 g Oral Daily  . pravastatin  80 mg Oral QPM   Continuous Infusions:  Principal Problem:   Confusion Active Problems:   Anxiety state    Time spent: >35 minutes     Kinnie Feil  [facility] Pager [phone]. If 7PM-7AM, please contact  night-coverage at [email or web address], password [facility] 04/04/2018, 12:12 PM  LOS: 0 days

## 2018-04-04 NOTE — Progress Notes (Signed)
SLP Cancellation Note  Patient Details Name: [name] MRN: [id number] DOB: [birth year]   Cancelled treatment:       Reason Eval/Treat Not Completed: SLP screened, no needs identified, will sign off; SLP screened Pt in room. Pt denies any changes in swallowing, speech, language, or cognition. MRI negative for acute changes. SLE will be deferred at this time. Reconsult if indicated. SLP will sign off.   Thank you,  Genene Churn, [place]  [place] 04/04/2018, 2:06 PM

## 2018-04-04 NOTE — Discharge Summary (Signed)
Physician Discharge Summary  [name] HBZ:[id number] DOB: 1955-10-16 DOA: 04/03/2018  PCP: Redmond School, MD  Admit date: 04/03/2018 Discharge date: 04/04/2018  Time spent: >35 minutes  Recommendations for Outpatient Follow-up:  F/u with PCP 3-7 days, with echo results  Discharge Diagnoses:  Principal Problem:   Confusion Active Problems:   Anxiety state   Discharge Condition: stable   Diet recommendation: low sodium   Filed Weights   04/03/18 1527 04/03/18 1532 04/03/18 2023  Weight: 84.8 kg (187 lb) 84.8 kg (187 lb) 83.2 kg (183 lb 6.8 oz)    History of present illness:   62 y.o.femalewith medical history significant ofAnxiety, depression, hypothyroidisms, asthma, frequent falls, migraines brought in by family because of change in her gait.  Patient seems a little altered to me right now but her husband is present and says this is normal for her. She does not recall anything that happened earlier. She denies any focal numbness tingling or weakness anywhere. Husband reports she has chronic problems with her knees and has difficulty walking normally. Today it seemed more worse than normal. Patient does have a history of chronic migraines but they are usually not complex and have any other focal neurological deficits with it. She has been having migraines but none right now. Patient is being referred for admission for possible TIA. Telemetry neurology was consulted who also recommended a full neurological work-up.   Hospital Course:   Confusion, reported gait instability prior to admission. Resolved. Possible benzo/opioid effect vs questionable TIA. Neuro exam is non focal. CT/MRI brain: no acute infarcts. hdl-28, ldl-79. Carotid: lees than 50% BL. Pend echo. Started aspirin, statin, tolerating well. D/w patient, her family at the bedside. She is wiling to f/u echo results with her PCP next week   UTI. on cipro. Afebrile. Stable   Hypothyroidism. Cont  levothyroxine.   Anxiety, depression. D/w patient/her family. I have recommended to decrease opioids, benzo use. She wants to cont home regimen, but will follow up with PCP  Asthma. Stable. No s/s of acute exacerbation. Resume home regimen     Procedures:  Echo-pend (i.e. Studies not automatically included, echos, thoracentesis, etc; not x-rays)  Consultations:  noen  Discharge Exam: Vitals:   04/04/18 0823 04/04/18 1223  BP: 115/69 (!) 146/55  Pulse: 88 99  Resp: 18 18  Temp: 98.3 F (36.8 C) 98.5 F (36.9 C)  SpO2: 100% 100%    General: no distress  Cardiovascular: s1,s2 rrr Respiratory: CTA BL  Discharge Instructions  Discharge Instructions    Diet - low sodium heart healthy   Complete by:  As directed    Discharge instructions   Complete by:  As directed    Please follow up with primary care doctor next week with echocardiogram results   Increase activity slowly   Complete by:  As directed      Allergies as of 04/04/2018      Reactions   Acetaminophen-codeine Other (See Comments)   Made patient talk out of her head and have slurred speech.   Diclofenac Potassium Other (See Comments)   cramps   Penicillins    REACTION: unknown reaction   Sulfonamide Derivatives    REACTION: unknown reacton   Tetracycline    REACTION: unknown reaction      Medication List    TAKE these medications   albuterol 108 (90 Base) MCG/ACT inhaler Commonly known as:  PROVENTIL HFA;VENTOLIN HFA Inhale 1-2 puffs into the lungs every 6 (six) hours as needed for wheezing  or shortness of breath.   albuterol 2 MG tablet Commonly known as:  PROVENTIL Take 2 mg by mouth 4 (four) times daily.   ALPRAZolam 1 MG tablet Commonly known as:  XANAX Take 1 tablet by mouth 4 (four) times daily as needed for anxiety.   ARIPiprazole 2 MG tablet Commonly known as:  ABILIFY Take 2 mg by mouth every evening.   aspirin EC 81 MG tablet Take 1 tablet (81 mg total) by mouth daily.    ciprofloxacin 500 MG tablet Commonly known as:  CIPRO Take 500 mg by mouth 2 (two) times daily. for 7 days   citalopram 40 MG tablet Commonly known as:  CELEXA Take 1 tablet by mouth daily.   cyclobenzaprine 10 MG tablet Commonly known as:  FLEXERIL Take 10 mg by mouth 3 (three) times daily as needed for muscle spasms.   docusate sodium 100 MG capsule Commonly known as:  COLACE Take 100 mg by mouth daily as needed for mild constipation.   furosemide 20 MG tablet Commonly known as:  LASIX Take 1 tablet by mouth daily as needed for fluid or edema.   gabapentin 400 MG capsule Commonly known as:  NEURONTIN Take 1 capsule by mouth 3 (three) times daily.   HYDROcodone-acetaminophen 5-325 MG tablet Commonly known as:  NORCO/VICODIN Take 2 tablets by mouth every 4 (four) hours as needed. What changed:    how much to take  when to take this  reasons to take this   Krill Oil 300 MG Caps Take 1 capsule by mouth daily.   levothyroxine 25 MCG tablet Commonly known as:  SYNTHROID, LEVOTHROID Take 25 mcg by mouth daily before breakfast.   meclizine 25 MG tablet Commonly known as:  ANTIVERT Take 1 tablet (25 mg total) by mouth 4 (four) times daily. What changed:  when to take this   omeprazole 40 MG capsule Commonly known as:  PRILOSEC Take 1 capsule by mouth daily.   ondansetron 8 MG tablet Commonly known as:  ZOFRAN Take 1 tablet (8 mg total) by mouth every 4 (four) hours as needed.   pravastatin 80 MG tablet Commonly known as:  PRAVACHOL Take 1 tablet by mouth every evening.   zolpidem 5 MG tablet Commonly known as:  AMBIEN Take 5 mg by mouth at bedtime.      Allergies  Allergen Reactions  . Acetaminophen-Codeine Other (See Comments)    Made patient talk out of her head and have slurred speech.  . Diclofenac Potassium Other (See Comments)    cramps  . Penicillins     REACTION: unknown reaction  . Sulfonamide Derivatives     REACTION: unknown reacton  .  Tetracycline     REACTION: unknown reaction   Follow-up Information    [facility] Follow up.   Why:  home health physical therapy Contact information: [address] [place] [phone]           The results of significant diagnostics from this hospitalization (including imaging, microbiology, ancillary and laboratory) are listed below for reference.    Significant Diagnostic Studies: Dg Chest 2 View  Result Date: 04/03/2018 CLINICAL DATA:  Altered mental status and difficulty walking. EXAM: CHEST - 2 VIEW COMPARISON:  11/11/2016 FINDINGS: The heart size and mediastinal contours are within normal limits. Low bilateral lung volumes. There is no evidence of pulmonary edema, consolidation, pneumothorax, nodule or pleural fluid. The visualized skeletal structures are unremarkable. IMPRESSION: No active cardiopulmonary disease. Electronically  Signed   By: Aletta Edouard M.D.   On: 04/03/2018 16:47   Ct Head Wo Contrast  Result Date: 04/03/2018 CLINICAL DATA:  Patient with altered mental status. Difficulty walking. Patient normal this morning. Weakness. EXAM: CT HEAD WITHOUT CONTRAST TECHNIQUE: Contiguous axial images were obtained from the base of the skull through the vertex without intravenous contrast. COMPARISON:  None. FINDINGS: Brain: No subdural, epidural, or subarachnoid hemorrhage identified. No mass effect or midline shift. Ventricles and sulci are unchanged. Cerebellum, brainstem, and basal cisterns are normal. Minimal white matter changes. No acute cortical ischemia or infarct. Vascular: No hyperdense vessel or unexpected calcification. Skull: Normal. Negative for fracture or focal lesion. Sinuses/Orbits: No acute finding. Other: None. IMPRESSION: No acute intracranial abnormalities to explain the patient's symptoms. Mild white matter changes. Electronically Signed   By: Dorise Bullion III M.D   On: 04/03/2018 17:00   Mr Brain Wo  Contrast  Result Date: 04/04/2018 CLINICAL DATA:  Altered mental status and gait disturbance.  TIA EXAM: MRI HEAD WITHOUT CONTRAST TECHNIQUE: Multiplanar, multiecho pulse sequences of the brain and surrounding structures were obtained without intravenous contrast. COMPARISON:  Head CT from yesterday.  Brain MRI 10/30/2007 FINDINGS: Brain: Remarkable resolution of white matter disease seen in 2009. The patient was treated for ADEM at that time per notes. Mild cerebral volume loss. No infarct, hemorrhage, hydrocephalus, collection, or mass Vascular: Major flow voids are preserved Skull and upper cervical spine: No evidence of marrow lesion Sinuses/Orbits: Negative Other: Tornwaldt or retention cyst in the midline nasopharynx, also seen in 2009. IMPRESSION: No acute finding.  Negative for infarct. Electronically Signed   By: Monte Fantasia M.D.   On: 04/04/2018 11:08   US Carotid Bilateral (at [facility] Only)  Result Date: 04/04/2018 CLINICAL DATA:  Ataxia.  Altered mental status EXAM: BILATERAL CAROTID DUPLEX ULTRASOUND TECHNIQUE: Pearline Cables scale imaging, color Doppler and duplex ultrasound were performed of bilateral carotid and vertebral arteries in the neck. COMPARISON:  None. FINDINGS: Criteria: Quantification of carotid stenosis is based on velocity parameters that correlate the residual internal carotid diameter with NASCET-based stenosis levels, using the diameter of the distal internal carotid lumen as the denominator for stenosis measurement. The following velocity measurements were obtained: RIGHT ICA:  93 cm/sec CCA:  601 cm/sec SYSTOLIC ICA/CCA RATIO:  0.9 DIASTOLIC ICA/CCA RATIO: ECA:  93 cm/sec LEFT ICA:  80 cm/sec CCA:  96 cm/sec SYSTOLIC ICA/CCA RATIO:  0.8 DIASTOLIC ICA/CCA RATIO: ECA:  111 cm/sec RIGHT CAROTID ARTERY: There is mild focal calcified plaque in the bulb. There is also moderate irregular focal soft plaque. Low resistance internal carotid Doppler pattern is preserved. RIGHT VERTEBRAL  ARTERY:  Antegrade. LEFT CAROTID ARTERY: Mild intimal thickening in the bulb. Low resistance internal carotid Doppler pattern is preserved. LEFT VERTEBRAL ARTERY:  Antegrade. IMPRESSION: Less than 50% stenosis in the right and left internal carotid arteries. Electronically Signed   By: Marybelle Killings M.D.   On: 04/04/2018 12:07    Microbiology: No results found for this or any previous visit (from the past 240 hour(s)).   Labs: Basic Metabolic Panel: Recent Labs  Lab 04/03/18 1610  NA 142  K 3.7  CL 105  CO2 28  GLUCOSE 127*  BUN 8  CREATININE 0.63  CALCIUM 9.7   Liver Function Tests: Recent Labs  Lab 04/03/18 1610  AST 27  ALT 16  ALKPHOS 99  BILITOT 0.8  PROT 8.6*  ALBUMIN 4.0   No results for input(s): LIPASE, AMYLASE in  the last 168 hours. No results for input(s): AMMONIA in the last 168 hours. CBC: Recent Labs  Lab 04/03/18 1610  WBC 11.2*  NEUTROABS 8.4*  HGB 12.5  HCT 39.8  MCV 85.4  PLT 299   Cardiac Enzymes: Recent Labs  Lab 04/03/18 1610  TROPONINI <0.03   BNP: BNP (last 3 results) No results for input(s): BNP in the last 8760 hours.  ProBNP (last 3 results) No results for input(s): PROBNP in the last 8760 hours.  CBG: Recent Labs  Lab 04/03/18 1534  GLUCAP 123*       Signed:  Franci Oshana N  [facility] 04/04/2018, 3:42 PM

## 2018-04-04 NOTE — Care Management Note (Addendum)
Case Management Note  Patient Details  Name: SHATONYA PASSON MRN: [id number] Date of Birth: Jul 24, 1956  Subjective/Objective:   Confusion. From home with husband. Independent with ADL's. Has RW. Has PCP, transportation, and insurance with prescription coverage.                 Action/Plan: Recommended for HH PT. [facility] like [facility]. Jermaine of [facility] notified and will obtain orders via Epic.  Expected Discharge Date:   04/04/2018               Expected Discharge Plan:  [place]  In-House Referral:     Discharge planning Services  CM Consult  Post Acute Care Choice:  Home Health Choice offered to:  Patient  DME Arranged:    DME Agency:     HH Arranged:  PT [place]:  [place]  Status of Service:  Completed, signed off  If discussed at [place] of Stay Meetings, dates discussed:    Additional Comments:  Hollie Bartus, Chauncey [place], RN 04/04/2018, 12:19 PM

## 2018-04-04 NOTE — Progress Notes (Signed)
*  PRELIMINARY RESULTS* Echocardiogram 2D Echocardiogram has been performed.  [name] 04/04/2018, 2:15 PM

## 2018-04-06 DIAGNOSIS — G43709 Chronic migraine without aura, not intractable, without status migrainosus: Secondary | ICD-10-CM | POA: Diagnosis not present

## 2018-04-06 DIAGNOSIS — F419 Anxiety disorder, unspecified: Secondary | ICD-10-CM | POA: Diagnosis not present

## 2018-04-06 DIAGNOSIS — Z8673 Personal history of transient ischemic attack (TIA), and cerebral infarction without residual deficits: Secondary | ICD-10-CM | POA: Diagnosis not present

## 2018-04-06 DIAGNOSIS — Z9181 History of falling: Secondary | ICD-10-CM | POA: Diagnosis not present

## 2018-04-06 DIAGNOSIS — M170 Bilateral primary osteoarthritis of knee: Secondary | ICD-10-CM | POA: Diagnosis not present

## 2018-04-06 DIAGNOSIS — R2689 Other abnormalities of gait and mobility: Secondary | ICD-10-CM | POA: Diagnosis not present

## 2018-04-06 DIAGNOSIS — J45909 Unspecified asthma, uncomplicated: Secondary | ICD-10-CM | POA: Diagnosis not present

## 2018-04-06 DIAGNOSIS — K219 Gastro-esophageal reflux disease without esophagitis: Secondary | ICD-10-CM | POA: Diagnosis not present

## 2018-04-06 DIAGNOSIS — E039 Hypothyroidism, unspecified: Secondary | ICD-10-CM | POA: Diagnosis not present

## 2018-04-06 DIAGNOSIS — N390 Urinary tract infection, site not specified: Secondary | ICD-10-CM | POA: Diagnosis not present

## 2018-04-06 DIAGNOSIS — K760 Fatty (change of) liver, not elsewhere classified: Secondary | ICD-10-CM | POA: Diagnosis not present

## 2018-04-06 DIAGNOSIS — F329 Major depressive disorder, single episode, unspecified: Secondary | ICD-10-CM | POA: Diagnosis not present

## 2018-04-06 DIAGNOSIS — Z79899 Other long term (current) drug therapy: Secondary | ICD-10-CM | POA: Diagnosis not present

## 2018-04-10 ENCOUNTER — Encounter: Payer: Self-pay | Admitting: Orthopaedic Surgery

## 2018-04-10 ENCOUNTER — Ambulatory Visit (INDEPENDENT_AMBULATORY_CARE_PROVIDER_SITE_OTHER): Payer: Medicare Other | Admitting: Orthopaedic Surgery

## 2018-04-10 VITALS — BP 140/85 | HR 91 | Ht 66.0 in | Wt 180.0 lb

## 2018-04-10 DIAGNOSIS — G8929 Other chronic pain: Secondary | ICD-10-CM | POA: Diagnosis not present

## 2018-04-10 DIAGNOSIS — M25562 Pain in left knee: Secondary | ICD-10-CM | POA: Diagnosis not present

## 2018-04-10 NOTE — Progress Notes (Signed)
CC:  I have pain of my left knee. I would like an injection.  The patient has chronic pain of the left knee.  There is no recent trauma.  There is no redness.  Injections in the past have helped.  The knee has no redness, has an effusion and crepitus present.  ROM of the left knee is 0-95.  Impression:  Chronic knee pain left  Return: prn  PROCEDURE NOTE:  The patient requests injections of the left knee, verbal consent was obtained.  The left knee was prepped appropriately after time out was performed.   Sterile technique was observed and injection of 1 cc of Depo-Medrol 40 mg with several cc's of plain xylocaine. Anesthesia was provided by ethyl chloride and a 20-gauge needle was used to inject the knee area. The injection was tolerated well.  A band aid dressing was applied.  The patient was advised to apply ice later today and tomorrow to the injection sight as needed.  Electronically Signed Sanjuana Kava, MD 8/6/20193:17 PM

## 2018-04-11 DIAGNOSIS — G894 Chronic pain syndrome: Secondary | ICD-10-CM | POA: Diagnosis not present

## 2018-04-11 DIAGNOSIS — Z8673 Personal history of transient ischemic attack (TIA), and cerebral infarction without residual deficits: Secondary | ICD-10-CM | POA: Diagnosis not present

## 2018-04-11 DIAGNOSIS — R2681 Unsteadiness on feet: Secondary | ICD-10-CM | POA: Diagnosis not present

## 2018-04-11 DIAGNOSIS — N390 Urinary tract infection, site not specified: Secondary | ICD-10-CM | POA: Diagnosis not present

## 2018-04-11 DIAGNOSIS — G43709 Chronic migraine without aura, not intractable, without status migrainosus: Secondary | ICD-10-CM | POA: Diagnosis not present

## 2018-04-11 DIAGNOSIS — J45909 Unspecified asthma, uncomplicated: Secondary | ICD-10-CM | POA: Diagnosis not present

## 2018-04-11 DIAGNOSIS — Z6829 Body mass index (BMI) 29.0-29.9, adult: Secondary | ICD-10-CM | POA: Diagnosis not present

## 2018-04-11 DIAGNOSIS — M170 Bilateral primary osteoarthritis of knee: Secondary | ICD-10-CM | POA: Diagnosis not present

## 2018-04-11 DIAGNOSIS — R410 Disorientation, unspecified: Secondary | ICD-10-CM | POA: Diagnosis not present

## 2018-04-11 DIAGNOSIS — R2689 Other abnormalities of gait and mobility: Secondary | ICD-10-CM | POA: Diagnosis not present

## 2018-04-13 DIAGNOSIS — M170 Bilateral primary osteoarthritis of knee: Secondary | ICD-10-CM | POA: Diagnosis not present

## 2018-04-13 DIAGNOSIS — R2689 Other abnormalities of gait and mobility: Secondary | ICD-10-CM | POA: Diagnosis not present

## 2018-04-13 DIAGNOSIS — Z8673 Personal history of transient ischemic attack (TIA), and cerebral infarction without residual deficits: Secondary | ICD-10-CM | POA: Diagnosis not present

## 2018-04-13 DIAGNOSIS — N390 Urinary tract infection, site not specified: Secondary | ICD-10-CM | POA: Diagnosis not present

## 2018-04-13 DIAGNOSIS — J45909 Unspecified asthma, uncomplicated: Secondary | ICD-10-CM | POA: Diagnosis not present

## 2018-04-13 DIAGNOSIS — G43709 Chronic migraine without aura, not intractable, without status migrainosus: Secondary | ICD-10-CM | POA: Diagnosis not present

## 2018-04-16 DIAGNOSIS — J45909 Unspecified asthma, uncomplicated: Secondary | ICD-10-CM | POA: Diagnosis not present

## 2018-04-16 DIAGNOSIS — G43709 Chronic migraine without aura, not intractable, without status migrainosus: Secondary | ICD-10-CM | POA: Diagnosis not present

## 2018-04-16 DIAGNOSIS — N390 Urinary tract infection, site not specified: Secondary | ICD-10-CM | POA: Diagnosis not present

## 2018-04-16 DIAGNOSIS — M170 Bilateral primary osteoarthritis of knee: Secondary | ICD-10-CM | POA: Diagnosis not present

## 2018-04-16 DIAGNOSIS — Z8673 Personal history of transient ischemic attack (TIA), and cerebral infarction without residual deficits: Secondary | ICD-10-CM | POA: Diagnosis not present

## 2018-04-16 DIAGNOSIS — R2689 Other abnormalities of gait and mobility: Secondary | ICD-10-CM | POA: Diagnosis not present

## 2018-04-17 DIAGNOSIS — Z8673 Personal history of transient ischemic attack (TIA), and cerebral infarction without residual deficits: Secondary | ICD-10-CM | POA: Diagnosis not present

## 2018-04-17 DIAGNOSIS — R2689 Other abnormalities of gait and mobility: Secondary | ICD-10-CM | POA: Diagnosis not present

## 2018-04-17 DIAGNOSIS — J45909 Unspecified asthma, uncomplicated: Secondary | ICD-10-CM | POA: Diagnosis not present

## 2018-04-17 DIAGNOSIS — G43709 Chronic migraine without aura, not intractable, without status migrainosus: Secondary | ICD-10-CM | POA: Diagnosis not present

## 2018-04-17 DIAGNOSIS — N390 Urinary tract infection, site not specified: Secondary | ICD-10-CM | POA: Diagnosis not present

## 2018-04-17 DIAGNOSIS — M170 Bilateral primary osteoarthritis of knee: Secondary | ICD-10-CM | POA: Diagnosis not present

## 2018-04-19 DIAGNOSIS — N390 Urinary tract infection, site not specified: Secondary | ICD-10-CM | POA: Diagnosis not present

## 2018-04-19 DIAGNOSIS — M170 Bilateral primary osteoarthritis of knee: Secondary | ICD-10-CM | POA: Diagnosis not present

## 2018-04-19 DIAGNOSIS — R2689 Other abnormalities of gait and mobility: Secondary | ICD-10-CM | POA: Diagnosis not present

## 2018-04-19 DIAGNOSIS — J45909 Unspecified asthma, uncomplicated: Secondary | ICD-10-CM | POA: Diagnosis not present

## 2018-04-19 DIAGNOSIS — Z8673 Personal history of transient ischemic attack (TIA), and cerebral infarction without residual deficits: Secondary | ICD-10-CM | POA: Diagnosis not present

## 2018-04-19 DIAGNOSIS — G43709 Chronic migraine without aura, not intractable, without status migrainosus: Secondary | ICD-10-CM | POA: Diagnosis not present

## 2018-04-23 DIAGNOSIS — M170 Bilateral primary osteoarthritis of knee: Secondary | ICD-10-CM | POA: Diagnosis not present

## 2018-04-23 DIAGNOSIS — R2689 Other abnormalities of gait and mobility: Secondary | ICD-10-CM | POA: Diagnosis not present

## 2018-04-23 DIAGNOSIS — Z8673 Personal history of transient ischemic attack (TIA), and cerebral infarction without residual deficits: Secondary | ICD-10-CM | POA: Diagnosis not present

## 2018-04-23 DIAGNOSIS — G43709 Chronic migraine without aura, not intractable, without status migrainosus: Secondary | ICD-10-CM | POA: Diagnosis not present

## 2018-04-23 DIAGNOSIS — N390 Urinary tract infection, site not specified: Secondary | ICD-10-CM | POA: Diagnosis not present

## 2018-04-23 DIAGNOSIS — J45909 Unspecified asthma, uncomplicated: Secondary | ICD-10-CM | POA: Diagnosis not present

## 2018-04-26 DIAGNOSIS — J45909 Unspecified asthma, uncomplicated: Secondary | ICD-10-CM | POA: Diagnosis not present

## 2018-04-26 DIAGNOSIS — G43709 Chronic migraine without aura, not intractable, without status migrainosus: Secondary | ICD-10-CM | POA: Diagnosis not present

## 2018-04-26 DIAGNOSIS — N390 Urinary tract infection, site not specified: Secondary | ICD-10-CM | POA: Diagnosis not present

## 2018-04-26 DIAGNOSIS — M170 Bilateral primary osteoarthritis of knee: Secondary | ICD-10-CM | POA: Diagnosis not present

## 2018-04-26 DIAGNOSIS — Z8673 Personal history of transient ischemic attack (TIA), and cerebral infarction without residual deficits: Secondary | ICD-10-CM | POA: Diagnosis not present

## 2018-04-26 DIAGNOSIS — R2689 Other abnormalities of gait and mobility: Secondary | ICD-10-CM | POA: Diagnosis not present

## 2018-05-03 DIAGNOSIS — E6609 Other obesity due to excess calories: Secondary | ICD-10-CM | POA: Diagnosis not present

## 2018-05-03 DIAGNOSIS — Z6831 Body mass index (BMI) 31.0-31.9, adult: Secondary | ICD-10-CM | POA: Diagnosis not present

## 2018-05-03 DIAGNOSIS — N342 Other urethritis: Secondary | ICD-10-CM | POA: Diagnosis not present

## 2018-05-03 DIAGNOSIS — J069 Acute upper respiratory infection, unspecified: Secondary | ICD-10-CM | POA: Diagnosis not present

## 2018-05-03 DIAGNOSIS — N390 Urinary tract infection, site not specified: Secondary | ICD-10-CM | POA: Diagnosis not present

## 2018-05-23 DIAGNOSIS — Z6830 Body mass index (BMI) 30.0-30.9, adult: Secondary | ICD-10-CM | POA: Diagnosis not present

## 2018-05-23 DIAGNOSIS — Z23 Encounter for immunization: Secondary | ICD-10-CM | POA: Diagnosis not present

## 2018-05-23 DIAGNOSIS — G894 Chronic pain syndrome: Secondary | ICD-10-CM | POA: Diagnosis not present

## 2018-05-23 DIAGNOSIS — E6609 Other obesity due to excess calories: Secondary | ICD-10-CM | POA: Diagnosis not present

## 2018-05-23 DIAGNOSIS — F419 Anxiety disorder, unspecified: Secondary | ICD-10-CM | POA: Diagnosis not present

## 2018-06-26 DIAGNOSIS — N342 Other urethritis: Secondary | ICD-10-CM | POA: Diagnosis not present

## 2018-06-26 DIAGNOSIS — R350 Frequency of micturition: Secondary | ICD-10-CM | POA: Diagnosis not present

## 2018-06-26 DIAGNOSIS — Z6830 Body mass index (BMI) 30.0-30.9, adult: Secondary | ICD-10-CM | POA: Diagnosis not present

## 2018-06-26 DIAGNOSIS — R300 Dysuria: Secondary | ICD-10-CM | POA: Diagnosis not present

## 2018-07-04 ENCOUNTER — Ambulatory Visit: Payer: Medicare Other | Admitting: Orthopedic Surgery

## 2018-08-21 ENCOUNTER — Ambulatory Visit (INDEPENDENT_AMBULATORY_CARE_PROVIDER_SITE_OTHER): Payer: Medicare Other | Admitting: Orthopaedic Surgery

## 2018-08-21 ENCOUNTER — Encounter: Payer: Self-pay | Admitting: Orthopaedic Surgery

## 2018-08-21 DIAGNOSIS — M1712 Unilateral primary osteoarthritis, left knee: Secondary | ICD-10-CM

## 2018-08-21 DIAGNOSIS — G8929 Other chronic pain: Secondary | ICD-10-CM | POA: Diagnosis not present

## 2018-08-21 DIAGNOSIS — M25562 Pain in left knee: Secondary | ICD-10-CM | POA: Diagnosis not present

## 2018-08-21 NOTE — Progress Notes (Signed)
CC:  I have pain of my left knee. I would like an injection.  The patient has chronic pain of the left knee.  There is no recent trauma.  There is no redness.  Injections in the past have helped.  The knee has no redness, has an effusion and crepitus present.  ROM of the left knee is 0-100.  Impression:  Chronic knee pain left  Return: as needed  PROCEDURE NOTE:  The patient requests injections of the left knee, verbal consent was obtained.  The left knee was prepped appropriately after time out was performed.   Sterile technique was observed and injection of 1 cc of Depo-Medrol 40 mg with several cc's of plain xylocaine. Anesthesia was provided by ethyl chloride and a 20-gauge needle was used to inject the knee area. The injection was tolerated well.  A band aid dressing was applied.  The patient was advised to apply ice later today and tomorrow to the injection sight as needed.  Electronically Signed Sanjuana Kava, MD 12/17/20192:37 PM

## 2018-09-06 DIAGNOSIS — E6609 Other obesity due to excess calories: Secondary | ICD-10-CM | POA: Diagnosis not present

## 2018-09-06 DIAGNOSIS — F419 Anxiety disorder, unspecified: Secondary | ICD-10-CM | POA: Diagnosis not present

## 2018-09-06 DIAGNOSIS — Z6831 Body mass index (BMI) 31.0-31.9, adult: Secondary | ICD-10-CM | POA: Diagnosis not present

## 2018-09-06 DIAGNOSIS — G894 Chronic pain syndrome: Secondary | ICD-10-CM | POA: Diagnosis not present

## 2018-09-06 DIAGNOSIS — Z1389 Encounter for screening for other disorder: Secondary | ICD-10-CM | POA: Diagnosis not present

## 2018-10-23 DIAGNOSIS — D72829 Elevated white blood cell count, unspecified: Secondary | ICD-10-CM | POA: Diagnosis not present

## 2018-10-23 DIAGNOSIS — E119 Type 2 diabetes mellitus without complications: Secondary | ICD-10-CM | POA: Diagnosis not present

## 2018-10-23 DIAGNOSIS — R5383 Other fatigue: Secondary | ICD-10-CM | POA: Diagnosis not present

## 2018-10-23 DIAGNOSIS — E039 Hypothyroidism, unspecified: Secondary | ICD-10-CM | POA: Diagnosis not present

## 2018-10-23 DIAGNOSIS — E559 Vitamin D deficiency, unspecified: Secondary | ICD-10-CM | POA: Diagnosis not present

## 2018-10-23 DIAGNOSIS — M1991 Primary osteoarthritis, unspecified site: Secondary | ICD-10-CM | POA: Diagnosis not present

## 2018-10-23 DIAGNOSIS — E538 Deficiency of other specified B group vitamins: Secondary | ICD-10-CM | POA: Diagnosis not present

## 2018-10-23 DIAGNOSIS — Z6830 Body mass index (BMI) 30.0-30.9, adult: Secondary | ICD-10-CM | POA: Diagnosis not present

## 2018-11-23 DIAGNOSIS — E538 Deficiency of other specified B group vitamins: Secondary | ICD-10-CM | POA: Diagnosis not present

## 2018-11-23 DIAGNOSIS — R251 Tremor, unspecified: Secondary | ICD-10-CM | POA: Diagnosis not present

## 2018-11-23 DIAGNOSIS — E063 Autoimmune thyroiditis: Secondary | ICD-10-CM | POA: Diagnosis not present

## 2018-11-23 DIAGNOSIS — Z6830 Body mass index (BMI) 30.0-30.9, adult: Secondary | ICD-10-CM | POA: Diagnosis not present

## 2018-11-23 DIAGNOSIS — G894 Chronic pain syndrome: Secondary | ICD-10-CM | POA: Diagnosis not present

## 2018-11-23 DIAGNOSIS — R2681 Unsteadiness on feet: Secondary | ICD-10-CM | POA: Diagnosis not present

## 2018-12-07 DIAGNOSIS — R251 Tremor, unspecified: Secondary | ICD-10-CM | POA: Diagnosis not present

## 2018-12-07 DIAGNOSIS — R2689 Other abnormalities of gait and mobility: Secondary | ICD-10-CM | POA: Diagnosis not present

## 2018-12-07 DIAGNOSIS — F331 Major depressive disorder, recurrent, moderate: Secondary | ICD-10-CM | POA: Diagnosis not present

## 2018-12-07 DIAGNOSIS — G2111 Neuroleptic induced parkinsonism: Secondary | ICD-10-CM | POA: Diagnosis not present

## 2018-12-25 DIAGNOSIS — E7849 Other hyperlipidemia: Secondary | ICD-10-CM | POA: Diagnosis not present

## 2018-12-25 DIAGNOSIS — E6609 Other obesity due to excess calories: Secondary | ICD-10-CM | POA: Diagnosis not present

## 2018-12-25 DIAGNOSIS — F419 Anxiety disorder, unspecified: Secondary | ICD-10-CM | POA: Diagnosis not present

## 2018-12-25 DIAGNOSIS — Z1389 Encounter for screening for other disorder: Secondary | ICD-10-CM | POA: Diagnosis not present

## 2018-12-25 DIAGNOSIS — Z6830 Body mass index (BMI) 30.0-30.9, adult: Secondary | ICD-10-CM | POA: Diagnosis not present

## 2019-01-22 ENCOUNTER — Other Ambulatory Visit (HOSPITAL_COMMUNITY): Payer: Self-pay | Admitting: Internal Medicine

## 2019-01-22 DIAGNOSIS — Z1231 Encounter for screening mammogram for malignant neoplasm of breast: Secondary | ICD-10-CM

## 2019-01-29 DIAGNOSIS — R251 Tremor, unspecified: Secondary | ICD-10-CM | POA: Diagnosis not present

## 2019-01-29 DIAGNOSIS — Z79899 Other long term (current) drug therapy: Secondary | ICD-10-CM | POA: Diagnosis not present

## 2019-01-29 DIAGNOSIS — R2689 Other abnormalities of gait and mobility: Secondary | ICD-10-CM | POA: Diagnosis not present

## 2019-01-29 DIAGNOSIS — F331 Major depressive disorder, recurrent, moderate: Secondary | ICD-10-CM | POA: Diagnosis not present

## 2019-02-20 ENCOUNTER — Ambulatory Visit (HOSPITAL_COMMUNITY): Payer: Medicare Other

## 2019-03-20 ENCOUNTER — Other Ambulatory Visit: Payer: Self-pay

## 2019-03-20 ENCOUNTER — Ambulatory Visit (HOSPITAL_COMMUNITY)
Admission: RE | Admit: 2019-03-20 | Discharge: 2019-03-20 | Disposition: A | Discharge status: Home / Self Care | Payer: Medicare Other | Source: Ambulatory Visit | Attending: Internal Medicine | Admitting: Internal Medicine

## 2019-03-20 DIAGNOSIS — R251 Tremor, unspecified: Secondary | ICD-10-CM | POA: Diagnosis not present

## 2019-03-20 DIAGNOSIS — Z79899 Other long term (current) drug therapy: Secondary | ICD-10-CM | POA: Diagnosis not present

## 2019-03-20 DIAGNOSIS — Z1231 Encounter for screening mammogram for malignant neoplasm of breast: Secondary | ICD-10-CM | POA: Insufficient documentation

## 2019-03-20 DIAGNOSIS — F331 Major depressive disorder, recurrent, moderate: Secondary | ICD-10-CM | POA: Diagnosis not present

## 2019-03-20 DIAGNOSIS — R2689 Other abnormalities of gait and mobility: Secondary | ICD-10-CM | POA: Diagnosis not present

## 2019-03-21 DIAGNOSIS — E7849 Other hyperlipidemia: Secondary | ICD-10-CM | POA: Diagnosis not present

## 2019-03-21 DIAGNOSIS — Z6830 Body mass index (BMI) 30.0-30.9, adult: Secondary | ICD-10-CM | POA: Diagnosis not present

## 2019-03-21 DIAGNOSIS — Z0001 Encounter for general adult medical examination with abnormal findings: Secondary | ICD-10-CM | POA: Diagnosis not present

## 2019-03-21 DIAGNOSIS — Z1389 Encounter for screening for other disorder: Secondary | ICD-10-CM | POA: Diagnosis not present

## 2019-03-21 DIAGNOSIS — G894 Chronic pain syndrome: Secondary | ICD-10-CM | POA: Diagnosis not present

## 2019-03-22 ENCOUNTER — Ambulatory Visit (HOSPITAL_COMMUNITY): Payer: Medicare Other

## 2019-03-27 DIAGNOSIS — Z1211 Encounter for screening for malignant neoplasm of colon: Secondary | ICD-10-CM | POA: Diagnosis not present

## 2019-04-10 DIAGNOSIS — Z6831 Body mass index (BMI) 31.0-31.9, adult: Secondary | ICD-10-CM | POA: Diagnosis not present

## 2019-04-10 DIAGNOSIS — H6123 Impacted cerumen, bilateral: Secondary | ICD-10-CM | POA: Diagnosis not present

## 2019-04-10 DIAGNOSIS — J019 Acute sinusitis, unspecified: Secondary | ICD-10-CM | POA: Diagnosis not present

## 2019-04-10 DIAGNOSIS — E6609 Other obesity due to excess calories: Secondary | ICD-10-CM | POA: Diagnosis not present

## 2019-05-24 DIAGNOSIS — E6609 Other obesity due to excess calories: Secondary | ICD-10-CM | POA: Diagnosis not present

## 2019-05-24 DIAGNOSIS — Z6831 Body mass index (BMI) 31.0-31.9, adult: Secondary | ICD-10-CM | POA: Diagnosis not present

## 2019-05-24 DIAGNOSIS — B029 Zoster without complications: Secondary | ICD-10-CM | POA: Diagnosis not present

## 2019-07-01 DIAGNOSIS — Z23 Encounter for immunization: Secondary | ICD-10-CM | POA: Diagnosis not present

## 2019-07-16 DIAGNOSIS — F331 Major depressive disorder, recurrent, moderate: Secondary | ICD-10-CM | POA: Diagnosis not present

## 2019-07-16 DIAGNOSIS — Z79899 Other long term (current) drug therapy: Secondary | ICD-10-CM | POA: Diagnosis not present

## 2019-07-16 DIAGNOSIS — R251 Tremor, unspecified: Secondary | ICD-10-CM | POA: Diagnosis not present

## 2019-07-16 DIAGNOSIS — R2689 Other abnormalities of gait and mobility: Secondary | ICD-10-CM | POA: Diagnosis not present

## 2019-08-22 DIAGNOSIS — F419 Anxiety disorder, unspecified: Secondary | ICD-10-CM | POA: Diagnosis not present

## 2019-08-22 DIAGNOSIS — Z6830 Body mass index (BMI) 30.0-30.9, adult: Secondary | ICD-10-CM | POA: Diagnosis not present

## 2019-08-22 DIAGNOSIS — E6609 Other obesity due to excess calories: Secondary | ICD-10-CM | POA: Diagnosis not present

## 2019-08-22 DIAGNOSIS — U071 COVID-19: Secondary | ICD-10-CM | POA: Diagnosis not present

## 2019-09-09 ENCOUNTER — Other Ambulatory Visit: Payer: Self-pay | Admitting: Internal Medicine

## 2019-09-09 ENCOUNTER — Other Ambulatory Visit: Payer: Self-pay

## 2019-09-09 ENCOUNTER — Ambulatory Visit (HOSPITAL_COMMUNITY)
Admission: RE | Admit: 2019-09-09 | Discharge: 2019-09-09 | Disposition: A | Discharge status: Home / Self Care | Payer: Medicare Other | Source: Ambulatory Visit | Attending: Internal Medicine | Admitting: Internal Medicine

## 2019-09-09 DIAGNOSIS — M79605 Pain in left leg: Secondary | ICD-10-CM | POA: Diagnosis not present

## 2019-09-09 DIAGNOSIS — S8992XA Unspecified injury of left lower leg, initial encounter: Secondary | ICD-10-CM | POA: Diagnosis not present

## 2019-11-21 DIAGNOSIS — Z6831 Body mass index (BMI) 31.0-31.9, adult: Secondary | ICD-10-CM | POA: Diagnosis not present

## 2019-11-21 DIAGNOSIS — F419 Anxiety disorder, unspecified: Secondary | ICD-10-CM | POA: Diagnosis not present

## 2019-11-21 DIAGNOSIS — E6609 Other obesity due to excess calories: Secondary | ICD-10-CM | POA: Diagnosis not present

## 2019-11-21 DIAGNOSIS — G894 Chronic pain syndrome: Secondary | ICD-10-CM | POA: Diagnosis not present

## 2020-01-03 DIAGNOSIS — Z23 Encounter for immunization: Secondary | ICD-10-CM | POA: Diagnosis not present

## 2020-01-16 DIAGNOSIS — G2401 Drug induced subacute dyskinesia: Secondary | ICD-10-CM | POA: Insufficient documentation

## 2020-01-16 DIAGNOSIS — G2111 Neuroleptic induced parkinsonism: Secondary | ICD-10-CM | POA: Insufficient documentation

## 2020-01-16 DIAGNOSIS — T43505A Adverse effect of unspecified antipsychotics and neuroleptics, initial encounter: Secondary | ICD-10-CM | POA: Insufficient documentation

## 2020-01-16 DIAGNOSIS — R251 Tremor, unspecified: Secondary | ICD-10-CM | POA: Insufficient documentation

## 2020-01-16 DIAGNOSIS — F321 Major depressive disorder, single episode, moderate: Secondary | ICD-10-CM | POA: Insufficient documentation

## 2020-01-20 DIAGNOSIS — G2401 Drug induced subacute dyskinesia: Secondary | ICD-10-CM | POA: Diagnosis not present

## 2020-01-20 DIAGNOSIS — R2689 Other abnormalities of gait and mobility: Secondary | ICD-10-CM | POA: Diagnosis not present

## 2020-01-20 DIAGNOSIS — Z79899 Other long term (current) drug therapy: Secondary | ICD-10-CM | POA: Diagnosis not present

## 2020-01-20 DIAGNOSIS — G2111 Neuroleptic induced parkinsonism: Secondary | ICD-10-CM | POA: Diagnosis not present

## 2020-01-20 DIAGNOSIS — R251 Tremor, unspecified: Secondary | ICD-10-CM | POA: Diagnosis not present

## 2020-01-20 DIAGNOSIS — G20 Parkinson's disease: Secondary | ICD-10-CM | POA: Diagnosis not present

## 2020-02-06 DIAGNOSIS — S8391XA Sprain of unspecified site of right knee, initial encounter: Secondary | ICD-10-CM | POA: Diagnosis not present

## 2020-02-06 DIAGNOSIS — Z1389 Encounter for screening for other disorder: Secondary | ICD-10-CM | POA: Diagnosis not present

## 2020-02-06 DIAGNOSIS — Z6831 Body mass index (BMI) 31.0-31.9, adult: Secondary | ICD-10-CM | POA: Diagnosis not present

## 2020-02-06 DIAGNOSIS — E6609 Other obesity due to excess calories: Secondary | ICD-10-CM | POA: Diagnosis not present

## 2020-03-27 DIAGNOSIS — E7849 Other hyperlipidemia: Secondary | ICD-10-CM | POA: Diagnosis not present

## 2020-03-27 DIAGNOSIS — K219 Gastro-esophageal reflux disease without esophagitis: Secondary | ICD-10-CM | POA: Diagnosis not present

## 2020-03-27 DIAGNOSIS — N390 Urinary tract infection, site not specified: Secondary | ICD-10-CM | POA: Diagnosis not present

## 2020-03-27 DIAGNOSIS — F329 Major depressive disorder, single episode, unspecified: Secondary | ICD-10-CM | POA: Diagnosis not present

## 2020-03-27 DIAGNOSIS — Z1389 Encounter for screening for other disorder: Secondary | ICD-10-CM | POA: Diagnosis not present

## 2020-03-27 DIAGNOSIS — J45909 Unspecified asthma, uncomplicated: Secondary | ICD-10-CM | POA: Diagnosis not present

## 2020-03-27 DIAGNOSIS — E119 Type 2 diabetes mellitus without complications: Secondary | ICD-10-CM | POA: Diagnosis not present

## 2020-03-27 DIAGNOSIS — Z6831 Body mass index (BMI) 31.0-31.9, adult: Secondary | ICD-10-CM | POA: Diagnosis not present

## 2020-03-27 DIAGNOSIS — I5022 Chronic systolic (congestive) heart failure: Secondary | ICD-10-CM | POA: Diagnosis not present

## 2020-03-27 DIAGNOSIS — Z0000 Encounter for general adult medical examination without abnormal findings: Secondary | ICD-10-CM | POA: Diagnosis not present

## 2020-03-27 DIAGNOSIS — E6609 Other obesity due to excess calories: Secondary | ICD-10-CM | POA: Diagnosis not present

## 2020-03-27 DIAGNOSIS — G894 Chronic pain syndrome: Secondary | ICD-10-CM | POA: Diagnosis not present

## 2020-03-27 DIAGNOSIS — F419 Anxiety disorder, unspecified: Secondary | ICD-10-CM | POA: Diagnosis not present

## 2020-03-27 DIAGNOSIS — E039 Hypothyroidism, unspecified: Secondary | ICD-10-CM | POA: Diagnosis not present

## 2020-05-01 DIAGNOSIS — E6609 Other obesity due to excess calories: Secondary | ICD-10-CM | POA: Diagnosis not present

## 2020-05-01 DIAGNOSIS — B355 Tinea imbricata: Secondary | ICD-10-CM | POA: Diagnosis not present

## 2020-05-01 DIAGNOSIS — Z6831 Body mass index (BMI) 31.0-31.9, adult: Secondary | ICD-10-CM | POA: Diagnosis not present

## 2020-05-25 DIAGNOSIS — Z6831 Body mass index (BMI) 31.0-31.9, adult: Secondary | ICD-10-CM | POA: Diagnosis not present

## 2020-05-25 DIAGNOSIS — Z23 Encounter for immunization: Secondary | ICD-10-CM | POA: Diagnosis not present

## 2020-05-25 DIAGNOSIS — E6609 Other obesity due to excess calories: Secondary | ICD-10-CM | POA: Diagnosis not present

## 2020-05-25 DIAGNOSIS — N342 Other urethritis: Secondary | ICD-10-CM | POA: Diagnosis not present

## 2020-06-10 ENCOUNTER — Other Ambulatory Visit (HOSPITAL_COMMUNITY): Payer: Self-pay | Admitting: Family Medicine

## 2020-06-10 DIAGNOSIS — N644 Mastodynia: Secondary | ICD-10-CM

## 2020-06-12 DIAGNOSIS — Z6831 Body mass index (BMI) 31.0-31.9, adult: Secondary | ICD-10-CM | POA: Diagnosis not present

## 2020-06-12 DIAGNOSIS — N61 Mastitis without abscess: Secondary | ICD-10-CM | POA: Diagnosis not present

## 2020-06-12 DIAGNOSIS — N6012 Diffuse cystic mastopathy of left breast: Secondary | ICD-10-CM | POA: Diagnosis not present

## 2020-07-09 DIAGNOSIS — B078 Other viral warts: Secondary | ICD-10-CM | POA: Diagnosis not present

## 2020-07-13 DIAGNOSIS — T466X5A Adverse effect of antihyperlipidemic and antiarteriosclerotic drugs, initial encounter: Secondary | ICD-10-CM | POA: Diagnosis not present

## 2020-07-13 DIAGNOSIS — Z79899 Other long term (current) drug therapy: Secondary | ICD-10-CM | POA: Diagnosis not present

## 2020-07-13 DIAGNOSIS — G20 Parkinson's disease: Secondary | ICD-10-CM | POA: Diagnosis not present

## 2020-07-13 DIAGNOSIS — R2689 Other abnormalities of gait and mobility: Secondary | ICD-10-CM | POA: Diagnosis not present

## 2020-07-13 DIAGNOSIS — G2401 Drug induced subacute dyskinesia: Secondary | ICD-10-CM | POA: Diagnosis not present

## 2020-07-13 DIAGNOSIS — G2111 Neuroleptic induced parkinsonism: Secondary | ICD-10-CM | POA: Diagnosis not present

## 2020-07-15 DIAGNOSIS — N644 Mastodynia: Secondary | ICD-10-CM | POA: Diagnosis not present

## 2020-07-15 DIAGNOSIS — N6321 Unspecified lump in the left breast, upper outer quadrant: Secondary | ICD-10-CM | POA: Diagnosis not present

## 2020-08-11 ENCOUNTER — Other Ambulatory Visit (HOSPITAL_COMMUNITY): Payer: Self-pay | Admitting: Neurology

## 2020-08-11 DIAGNOSIS — R2689 Other abnormalities of gait and mobility: Secondary | ICD-10-CM | POA: Diagnosis not present

## 2020-08-11 DIAGNOSIS — G2401 Drug induced subacute dyskinesia: Secondary | ICD-10-CM | POA: Diagnosis not present

## 2020-08-11 DIAGNOSIS — G2111 Neuroleptic induced parkinsonism: Secondary | ICD-10-CM | POA: Diagnosis not present

## 2020-08-11 DIAGNOSIS — Z79899 Other long term (current) drug therapy: Secondary | ICD-10-CM | POA: Diagnosis not present

## 2020-08-11 DIAGNOSIS — R296 Repeated falls: Secondary | ICD-10-CM | POA: Diagnosis not present

## 2020-08-11 DIAGNOSIS — G20 Parkinson's disease: Secondary | ICD-10-CM | POA: Diagnosis not present

## 2020-08-11 DIAGNOSIS — T466X5A Adverse effect of antihyperlipidemic and antiarteriosclerotic drugs, initial encounter: Secondary | ICD-10-CM | POA: Diagnosis not present

## 2020-08-26 ENCOUNTER — Ambulatory Visit (HOSPITAL_COMMUNITY)
Admission: RE | Admit: 2020-08-26 | Discharge: 2020-08-26 | Disposition: A | Discharge status: Home / Self Care | Payer: Medicare Other | Source: Ambulatory Visit | Attending: Neurology | Admitting: Neurology

## 2020-08-26 ENCOUNTER — Other Ambulatory Visit: Payer: Self-pay

## 2020-08-26 DIAGNOSIS — G319 Degenerative disease of nervous system, unspecified: Secondary | ICD-10-CM | POA: Diagnosis not present

## 2020-08-26 DIAGNOSIS — R296 Repeated falls: Secondary | ICD-10-CM | POA: Diagnosis not present

## 2020-08-26 DIAGNOSIS — R531 Weakness: Secondary | ICD-10-CM | POA: Diagnosis not present

## 2020-08-26 DIAGNOSIS — R200 Anesthesia of skin: Secondary | ICD-10-CM | POA: Diagnosis not present

## 2020-08-26 DIAGNOSIS — M4802 Spinal stenosis, cervical region: Secondary | ICD-10-CM | POA: Diagnosis not present

## 2020-09-08 ENCOUNTER — Ambulatory Visit (HOSPITAL_COMMUNITY): Payer: Medicare Other | Attending: Neurology | Admitting: Physical Therapy

## 2020-09-08 ENCOUNTER — Encounter (HOSPITAL_COMMUNITY): Payer: Self-pay | Admitting: Physical Therapy

## 2020-09-08 ENCOUNTER — Other Ambulatory Visit: Payer: Self-pay

## 2020-09-08 DIAGNOSIS — R262 Difficulty in walking, not elsewhere classified: Secondary | ICD-10-CM | POA: Diagnosis not present

## 2020-09-08 DIAGNOSIS — R296 Repeated falls: Secondary | ICD-10-CM | POA: Insufficient documentation

## 2020-09-08 NOTE — Therapy (Signed)
[facility] [address] [place], [place], [address] Phone: [phone]   Fax:  [phone]  Physical Therapy Evaluation  Patient Details  Name: [name] MRN: [id number] Date of Birth: 1956/08/08 Referring Provider (PT): Phillips Odor MD   Encounter Date: 09/08/2020   PT End of Session - 09/08/20 1708    Visit Number 1    Number of Visits 8    Date for PT Re-Evaluation 10/09/20    Authorization Type Medicare Part A    PT Start Time 1630    PT Stop Time [id number]    PT Time Calculation (min) 45 min    Equipment Utilized During Treatment Gait belt    Activity Tolerance Patient limited by fatigue    Behavior During Therapy [facility] for tasks assessed/performed           Past Medical History:  Diagnosis Date  . Acid reflux   . Asthma   . Falls frequently   . High cholesterol   . Migraines   . Pancreatitis, acute     Past Surgical History:  Procedure Laterality Date  . ABDOMINAL HYSTERECTOMY     Question abdominal  . CESAREAN SECTION    . FOOT SURGERY    . GALLBLADDER SURGERY      There were no vitals filed for this visit.    Subjective Assessment - 09/08/20 1641    Subjective Patient presents to therapy with complaint of decreased balance and frequent falls. She says this has been ongoing about 2-3 years. She feels things have gotten worse. She started a new medication recently but she says this has not helped very much. She thinks an OTC medication she recently began may be counteracting this other medicine. Patient says she uses AD as needed, will use both cane and walker. She says at times her legs feel like they will give out unexpectedly. She has had recent imaging which showed some ischemic changes and cerebral atrophy. Has been diagnosed with parkinsons    Pertinent History Parkinsons, asthma    Limitations Walking;Standing;House hold activities    Diagnostic tests MRI    Patient Stated Goals Walk by myself     Currently in Pain? Yes    Pain Score 4     Pain Location Knee    Pain Orientation Anterior;Right    Pain Type Chronic pain    Pain Onset More than a month ago    Pain Frequency Intermittent    Effect of Pain on Daily Activities Limiting              [facility] PT Assessment - 09/08/20 0001      Assessment   Medical Diagnosis frequent falls    Referring Provider (PT) Phillips Odor MD    Onset Date/Surgical Date --   2-3 years   Prior Therapy Yes for knee      Precautions   Precautions Fall      Restrictions   Weight Bearing Restrictions No      Balance Screen   Has the patient fallen in the past 6 months Yes    How many times? 5    Has the patient had a decrease in activity level because of a fear of falling?  Yes    Is the patient reluctant to leave their home because of a fear of falling?  Yes      [place] residence    Living Arrangements Spouse/significant other;Other relatives  Available Help at Discharge Family      Prior Function   Level of Independence Needs assistance with ADLs    Vocation On disability      Cognition   Overall Cognitive Status Within Functional Limits for tasks assessed      Transfers   Five time sit to stand comments  23 seconds with no UEs      Ambulation/Gait   Ambulation/Gait Yes    Ambulation/Gait Assistance 4: Min guard;5: Supervision    Assistive device None    Gait Pattern Decreased step length - left;Decreased stance time - right;Decreased arm swing - right;Decreased arm swing - left;Step-to pattern;Decreased hip/knee flexion - right;Decreased hip/knee flexion - left;Shuffle;Lateral trunk lean to right    Ambulation Surface Level;Indoor      Standardized Balance Assessment   Standardized Balance Assessment Dynamic Gait Index      Dynamic Gait Index   Level Surface Mild Impairment    Change in Gait Speed Mild Impairment    Gait with Horizontal Head Turns Moderate Impairment    Gait with  Vertical Head Turns Mild Impairment    Gait and Pivot Turn Mild Impairment    Step Over Obstacle Moderate Impairment    Step Around Obstacles Mild Impairment    Steps Moderate Impairment    Total Score 13                      Objective measurements completed on examination: See above findings.               PT Education - 09/08/20 1643    Education Details on evaluation findings, and POC    Person(s) Educated Patient    Methods Explanation    Comprehension Verbalized understanding            PT Short Term Goals - 09/08/20 1716      PT SHORT TERM GOAL #1   Title Patient will be independent with initial HEP and self-management strategies to improve functional outcomes    Time 2    Period Weeks    Status New    Target Date 09/25/20             PT Long Term Goals - 09/08/20 1716      PT LONG TERM GOAL #1   Title Patient will be able to perform stand x 5 in < 15 seconds to demonstrate improvement in functional mobility and reduced risk for falls.    Time 4    Period Weeks    Status New    Target Date 10/09/20      PT LONG TERM GOAL #2   Title Patient will be able to ambulate at least 275 feet during with LRAD to demonstrate improved ability to perform functional mobility and associated tasks.    Time 4    Period Weeks    Status New    Target Date 10/09/20      PT LONG TERM GOAL #3   Title Patient will improve DGI by at least 5 points to indicate improved balance, improved functional outcomes and decreased risk for falls.    Time 4    Period Weeks    Status New    Target Date 10/09/20                  Plan - 09/08/20 1709    Clinical Impression Statement Patient is a 65 y.o. female who presents to physical therapy with complaint of  decreased balance and frequent falls. Patient demonstrates decreased functional strength, balance deficits and gait abnormalities which are negatively impacting patient ability to perform ADLs and  functional mobility tasks. Patient will benefit from skilled physical therapy services to address these deficits to improve level of function with ADLs, functional mobility tasks, and reduce risk for falls.    Personal Factors and Comorbidities Comorbidity 2    Comorbidities parkinsons, asthma    Examination-Activity Limitations Locomotion Level;Stairs;Transfers    Examination-Participation Restrictions Yard Work;Community Activity;Cleaning    Stability/Clinical Decision Making Stable/Uncomplicated    Clinical Decision Making Low    Rehab Potential Fair    PT Frequency 2x / week    PT Duration 4 weeks    PT Treatment/Interventions ADLs/Self Care Home Management;Aquatic Therapy;Biofeedback;Cryotherapy;Canalith Repostioning;Electrical Stimulation;Iontophoresis 4mg /ml Dexamethasone;Moist Heat;Traction;Balance training;Manual lymph drainage;Therapeutic exercise;Manual techniques;Vasopneumatic Device;Vestibular;Therapeutic activities;Splinting;Functional mobility training;Stair training;Orthotic Fit/Training;Taping;Energy conservation;Joint Manipulations;Dry needling;Passive range of motion;Spinal Manipulations;Visual/perceptual remediation/compensation;Compression bandaging;Scar mobilization;Cognitive remediation;Patient/family education;Neuromuscular re-education;Parrafin;Fluidtherapy;Contrast Bath;DME Instruction;Gait training;Ultrasound    PT Next Visit Plan Review goals and issue HEP for LE strength and balance. Progress activity as tolerated, patient fatigues very quickly    PT Home Exercise Plan Issue at next visit    Consulted and Agree with Plan of Care Patient           Patient will benefit from skilled therapeutic intervention in order to improve the following deficits and impairments:  Abnormal gait,Decreased endurance,Cardiopulmonary status limiting activity,Decreased activity tolerance,Decreased balance,Decreased mobility,Difficulty walking,Decreased strength  Visit Diagnosis: Repeated  falls  Difficulty in walking, not elsewhere classified     Problem List Patient Active Problem List   Diagnosis Date Noted  . Confusion 04/03/2018  . Low back pain 07/08/2013  . Lumbar spondylosis 07/08/2013  . OA (osteoarthritis) of knee 02/11/2013  . Acute medial meniscus tear of left knee 01/16/2013  . Anxiety state 03/18/2009  . DEPRESSION 03/18/2009  . GERD 03/18/2009  . CONSTIPATION 03/18/2009  . FATTY LIVER DISEASE 03/18/2009  . BILIARY COLIC [id number]  . PANCREATITIS 03/18/2009  . UTI 03/18/2009    5:20 PM, 09/08/20 Josue Hector PT DPT  Physical Therapist with [facility]  [phone] [place] [address] [place], [place], [address] Phone: [phone]   Fax:  [phone]  Name: [name] MRN: [id number] Date of Birth: 04-19-1956

## 2020-09-10 ENCOUNTER — Encounter (HOSPITAL_COMMUNITY): Payer: Self-pay

## 2020-09-10 ENCOUNTER — Ambulatory Visit (HOSPITAL_COMMUNITY): Payer: Medicare Other

## 2020-09-10 ENCOUNTER — Other Ambulatory Visit: Payer: Self-pay

## 2020-09-10 DIAGNOSIS — R262 Difficulty in walking, not elsewhere classified: Secondary | ICD-10-CM | POA: Diagnosis not present

## 2020-09-10 DIAGNOSIS — R296 Repeated falls: Secondary | ICD-10-CM

## 2020-09-10 NOTE — Therapy (Signed)
[facility] [address] [place], [place], [address] Phone: [phone]   Fax:  [phone]  Physical Therapy Treatment  Patient Details  Name: [name] MRN: [id number] Date of Birth: [birth year] Referring Provider (PT): Beryle Beams MD   Encounter Date: 09/10/2020   PT End of Session - 09/10/20 1817    Visit Number 2    Number of Visits 8    Date for PT Re-Evaluation 10/09/20    Authorization Type Medicare Part A    PT Start Time 1748    PT Stop Time 1828    PT Time Calculation (min) 40 min    Equipment Utilized During Treatment Gait belt    Activity Tolerance Patient limited by fatigue;Patient limited by pain   Rt knee pain   Behavior During Therapy [facility] for tasks assessed/performed           Past Medical History:  Diagnosis Date  . Acid reflux   . Asthma   . Falls frequently   . High cholesterol   . Migraines   . Pancreatitis, acute     Past Surgical History:  Procedure Laterality Date  . ABDOMINAL HYSTERECTOMY     Question abdominal  . CESAREAN SECTION    . FOOT SURGERY    . GALLBLADDER SURGERY      There were no vitals filed for this visit.   Subjective Assessment - 09/10/20 1750    Subjective Pt reports increased pain Rt knee following sitting in lobby for long periods of time (arrived an hour before session).  Reports her arthritis can tell when cold weather is coming.    Pertinent History Parkinsons, asthma    Patient Stated Goals Walk by myself    Currently in Pain? Yes    Pain Score 7     Pain Location Knee    Pain Orientation Right    Pain Descriptors / Indicators Aching    Pain Type Chronic pain    Pain Onset More than a month ago    Pain Frequency Intermittent    Aggravating Factors  Sitting/standing too long    Pain Relieving Factors unsure                             [facility] Adult PT Treatment/Exercise - 09/10/20 0001      Ambulation/Gait   Ambulation Distance  (Feet) 30 Feet    Assistive device Rolling walker    Gait Pattern Decreased step length - left;Decreased stance time - right;Decreased arm swing - right;Decreased arm swing - left;Step-to pattern;Decreased hip/knee flexion - right;Decreased hip/knee flexion - left;Shuffle;Lateral trunk lean to right    Ambulation Surface Level;Indoor    Gait Comments Cueing for heel to toe and increased stride length      Exercises   Exercises Knee/Hip      Knee/Hip Exercises: Standing   Heel Raises Both;10 reps    Hip Abduction 10 reps    Abduction Limitations cueing to reduce ER    Hip Extension 10 reps;Knee straight    Extension Limitations cueing for knee extension    Gait Training 72ft with RW, cueing for gait mechanics    Other Standing Knee Exercises partial tandem stance intermittent HHA 2x 30"      Knee/Hip Exercises: Seated   Sit to Sand 2 sets;5 reps;without UE support   cueing for eccentric control  PT Education - 09/10/20 1754    Education Details Reviewed goals, educated importance of HEP complaince that was established this session.  Pt arrived ambulating with no AD, educated on benefits for gait with RW/cane for safety and pain control.    Person(s) Educated Patient    Methods Explanation;Handout;Verbal cues    Comprehension Verbalized understanding            PT Short Term Goals - 09/08/20 1716      PT SHORT TERM GOAL #1   Title Patient will be independent with initial HEP and self-management strategies to improve functional outcomes    Time 2    Period Weeks    Status New    Target Date 09/25/20             PT Long Term Goals - 09/08/20 1716      PT LONG TERM GOAL #1   Title Patient will be able to perform stand x 5 in < 15 seconds to demonstrate improvement in functional mobility and reduced risk for falls.    Time 4    Period Weeks    Status New    Target Date 10/09/20      PT LONG TERM GOAL #2   Title Patient will be able to ambulate  at least 275 feet during 2MWT with LRAD to demonstrate improved ability to perform functional mobility and associated tasks.    Time 4    Period Weeks    Status New    Target Date 10/09/20      PT LONG TERM GOAL #3   Title Patient will improve DGI by at least 5 points to indicate improved balance, improved functional outcomes and decreased risk for falls.    Time 4    Period Weeks    Status New    Target Date 10/09/20                 Plan - 09/10/20 1758    Clinical Impression Statement Reviewed goals, educated of importance of HEP compliance for maximal benefits, HEP established this session with printout given and ability to complete proepr form.  Pt arrived without AD.  Pt educated on benefits iwth use of AD to improve gait mechanics and pain control.  Gait training complete to improve heel to toe mechanics and cueing for increased stride length.  Therex focus on LE strengthening, cueing for form and mechanics.  Min A required wiht static partial tandem stance for balance training.    Personal Factors and Comorbidities Comorbidity 2    Comorbidities parkinsons, asthma    Examination-Activity Limitations Locomotion Level;Stairs;Transfers    Examination-Participation Restrictions Yard Work;Community Activity;Cleaning    Stability/Clinical Decision Making Stable/Uncomplicated    Clinical Decision Making Low    Rehab Potential Fair    PT Frequency 2x / week    PT Duration 4 weeks    PT Treatment/Interventions ADLs/Self Care Home Management;Aquatic Therapy;Biofeedback;Cryotherapy;Canalith Repostioning;Electrical Stimulation;Iontophoresis 4mg /ml Dexamethasone;Moist Heat;Traction;Balance training;Manual lymph drainage;Therapeutic exercise;Manual techniques;Vasopneumatic Device;Vestibular;Therapeutic activities;Splinting;Functional mobility training;Stair training;Orthotic Fit/Training;Taping;Energy conservation;Joint Manipulations;Dry needling;Passive range of motion;Spinal  Manipulations;Visual/perceptual remediation/compensation;Compression bandaging;Scar mobilization;Cognitive remediation;Patient/family education;Neuromuscular re-education;Parrafin;Fluidtherapy;Contrast Bath;DME Instruction;Gait training;Ultrasound    PT Next Visit Plan BIG, power, LE strength and balance. Progress activity as tolerated, patient fatigues very quickly    PT Home Exercise Plan 09/10/20: STS. heel raises           Patient will benefit from skilled therapeutic intervention in order to improve the following deficits and impairments:  Abnormal gait,Decreased endurance,Cardiopulmonary status limiting activity,Decreased  activity tolerance,Decreased balance,Decreased mobility,Difficulty walking,Decreased strength  Visit Diagnosis: Repeated falls  Difficulty in walking, not elsewhere classified     Problem List Patient Active Problem List   Diagnosis Date Noted  . Confusion 04/03/2018  . Low back pain 07/08/2013  . Lumbar spondylosis 07/08/2013  . OA (osteoarthritis) of knee 02/11/2013  . Acute medial meniscus tear of left knee 01/16/2013  . Anxiety state 03/18/2009  . DEPRESSION 03/18/2009  . GERD 03/18/2009  . CONSTIPATION 03/18/2009  . FATTY LIVER DISEASE 03/18/2009  . BILIARY COLIC [id number]  . PANCREATITIS 03/18/2009  . UTI 03/18/2009   Ihor Austin, LPTA/CLT; CBIS [phone]  Aldona Lento 09/10/2020, 6:35 PM  [facility] [address] [place], [place], [address] Phone: [phone]   Fax:  [phone]  Name: [name] MRN: [id number] Date of Birth: March 20, 1956

## 2020-09-16 ENCOUNTER — Other Ambulatory Visit: Payer: Self-pay

## 2020-09-16 ENCOUNTER — Ambulatory Visit (HOSPITAL_COMMUNITY): Payer: Medicare Other

## 2020-09-16 ENCOUNTER — Encounter (HOSPITAL_COMMUNITY): Payer: Self-pay

## 2020-09-16 DIAGNOSIS — R296 Repeated falls: Secondary | ICD-10-CM

## 2020-09-16 DIAGNOSIS — R262 Difficulty in walking, not elsewhere classified: Secondary | ICD-10-CM

## 2020-09-16 NOTE — Therapy (Signed)
[facility] [address] [place], [place], [address] Phone: [phone]   Fax:  [phone]  Physical Therapy Treatment  Patient Details  Name: [name] MRN: [id number] Date of Birth: 15-Aug-1956 Referring Provider (PT): Phillips Odor MD   Encounter Date: 09/16/2020   PT End of Session - 09/16/20 1753    Visit Number 3    Number of Visits 8    Date for PT Re-Evaluation 10/09/20    Authorization Type Medicare Part A    PT Start Time 5956    PT Stop Time 1830    PT Time Calculation (min) 42 min    Equipment Utilized During Treatment Gait belt    Activity Tolerance Patient tolerated treatment well    Behavior During Therapy [facility] for tasks assessed/performed           Past Medical History:  Diagnosis Date  . Acid reflux   . Asthma   . Falls frequently   . High cholesterol   . Migraines   . Pancreatitis, acute     Past Surgical History:  Procedure Laterality Date  . ABDOMINAL HYSTERECTOMY     Question abdominal  . CESAREAN SECTION    . FOOT SURGERY    . GALLBLADDER SURGERY      There were no vitals filed for this visit.   Subjective Assessment - 09/16/20 1746    Subjective Pt arrived without AD, reports she uses cane/RW at home.  No reports of recent fall.  Stated she is compliant wiht HEP daily.    Pertinent History Parkinsons, asthma    Patient Stated Goals Walk by myself    Currently in Pain? No/denies                             [facility] Adult PT Treatment/Exercise - 09/16/20 0001      Ambulation/Gait   Ambulation Distance (Feet) 50 Feet    Assistive device Rolling walker    Gait Pattern Decreased step length - left;Decreased stance time - right;Decreased arm swing - right;Decreased arm swing - left;Step-to pattern;Decreased hip/knee flexion - right;Decreased hip/knee flexion - left;Shuffle;Lateral trunk lean to right    Ambulation Surface Level;Indoor    Gait Comments slow cadence,  cueing for heel to toe and increase stride length      Knee/Hip Exercises: Standing   Hip Flexion 10 reps    Hip Flexion Limitations alternating marching 1 UE A    Gait Training 35ft with RW, cueing for increased stride length    Other Standing Knee Exercises agility ladder inside // bars 3RT with Bil then Uni UE support, big steps; 37ft with PVC pipes to improve UE swing    Other Standing Knee Exercises Power step, power up      Knee/Hip Exercises: Seated   Sit to Sand 10 reps;without UE support   shoulder flexion upon standing                   PT Short Term Goals - 09/08/20 1716      PT SHORT TERM GOAL #1   Title Patient will be independent with initial HEP and self-management strategies to improve functional outcomes    Time 2    Period Weeks    Status New    Target Date 09/25/20             PT Long Term Goals - 09/08/20 1716  PT LONG TERM GOAL #1   Title Patient will be able to perform stand x 5 in < 15 seconds to demonstrate improvement in functional mobility and reduced risk for falls.    Time 4    Period Weeks    Status New    Target Date 10/09/20      PT LONG TERM GOAL #2   Title Patient will be able to ambulate at least 275 feet during 2MWT with LRAD to demonstrate improved ability to perform functional mobility and associated tasks.    Time 4    Period Weeks    Status New    Target Date 10/09/20      PT LONG TERM GOAL #3   Title Patient will improve DGI by at least 5 points to indicate improved balance, improved functional outcomes and decreased risk for falls.    Time 4    Period Weeks    Status New    Target Date 10/09/20                 Plan - 09/16/20 1834    Clinical Impression Statement Session focus with BIG movements to improve gait mechanics and Power activities for balance training and strengthening.  Pt arrived without AD, educated importance of assistance to reduce risk of falls.  Gait training complete with agility  ladder for visual cueing for larger steps.  Min A required through session for LOB tendency for posterior lean.  EOS pt ambulated with RW out to car as we limited by fatigue for safety, encouraged her spouse to have ambulate with RW, verbalized understanding.    Personal Factors and Comorbidities Comorbidity 2    Comorbidities parkinsons, asthma    Examination-Activity Limitations Locomotion Level;Stairs;Transfers    Examination-Participation Restrictions Yard Work;Community Activity;Cleaning    Stability/Clinical Decision Making Stable/Uncomplicated    Clinical Decision Making Low    Rehab Potential Fair    PT Frequency 2x / week    PT Duration 4 weeks    PT Treatment/Interventions ADLs/Self Care Home Management;Aquatic Therapy;Biofeedback;Cryotherapy;Canalith Repostioning;Electrical Stimulation;Iontophoresis 4mg /ml Dexamethasone;Moist Heat;Traction;Balance training;Manual lymph drainage;Therapeutic exercise;Manual techniques;Vasopneumatic Device;Vestibular;Therapeutic activities;Splinting;Functional mobility training;Stair training;Orthotic Fit/Training;Taping;Energy conservation;Joint Manipulations;Dry needling;Passive range of motion;Spinal Manipulations;Visual/perceptual remediation/compensation;Compression bandaging;Scar mobilization;Cognitive remediation;Patient/family education;Neuromuscular re-education;Parrafin;Fluidtherapy;Contrast Bath;DME Instruction;Gait training;Ultrasound    PT Next Visit Plan BIG, power, LE strength and balance. Progress activity as tolerated, patient fatigues very quickly    PT Home Exercise Plan 09/10/20: STS. heel raises           Patient will benefit from skilled therapeutic intervention in order to improve the following deficits and impairments:  Abnormal gait,Decreased endurance,Cardiopulmonary status limiting activity,Decreased activity tolerance,Decreased balance,Decreased mobility,Difficulty walking,Decreased strength  Visit Diagnosis: Repeated  falls  Difficulty in walking, not elsewhere classified     Problem List Patient Active Problem List   Diagnosis Date Noted  . Confusion 04/03/2018  . Low back pain 07/08/2013  . Lumbar spondylosis 07/08/2013  . OA (osteoarthritis) of knee 02/11/2013  . Acute medial meniscus tear of left knee 01/16/2013  . Anxiety state 03/18/2009  . DEPRESSION 03/18/2009  . GERD 03/18/2009  . CONSTIPATION 03/18/2009  . FATTY LIVER DISEASE 03/18/2009  . BILIARY COLIC 14/43/1540  . PANCREATITIS 03/18/2009  . UTI 03/18/2009   Ihor Austin, LPTA/CLT; CBIS [phone]  Aldona Lento 09/16/2020, 6:41 PM  [facility] [address] [place], [place], [address] Phone: [phone]   Fax:  [phone]  Name: MARGERET STACHNIK MRN: [id number] Date of Birth: March 12, 1956

## 2020-09-18 ENCOUNTER — Ambulatory Visit (HOSPITAL_COMMUNITY): Payer: Medicare Other

## 2020-09-18 ENCOUNTER — Other Ambulatory Visit: Payer: Self-pay

## 2020-09-18 ENCOUNTER — Encounter (HOSPITAL_COMMUNITY): Payer: Self-pay

## 2020-09-18 VITALS — BP 170/103 | HR 89

## 2020-09-18 DIAGNOSIS — R262 Difficulty in walking, not elsewhere classified: Secondary | ICD-10-CM | POA: Diagnosis not present

## 2020-09-18 DIAGNOSIS — R296 Repeated falls: Secondary | ICD-10-CM

## 2020-09-18 NOTE — Therapy (Signed)
[place], [place], [address] Phone: [phone]   Fax:  [phone]  Physical Therapy Treatment  Patient Details  Name: [name] MRN: [id number] Date of Birth: September 22, 1955 Referring Provider (PT): Phillips Odor MD   Encounter Date: 09/18/2020   PT End of Session - 09/18/20 1547    Visit Number 4    Number of Visits 8    Date for PT Re-Evaluation 10/09/20    Authorization Type Medicare Part A    Progress Note Due on Visit 8    PT Start Time 0932    PT Stop Time 1615    PT Time Calculation (min) 41 min    Equipment Utilized During Treatment Gait belt    Activity Tolerance Patient tolerated treatment well;Patient limited by fatigue   c/o dizziness, vitals assessed.  Encouraged to call MD due to high BP   Behavior During Therapy [facility] for tasks assessed/performed           Past Medical History:  Diagnosis Date  . Acid reflux   . Asthma   . Falls frequently   . High cholesterol   . Migraines   . Pancreatitis, acute     Past Surgical History:  Procedure Laterality Date  . ABDOMINAL HYSTERECTOMY     Question abdominal  . CESAREAN SECTION    . FOOT SURGERY    . GALLBLADDER SURGERY      Vitals:   09/18/20 1608  BP: (!) 170/103  Pulse: 89     Subjective Assessment - 09/18/20 1546    Subjective Pt arrived with RW.  No reports of pain or recent fall.    Pertinent History Parkinsons, asthma    Patient Stated Goals Walk by myself    Currently in Pain? No/denies              [facility] PT Assessment - 09/18/20 0001      Assessment   Medical Diagnosis frequent falls    Referring Provider (PT) Phillips Odor MD    Onset Date/Surgical Date --   2-3 years   Prior Therapy Yes for knee      Precautions   Precautions Fall                         [facility] Adult PT Treatment/Exercise - 09/18/20 0001      Ambulation/Gait   Ambulation Distance (Feet) 226 Feet   11 min to complete    Assistive device Rolling walker    Gait Pattern Decreased step length - left;Decreased stance time - right;Decreased arm swing - right;Decreased arm swing - left;Step-to pattern;Decreased hip/knee flexion - right;Decreased hip/knee flexion - left;Shuffle;Lateral trunk lean to right    Ambulation Surface Level;Indoor    Gait Comments cueing for heel to toe, improved stride length though easily distracted and returns to shuffled gait.      Exercises   Exercises Knee/Hip      Knee/Hip Exercises: Standing   Hip Flexion 10 reps    Hip Flexion Limitations alternating marching no UE A    Gait Training [id number]    Other Standing Knee Exercises agility ladder inside // bars 3RT with Uni UE x 1 then no UE x 2    Other Standing Knee Exercises Power step, power up                    PT Short Term Goals - 09/08/20 1716  PT SHORT TERM GOAL #1   Title Patient will be independent with initial HEP and self-management strategies to improve functional outcomes    Time 2    Period Weeks    Status New    Target Date 09/25/20             PT Long Term Goals - 09/08/20 1716      PT LONG TERM GOAL #1   Title Patient will be able to perform stand x 5 in < 15 seconds to demonstrate improvement in functional mobility and reduced risk for falls.    Time 4    Period Weeks    Status New    Target Date 10/09/20      PT LONG TERM GOAL #2   Title Patient will be able to ambulate at least 275 feet during 2MWT with LRAD to demonstrate improved ability to perform functional mobility and associated tasks.    Time 4    Period Weeks    Status New    Target Date 10/09/20      PT LONG TERM GOAL #3   Title Patient will improve DGI by at least 5 points to indicate improved balance, improved functional outcomes and decreased risk for falls.    Time 4    Period Weeks    Status New    Target Date 10/09/20                 Plan - 09/18/20 1746    Clinical Impression Statement Pt arrived  with RW.  Therapist adjusted walk to appropraite height.  Pt presents with vast improvements with improved stride length, though easily distracted and returns to shuffled gait.  Continued BIG and PWR program activities for balance training, strengthening and to improve gait mechanics.  Pt c/o dizziness EOS, vitals taken with very high BP 170/103 mmHg.  Stopped session due to vitals.  Encouraged pt to contact MD concering current BP medication.  Did send message to MD concerning vitals.    Personal Factors and Comorbidities Comorbidity 2    Comorbidities parkinsons, asthma    Examination-Activity Limitations Locomotion Level;Stairs;Transfers    Examination-Participation Restrictions Yard Work;Community Activity;Cleaning    Stability/Clinical Decision Making Stable/Uncomplicated    Clinical Decision Making Low    Rehab Potential Fair    PT Frequency 2x / week    PT Duration 4 weeks    PT Treatment/Interventions ADLs/Self Care Home Management;Aquatic Therapy;Biofeedback;Cryotherapy;Canalith Repostioning;Electrical Stimulation;Iontophoresis 4mg /ml Dexamethasone;Moist Heat;Traction;Balance training;Manual lymph drainage;Therapeutic exercise;Manual techniques;Vasopneumatic Device;Vestibular;Therapeutic activities;Splinting;Functional mobility training;Stair training;Orthotic Fit/Training;Taping;Energy conservation;Joint Manipulations;Dry needling;Passive range of motion;Spinal Manipulations;Visual/perceptual remediation/compensation;Compression bandaging;Scar mobilization;Cognitive remediation;Patient/family education;Neuromuscular re-education;Parrafin;Fluidtherapy;Contrast Bath;DME Instruction;Gait training;Ultrasound    PT Next Visit Plan BIG, power, LE strength and balance. Progress activity as tolerated, patient fatigues very quickly    PT Home Exercise Plan 09/10/20: STS. heel raises           Patient will benefit from skilled therapeutic intervention in order to improve the following deficits and  impairments:  Abnormal gait,Decreased endurance,Cardiopulmonary status limiting activity,Decreased activity tolerance,Decreased balance,Decreased mobility,Difficulty walking,Decreased strength  Visit Diagnosis: Repeated falls  Difficulty in walking, not elsewhere classified     Problem List Patient Active Problem List   Diagnosis Date Noted  . Confusion 04/03/2018  . Low back pain 07/08/2013  . Lumbar spondylosis 07/08/2013  . OA (osteoarthritis) of knee 02/11/2013  . Acute medial meniscus tear of left knee 01/16/2013  . Anxiety state 03/18/2009  . DEPRESSION 03/18/2009  . GERD 03/18/2009  . CONSTIPATION 03/18/2009  .  FATTY LIVER DISEASE 03/18/2009  . BILIARY COLIC [id number]  . PANCREATITIS 03/18/2009  . UTI 03/18/2009   Ihor Austin, LPTA/CLT; CBIS [phone]  Aldona Lento 09/18/2020, 5:53 PM  [place] [address] [place], [place], [address] Phone: [phone]   Fax:  [phone]  Name: SHANIQUAH SAYARATH MRN: [id number] Date of Birth: 11/18/1955

## 2020-09-23 ENCOUNTER — Other Ambulatory Visit: Payer: Self-pay

## 2020-09-23 ENCOUNTER — Ambulatory Visit (HOSPITAL_COMMUNITY): Payer: Medicare Other | Admitting: Physical Therapy

## 2020-09-23 DIAGNOSIS — R262 Difficulty in walking, not elsewhere classified: Secondary | ICD-10-CM

## 2020-09-23 DIAGNOSIS — R296 Repeated falls: Secondary | ICD-10-CM | POA: Diagnosis not present

## 2020-09-23 NOTE — Therapy (Signed)
[place], [place], [address] Phone: [phone]   Fax:  [phone]  Physical Therapy Treatment  Patient Details  Name: [name] MRN: [id number] Date of Birth: 31-Jul-1956 Referring Provider (PT): Phillips Odor MD   Encounter Date: 09/23/2020   PT End of Session - 09/23/20 1710    Visit Number 5    Number of Visits 8    Date for PT Re-Evaluation 10/09/20    Authorization Type Medicare Part A    Progress Note Due on Visit 8    PT Start Time 6283    PT Stop Time 1703    PT Time Calculation (min) 48 min    Equipment Utilized During Treatment Gait belt    Activity Tolerance Patient tolerated treatment well;Patient limited by fatigue   c/o dizziness, vitals assessed.  Encouraged to call MD due to high BP   Behavior During Therapy [name] for tasks assessed/performed           Past Medical History:  Diagnosis Date  . Acid reflux   . Asthma   . Falls frequently   . High cholesterol   . Migraines   . Pancreatitis, acute     Past Surgical History:  Procedure Laterality Date  . ABDOMINAL HYSTERECTOMY     Question abdominal  . CESAREAN SECTION    . FOOT SURGERY    . GALLBLADDER SURGERY      There were no vitals filed for this visit.   Subjective Assessment - 09/23/20 1612    Subjective [name] states that she is completing her HEP    Pertinent History Parkinsons, asthma    Patient Stated Goals Walk by myself    Currently in Pain? No/denies                             [name] Adult PT Treatment/Exercise - 09/23/20 0001      Exercises   Exercises Knee/Hip      Knee/Hip Exercises: Stretches   Gastroc Stretch Limitations slant board 30" x 3      Knee/Hip Exercises: Standing   Other Standing Knee Exercises agility ladder inside // bars 3RT with Uni UE x 1 then no UE x 2    Other Standing Knee Exercises PWR UP< ROCK and step   done at beginning and end of treatment to "At  last "              Balance Exercises - 09/23/20 0001      Balance Exercises: Standing   Marching Upper extremity assist 1;15 reps;10 reps    Other Standing Exercises to these boots are walking, swayx 10, march x 10 heels out x 10    Other Standing Exercises Comments stand at wall hip abduction with heel touching wall at all times x 10               PT Short Term Goals - 09/08/20 1716      PT SHORT TERM GOAL #1   Title Patient will be independent with initial HEP and self-management strategies to improve functional outcomes    Time 2    Period Weeks    Status New    Target Date 09/25/20             PT Long Term Goals - 09/08/20 1716      PT LONG TERM GOAL #1   Title Patient will be  able to perform stand x 5 in < 15 seconds to demonstrate improvement in functional mobility and reduced risk for falls.    Time 4    Period Weeks    Status New    Target Date 10/09/20      PT LONG TERM GOAL #2   Title Patient will be able to ambulate at least 275 feet during 2MWT with LRAD to demonstrate improved ability to perform functional mobility and associated tasks.    Time 4    Period Weeks    Status New    Target Date 10/09/20      PT LONG TERM GOAL #3   Title Patient will improve DGI by at least 5 points to indicate improved balance, improved functional outcomes and decreased risk for falls.    Time 4    Period Weeks    Status New    Target Date 10/09/20                 Plan - 09/23/20 1712    Clinical Impression Statement Pt continues to show fear of falling.  Therapist worked on [employer] to improve wt shifting to music to improve fluidness of motion.  Pt demonstrates significant tightness in gastroc/hamstring mm and weakness in glut medius.  Slant board and wall abduction with heel keeping in contact with wall added to address these deficits.  Min guard assit needed for all activity    Personal Factors and Comorbidities Comorbidity 2    Comorbidities  parkinsons, asthma    Examination-Activity Limitations Locomotion Level;Stairs;Transfers    Examination-Participation Restrictions Yard Work;Community Activity;Cleaning    Stability/Clinical Decision Making Stable/Uncomplicated    Rehab Potential Fair    PT Frequency 2x / week    PT Duration 4 weeks    PT Treatment/Interventions ADLs/Self Care Home Management;Aquatic Therapy;Biofeedback;Cryotherapy;Canalith Repostioning;Electrical Stimulation;Iontophoresis 4mg /ml Dexamethasone;Moist Heat;Traction;Balance training;Manual lymph drainage;Therapeutic exercise;Manual techniques;Vasopneumatic Device;Vestibular;Therapeutic activities;Splinting;Functional mobility training;Stair training;Orthotic Fit/Training;Taping;Energy conservation;Joint Manipulations;Dry needling;Passive range of motion;Spinal Manipulations;Visual/perceptual remediation/compensation;Compression bandaging;Scar mobilization;Cognitive remediation;Patient/family education;Neuromuscular re-education;Parrafin;Fluidtherapy;Contrast Bath;DME Instruction;Gait training;Ultrasound    PT Next Visit Plan BIG, power, LE strength and balance. Progress activity as tolerated, patient fatigues very quickly    PT Home Exercise Plan 09/10/20: STS. heel raises           Patient will benefit from skilled therapeutic intervention in order to improve the following deficits and impairments:  Abnormal gait,Decreased endurance,Cardiopulmonary status limiting activity,Decreased activity tolerance,Decreased balance,Decreased mobility,Difficulty walking,Decreased strength  Visit Diagnosis: Repeated falls  Difficulty in walking, not elsewhere classified     Problem List Patient Active Problem List   Diagnosis Date Noted  . Confusion 04/03/2018  . Low back pain 07/08/2013  . Lumbar spondylosis 07/08/2013  . OA (osteoarthritis) of knee 02/11/2013  . Acute medial meniscus tear of left knee 01/16/2013  . Anxiety state 03/18/2009  . DEPRESSION 03/18/2009   . GERD 03/18/2009  . CONSTIPATION 03/18/2009  . FATTY LIVER DISEASE 03/18/2009  . BILIARY COLIC 59/93/5701  . PANCREATITIS 03/18/2009  . UTI 03/18/2009    Rayetta Humphrey, PT CLT [phone] 09/23/2020, 5:15 PM  [name] [address] [place], [place], [address] Phone: [phone]   Fax:  [phone]  Name: [name] MRN: [id number] Date of Birth: 1956-04-13

## 2020-09-24 ENCOUNTER — Ambulatory Visit (HOSPITAL_COMMUNITY): Payer: Medicare Other | Admitting: Physical Therapy

## 2020-09-24 DIAGNOSIS — R296 Repeated falls: Secondary | ICD-10-CM

## 2020-09-24 DIAGNOSIS — R262 Difficulty in walking, not elsewhere classified: Secondary | ICD-10-CM

## 2020-09-24 NOTE — Therapy (Signed)
[place], [place], [address] Phone: [phone]   Fax:  [phone]  Physical Therapy Treatment  Patient Details  Name: [name] MRN: [id number] Date of Birth: [birth year] Referring Provider (PT): Phillips Odor MD   Encounter Date: 09/24/2020   PT End of Session - 09/24/20 1719    Visit Number 6    Number of Visits 8    Date for PT Re-Evaluation 10/09/20    Authorization Type Medicare Part A    Progress Note Due on Visit 8    PT Start Time [id number]    PT Stop Time 1705    PT Time Calculation (min) 50 min    Equipment Utilized During Treatment Gait belt    Activity Tolerance Patient tolerated treatment well;Patient limited by fatigue   c/o dizziness, vitals assessed.  Encouraged to call MD due to high BP   Behavior During Therapy [facility] for tasks assessed/performed           Past Medical History:  Diagnosis Date  . Acid reflux   . Asthma   . Falls frequently   . High cholesterol   . Migraines   . Pancreatitis, acute     Past Surgical History:  Procedure Laterality Date  . ABDOMINAL HYSTERECTOMY     Question abdominal  . CESAREAN SECTION    . FOOT SURGERY    . GALLBLADDER SURGERY      There were no vitals filed for this visit.                      [facility] Adult PT Treatment/Exercise - 09/24/20 0001      Ambulation/Gait   Ambulation Distance (Feet) 100 Feet    Assistive device Rolling walker    Gait Pattern Decreased step length - left;Decreased stance time - right;Decreased arm swing - right;Decreased arm swing - left;Step-to pattern;Decreased hip/knee flexion - right;Decreased hip/knee flexion - left;Shuffle;Lateral trunk lean to right    Ambulation Surface Level;Indoor    Gait Comments cueing for heel to toe, improved stride length though easily distracted and returns to shuffled gait.      Exercises   Exercises Knee/Hip      Knee/Hip Exercises: Stretches   Active  Hamstring Stretch Both;3 reps;30 seconds    Active Hamstring Stretch Limitations standing with LE propped on 8" step, bil UE    Gastroc Stretch Limitations slant board 30" x 3      Knee/Hip Exercises: Standing   Heel Raises 15 reps    Hip Flexion 10 reps    Hip Flexion Limitations alternating marching no UE A (X 2 sets done along with music)    Hip Abduction 10 reps;2 sets    Abduction Limitations done against wall with walker in front    SLS with Vectors 5X5" holds each LE with bil UE assist    Other Standing Knee Exercises dance routine to "boots walk all over you" sway, alternating march, sway, heel taps alternating 2 sets (song played 2X)      Knee/Hip Exercises: Seated   Sit to Sand 10 reps;without UE support                    PT Short Term Goals - 09/08/20 1716      PT SHORT TERM GOAL #1   Title Patient will be independent with initial HEP and self-management strategies to improve functional outcomes    Time 2  Period Weeks    Status New    Target Date 09/25/20             PT Long Term Goals - 09/08/20 1716      PT LONG TERM GOAL #1   Title Patient will be able to perform stand x 5 in < 15 seconds to demonstrate improvement in functional mobility and reduced risk for falls.    Time 4    Period Weeks    Status New    Target Date 10/09/20      PT LONG TERM GOAL #2   Title Patient will be able to ambulate at least 275 feet during 2MWT with LRAD to demonstrate improved ability to perform functional mobility and associated tasks.    Time 4    Period Weeks    Status New    Target Date 10/09/20      PT LONG TERM GOAL #3   Title Patient will improve DGI by at least 5 points to indicate improved balance, improved functional outcomes and decreased risk for falls.    Time 4    Period Weeks    Status New    Target Date 10/09/20                 Plan - 09/24/20 1720    Clinical Impression Statement continued per established strengthening and  balance activities.  Focused on alternating, bigger movements.  pt with good balance and control today, some issues with coordination of movements having to stop and start over.  able to complete 2 rounds through music with cues.  Most challenging was completing hip abduction in correct form.  Improved correct mm recruitment with decreased ROM.  Pt required 2 short seated rest breaks during session today.Added vector activity to improve glute strength.   Also added standing hamsting stretch as extreme tightness noted.    Personal Factors and Comorbidities Comorbidity 2    Comorbidities parkinsons, asthma    Examination-Activity Limitations Locomotion Level;Stairs;Transfers    Examination-Participation Restrictions Yard Work;Community Activity;Cleaning    Stability/Clinical Decision Making Stable/Uncomplicated    Rehab Potential Fair    PT Frequency 2x / week    PT Duration 4 weeks    PT Treatment/Interventions ADLs/Self Care Home Management;Aquatic Therapy;Biofeedback;Cryotherapy;Canalith Repostioning;Electrical Stimulation;Iontophoresis 4mg /ml Dexamethasone;Moist Heat;Traction;Balance training;Manual lymph drainage;Therapeutic exercise;Manual techniques;Vasopneumatic Device;Vestibular;Therapeutic activities;Splinting;Functional mobility training;Stair training;Orthotic Fit/Training;Taping;Energy conservation;Joint Manipulations;Dry needling;Passive range of motion;Spinal Manipulations;Visual/perceptual remediation/compensation;Compression bandaging;Scar mobilization;Cognitive remediation;Patient/family education;Neuromuscular re-education;Parrafin;Fluidtherapy;Contrast Bath;DME Instruction;Gait training;Ultrasound    PT Next Visit Plan BIG, power, LE strength and balance. Progress activity as tolerated, patient fatigues very quickly    PT Home Exercise Plan 09/10/20: STS. heel raises           Patient will benefit from skilled therapeutic intervention in order to improve the following deficits and  impairments:  Abnormal gait,Decreased endurance,Cardiopulmonary status limiting activity,Decreased activity tolerance,Decreased balance,Decreased mobility,Difficulty walking,Decreased strength  Visit Diagnosis: Difficulty in walking, not elsewhere classified  Repeated falls     Problem List Patient Active Problem List   Diagnosis Date Noted  . Confusion 04/03/2018  . Low back pain 07/08/2013  . Lumbar spondylosis 07/08/2013  . OA (osteoarthritis) of knee 02/11/2013  . Acute medial meniscus tear of left knee 01/16/2013  . Anxiety state 03/18/2009  . DEPRESSION 03/18/2009  . GERD 03/18/2009  . CONSTIPATION 03/18/2009  . FATTY LIVER DISEASE 03/18/2009  . BILIARY COLIC 29/51/8841  . PANCREATITIS 03/18/2009  . UTI 03/18/2009   Teena Irani, PTA/CLT [phone]  Roseanne Reno B 09/24/2020, 5:28  PM  [place], [place], [address] Phone: [phone]   Fax:  [phone]  Name: AUJANAE MCCULLUM MRN: [id number] Date of Birth: May 08, 1956

## 2020-09-29 ENCOUNTER — Other Ambulatory Visit: Payer: Self-pay

## 2020-09-29 ENCOUNTER — Encounter (HOSPITAL_COMMUNITY): Payer: Self-pay

## 2020-09-29 ENCOUNTER — Ambulatory Visit (HOSPITAL_COMMUNITY): Payer: Medicare Other

## 2020-09-29 DIAGNOSIS — R296 Repeated falls: Secondary | ICD-10-CM | POA: Diagnosis not present

## 2020-09-29 DIAGNOSIS — R262 Difficulty in walking, not elsewhere classified: Secondary | ICD-10-CM

## 2020-09-29 NOTE — Therapy (Signed)
[facility] [address] [place], [place], [address] Phone: [phone]   Fax:  [phone]  Physical Therapy Treatment  Patient Details  Name: [name] MRN: [id number] Date of Birth: December 15, 1955 Referring Provider (PT): Phillips Odor MD   Encounter Date: 09/29/2020   PT End of Session - 09/29/20 1734    Visit Number 7    Number of Visits 8    Date for PT Re-Evaluation 10/09/20    Authorization Type Medicare Part A    Progress Note Due on Visit 8    PT Start Time 6962    PT Stop Time 1746    PT Time Calculation (min) 42 min    Equipment Utilized During Treatment Gait belt    Activity Tolerance Patient tolerated treatment well;Patient limited by fatigue    Behavior During Therapy [facility] for tasks assessed/performed           Past Medical History:  Diagnosis Date  . Acid reflux   . Asthma   . Falls frequently   . High cholesterol   . Migraines   . Pancreatitis, acute     Past Surgical History:  Procedure Laterality Date  . ABDOMINAL HYSTERECTOMY     Question abdominal  . CESAREAN SECTION    . FOOT SURGERY    . GALLBLADDER SURGERY      There were no vitals filed for this visit.   Subjective Assessment - 09/29/20 1733    Subjective Reports improved confidence with gait, arrived with RW for session.    Patient Stated Goals Walk by myself    Currently in Pain? No/denies                             [facility] Adult PT Treatment/Exercise - 09/29/20 0001      Ambulation/Gait   Ambulation Distance (Feet) 226 Feet   2 sets   Assistive device Rolling walker    Gait Pattern Decreased step length - left;Decreased stance time - right;Decreased arm swing - right;Decreased arm swing - left;Step-to pattern;Decreased hip/knee flexion - right;Decreased hip/knee flexion - left;Shuffle;Lateral trunk lean to right    Ambulation Surface Level;Indoor    Gait Comments cueing for heel to toe, improved stride length  though easily distracted and returns to shuffled gait.      Knee/Hip Exercises: Standing   Other Standing Knee Exercises PWR UP, PWR Step, PWR Reach; step over hurdle then reach for cone;               Balance Exercises - 09/29/20 0001      Balance Exercises: Standing   Sidestepping 2 reps    Other Standing Exercises step over hurdle and pick up cone wiht opposite UE 5x each    Other Standing Exercises Comments stand at wall hip abduction with heel touching wall at all times x 10               PT Short Term Goals - 09/08/20 1716      PT SHORT TERM GOAL #1   Title Patient will be independent with initial HEP and self-management strategies to improve functional outcomes    Time 2    Period Weeks    Status New    Target Date 09/25/20             PT Long Term Goals - 09/08/20 1716      PT LONG TERM GOAL #1  Title Patient will be able to perform stand x 5 in < 15 seconds to demonstrate improvement in functional mobility and reduced risk for falls.    Time 4    Period Weeks    Status New    Target Date 10/09/20      PT LONG TERM GOAL #2   Title Patient will be able to ambulate at least 275 feet during 2MWT with LRAD to demonstrate improved ability to perform functional mobility and associated tasks.    Time 4    Period Weeks    Status New    Target Date 10/09/20      PT LONG TERM GOAL #3   Title Patient will improve DGI by at least 5 points to indicate improved balance, improved functional outcomes and decreased risk for falls.    Time 4    Period Weeks    Status New    Target Date 10/09/20                 Plan - 09/29/20 1750    Clinical Impression Statement Added fresh tennis balls to RW, reports of increased ease pushing walker.  Pt presents with vast improvements with equal stance phase and appropriate stride length, verbal cueing for heel to toe mechanics.  Added hurdle with reaching PWR activity, pt required min A for safety, most difficulty  with SLS activities.  No reports of pain through session, was limited by fatigue.    Personal Factors and Comorbidities Comorbidity 2    Comorbidities parkinsons, asthma    Examination-Activity Limitations Locomotion Level;Stairs;Transfers    Examination-Participation Restrictions Yard Work;Community Activity;Cleaning    Stability/Clinical Decision Making Stable/Uncomplicated    Clinical Decision Making Low    Rehab Potential Fair    PT Frequency 2x / week    PT Duration 4 weeks    PT Treatment/Interventions ADLs/Self Care Home Management;Aquatic Therapy;Biofeedback;Cryotherapy;Canalith Repostioning;Electrical Stimulation;Iontophoresis 4mg /ml Dexamethasone;Moist Heat;Traction;Balance training;Manual lymph drainage;Therapeutic exercise;Manual techniques;Vasopneumatic Device;Vestibular;Therapeutic activities;Splinting;Functional mobility training;Stair training;Orthotic Fit/Training;Taping;Energy conservation;Joint Manipulations;Dry needling;Passive range of motion;Spinal Manipulations;Visual/perceptual remediation/compensation;Compression bandaging;Scar mobilization;Cognitive remediation;Patient/family education;Neuromuscular re-education;Parrafin;Fluidtherapy;Contrast Bath;DME Instruction;Gait training;Ultrasound    PT Next Visit Plan Progress note next session.  BIG, power, LE strength and balance. Progress activity as tolerated, patient fatigues very quickly    PT Home Exercise Plan 09/10/20: STS. heel raises           Patient will benefit from skilled therapeutic intervention in order to improve the following deficits and impairments:  Abnormal gait,Decreased endurance,Cardiopulmonary status limiting activity,Decreased activity tolerance,Decreased balance,Decreased mobility,Difficulty walking,Decreased strength  Visit Diagnosis: Repeated falls  Difficulty in walking, not elsewhere classified     Problem List Patient Active Problem List   Diagnosis Date Noted  . Confusion 04/03/2018   . Low back pain 07/08/2013  . Lumbar spondylosis 07/08/2013  . OA (osteoarthritis) of knee 02/11/2013  . Acute medial meniscus tear of left knee 01/16/2013  . Anxiety state 03/18/2009  . DEPRESSION 03/18/2009  . GERD 03/18/2009  . CONSTIPATION 03/18/2009  . FATTY LIVER DISEASE 03/18/2009  . BILIARY COLIC 83/15/1761  . PANCREATITIS 03/18/2009  . UTI 03/18/2009   Ihor Austin, LPTA/CLT; CBIS [phone]  Aldona Lento 09/29/2020, 6:33 PM  [facility] [address] [place], [place], [address] Phone: [phone]   Fax:  [phone]  Name: [name] MRN: [id number] Date of Birth: 1956/02/06

## 2020-10-01 ENCOUNTER — Encounter (HOSPITAL_COMMUNITY): Payer: Medicare Other

## 2020-10-06 ENCOUNTER — Ambulatory Visit (HOSPITAL_COMMUNITY): Payer: Medicare Other | Admitting: Physical Therapy

## 2020-10-06 ENCOUNTER — Telehealth (HOSPITAL_COMMUNITY): Payer: Self-pay | Admitting: Physical Therapy

## 2020-10-06 NOTE — Telephone Encounter (Signed)
pt cancelled appt for today because she does not feel well

## 2020-10-08 ENCOUNTER — Encounter (HOSPITAL_COMMUNITY): Payer: Medicare Other

## 2020-10-09 ENCOUNTER — Encounter (HOSPITAL_COMMUNITY): Payer: Self-pay | Admitting: Physical Therapy

## 2020-10-09 ENCOUNTER — Ambulatory Visit (HOSPITAL_COMMUNITY): Payer: Medicare Other | Attending: Neurology | Admitting: Physical Therapy

## 2020-10-09 ENCOUNTER — Other Ambulatory Visit: Payer: Self-pay

## 2020-10-09 DIAGNOSIS — R262 Difficulty in walking, not elsewhere classified: Secondary | ICD-10-CM | POA: Diagnosis not present

## 2020-10-09 DIAGNOSIS — R296 Repeated falls: Secondary | ICD-10-CM | POA: Diagnosis not present

## 2020-10-09 NOTE — Therapy (Signed)
[facility] [address] [place], [place], [address] Phone: [phone]   Fax:  [phone]  Physical Therapy Treatment  Patient Details  Name: [name] MRN: [id number] Date of Birth: 29-Nov-1955 Referring Provider (PT): Phyllis Odor MD   Progress Note Reporting Period 09/08/2020 to 10/09/2020  See note below for Objective Data and Assessment of Progress/Goals.       Encounter Date: 10/09/2020   PT End of Session - 10/09/20 1638    Visit Number 8    Number of Visits 12    Date for PT Re-Evaluation 10/23/20    Authorization Type Medicare Part A    Progress Note Due on Visit 12    PT Start Time 1555    PT Stop Time 1640    PT Time Calculation (min) 45 min    Equipment Utilized During Treatment Gait belt    Activity Tolerance Patient tolerated treatment well;Patient limited by fatigue    Behavior During Therapy [facility] for tasks assessed/performed           Past Medical History:  Diagnosis Date   Acid reflux    Asthma    Falls frequently    High cholesterol    Migraines    Pancreatitis, acute     Past Surgical History:  Procedure Laterality Date   ABDOMINAL HYSTERECTOMY     Question abdominal   CESAREAN SECTION     FOOT SURGERY     GALLBLADDER SURGERY      There were no vitals filed for this visit.   Subjective Assessment - 10/09/20 1556    Subjective Pt states that she has been doing some of her exercises; states that she feels that therapy has helped her quite a bit.    Pertinent History Parkinsons, asthma    Diagnostic tests MRI    Patient Stated Goals Walk by myself    Currently in Pain? No/denies              [facility] PT Assessment - 10/09/20 0001      Assessment   Medical Diagnosis frequent falls    Referring Provider (PT) Phyllis Odor MD    Onset Date/Surgical Date --   2-3 years   Next MD Visit 10/14/2020    Prior Therapy Yes for knee      Precautions   Precautions Fall     Precaution Comments no falls since she has started therapy      Restrictions   Weight Bearing Restrictions No      Home Environment   Living Environment Private residence    Living Arrangements Spouse/significant other;Other relatives    Available Help at Discharge Family      Prior Function   Level of Independence Needs assistance with ADLs    Vocation On disability      Cognition   Overall Cognitive Status Within Functional Limits for tasks assessed      Functional Tests   Functional tests Single leg stance      Single Leg Stance   Comments Rt 5 seconds Lt 0      ROM / Strength   AROM / PROM / Strength Strength      Strength   Strength Assessment Site Knee;Hip;Ankle    Right/Left Hip Right;Left    Right Hip Flexion 5/5    Right Hip Extension 3-/5    Right Hip ABduction 3+/5    Left Hip Flexion 5/5    Left Hip Extension 3-/5  Left Hip ABduction 3+/5    Right/Left Knee Right;Left    Right Knee Flexion 5/5    Right Knee Extension 5/5    Left Knee Flexion 5/5    Left Knee Extension 5/5    Right/Left Ankle Right;Left    Right Ankle Dorsiflexion 4-/5    Left Ankle Dorsiflexion 5/5      Transfers   Five time sit to stand comments  21 seconds with no UEs   was 23     Ambulation/Gait   Ambulation Distance (Feet) 282 Feet    Assistive device Rolling walker    Gait Pattern Decreased stance time - right;Decreased arm swing - right;Decreased arm swing - left;Step-to pattern;Shuffle;Lateral trunk lean to right;Decreased dorsiflexion - left    Gait Comments decreased step length R decreased arm swing and trunk rotation      Standardized Balance Assessment   Standardized Balance Assessment Dynamic Gait Index      Dynamic Gait Index   Level Surface Mild Impairment    Change in Gait Speed Mild Impairment    Gait with Horizontal Head Turns Mild Impairment    Gait with Vertical Head Turns Mild Impairment    Gait and Pivot Turn Mild Impairment    Step Over Obstacle  Moderate Impairment    Step Around Obstacles Mild Impairment    Steps Mild Impairment    Total Score 15    DGI comment: was 68                         [facility] Adult PT Treatment/Exercise - 10/09/20 0001      Exercises   Exercises Knee/Hip      Knee/Hip Exercises: Stretches   Other Knee/Hip Stretches trunk rotation with opposite arm  x 10      Knee/Hip Exercises: Supine   Bridges Both      Knee/Hip Exercises: Sidelying   Hip ABduction Strengthening;Both;10 reps                  PT Education - 10/09/20 1654    Education Details HEP of bridge, trunk rotation and hip abduction    Person(s) Educated Patient    Methods Explanation;Verbal cues;Handout    Comprehension Verbalized understanding;Returned demonstration            PT Short Term Goals - 10/09/20 1627      PT SHORT TERM GOAL #1   Title Patient will be independent with initial HEP and self-management strategies to improve functional outcomes    Time 2    Period Weeks    Status Partially Met    Target Date 09/25/20             PT Long Term Goals - 10/09/20 1628      PT LONG TERM GOAL #1   Title Patient will be able to perform stand x 5 in < 15 seconds to demonstrate improvement in functional mobility and reduced risk for falls.    Time 4    Period Weeks    Status Not Met      PT LONG TERM GOAL #2   Title Patient will be able to ambulate at least 275 feet during 2MWT with LRAD to demonstrate improved ability to perform functional mobility and associated tasks.    Time 4    Period Weeks    Status Achieved      PT LONG TERM GOAL #3   Title Patient will improve DGI by at least  5 points to indicate improved balance, improved functional outcomes and decreased risk for falls.    Time 4    Period Weeks    Status Not Met                 Plan - 10/09/20 1655    Clinical Impression Statement PT reassessed.  She has made some gains but still has decreased gluteal strength B,  decreased power and decreased balance.  [name] will continue to benefit from skilled PT to work on these deficits and expand her HEP.    Personal Factors and Comorbidities Comorbidity 2    Comorbidities parkinsons, asthma    Examination-Activity Limitations Locomotion Level;Stairs;Transfers    Examination-Participation Restrictions Yard Work;Community Activity;Cleaning    Stability/Clinical Decision Making Stable/Uncomplicated    Rehab Potential Fair    PT Frequency 2x / week    PT Duration 6 weeks    PT Treatment/Interventions ADLs/Self Care Home Management;Aquatic Therapy;Biofeedback;Cryotherapy;Canalith Repostioning;Electrical Stimulation;Iontophoresis 9m/ml Dexamethasone;Moist Heat;Traction;Balance training;Manual lymph drainage;Therapeutic exercise;Manual techniques;Vasopneumatic Device;Vestibular;Therapeutic activities;Splinting;Functional mobility training;Stair training;Orthotic Fit/Training;Taping;Energy conservation;Joint Manipulations;Dry needling;Passive range of motion;Spinal Manipulations;Visual/perceptual remediation/compensation;Compression bandaging;Scar mobilization;Cognitive remediation;Patient/family education;Neuromuscular re-education;Parrafin;Fluidtherapy;Contrast Bath;DME Instruction;Gait training;Ultrasound    PT Next Visit Plan .  BIG, power, LE gluteal  strength and balance. Progress activity as tolerated, patient fatigues very quickly; progress HEP    PT Home Exercise Plan 09/10/20: STS. heel raises, 2/4: trunk rotation, bridge, hip abduction           Patient will benefit from skilled therapeutic intervention in order to improve the following deficits and impairments:  Abnormal gait,Decreased endurance,Cardiopulmonary status limiting activity,Decreased activity tolerance,Decreased balance,Decreased mobility,Difficulty walking,Decreased strength  Visit Diagnosis: Difficulty in walking, not elsewhere classified  Repeated falls     Problem List Patient  Active Problem List   Diagnosis Date Noted   Confusion 04/03/2018   Low back pain 07/08/2013   Lumbar spondylosis 07/08/2013   OA (osteoarthritis) of knee 02/11/2013   Acute medial meniscus tear of left knee 01/16/2013   Anxiety state 03/18/2009   DEPRESSION 03/18/2009   GERD 03/18/2009   CONSTIPATION 03/18/2009   FATTY LIVER DISEASE 044/71/5806  BILIARY COLIC 038/68/5488  PANCREATITIS 03/18/2009   UTI 03/18/2009    [name] PT CLT [phone] 16342/12/2020, 4:57 PM  [id number] [address]Phone: [phone]  Fax:  [phone] Name: Phyllis OSTENMRN: [id number] of Birth: [birth year]

## 2020-10-13 ENCOUNTER — Ambulatory Visit (HOSPITAL_COMMUNITY): Payer: Medicare Other | Admitting: Physical Therapy

## 2020-10-13 ENCOUNTER — Other Ambulatory Visit: Payer: Self-pay

## 2020-10-13 DIAGNOSIS — R262 Difficulty in walking, not elsewhere classified: Secondary | ICD-10-CM | POA: Diagnosis not present

## 2020-10-13 DIAGNOSIS — R296 Repeated falls: Secondary | ICD-10-CM | POA: Diagnosis not present

## 2020-10-13 NOTE — Therapy (Signed)
[facility] [address] [place], [place], [address] Phone: [phone]   Fax:  [phone]  Physical Therapy Treatment  Patient Details  Name: [name] MRN: [id number] Date of Birth: [birth year] Referring Provider (PT): Beryle Beams MD   Encounter Date: 10/13/2020   PT End of Session - 10/13/20 1637    Visit Number 9    Number of Visits 12    Date for PT Re-Evaluation 10/23/20    Authorization Type Medicare Part A    Progress Note Due on Visit 12    PT Start Time 1610    PT Stop Time 1655    PT Time Calculation (min) 45 min    Equipment Utilized During Treatment Gait belt    Activity Tolerance Patient tolerated treatment well;Patient limited by fatigue    Behavior During Therapy [facility] for tasks assessed/performed           Past Medical History:  Diagnosis Date  . Acid reflux   . Asthma   . Falls frequently   . High cholesterol   . Migraines   . Pancreatitis, acute     Past Surgical History:  Procedure Laterality Date  . ABDOMINAL HYSTERECTOMY     Question abdominal  . CESAREAN SECTION    . FOOT SURGERY    . GALLBLADDER SURGERY      There were no vitals filed for this visit.   Subjective Assessment - 10/13/20 1610    Subjective Pt has no complaint    Pertinent History Parkinsons, asthma    Diagnostic tests MRI    Patient Stated Goals Walk by myself    Currently in Pain? No/denies                    [facility] Adult PT Treatment/Exercise - 10/13/20 0001      Ambulation/Gait   Ambulation Distance (Feet) 226 Feet   2 sets   Assistive device Rolling walker    Gait Pattern --   working on larger step lengths     Exercises   Exercises Knee/Hip      Knee/Hip Exercises: Stretches   Gastroc Stretch Limitations slant board 30" x 3               Balance Exercises - 10/13/20 0001      Balance Exercises: Standing   Tandem Stance Eyes open;3 reps    Sidestepping 2 reps;Theraband    Marching  Solid surface;10 reps    Heel Raises 15 reps    Toe Raise 15 reps    Sit to Stand Standard surface    Other Standing Exercises hip abduction/extension B x 15 rep with red t band; power shift    Other Standing Exercises Comments power shift; dance revolution moves               PT Short Term Goals - 10/09/20 1627      PT SHORT TERM GOAL #1   Title Patient will be independent with initial HEP and self-management strategies to improve functional outcomes    Time 2    Period Weeks    Status Partially Met    Target Date 09/25/20             PT Long Term Goals - 10/09/20 1628      PT LONG TERM GOAL #1   Title Patient will be able to perform stand x 5 in < 15 seconds to demonstrate improvement in functional mobility and reduced  risk for falls.    Time 4    Period Weeks    Status Not Met      PT LONG TERM GOAL #2   Title Patient will be able to ambulate at least 275 feet during 2MWT with LRAD to demonstrate improved ability to perform functional mobility and associated tasks.    Time 4    Period Weeks    Status Achieved      PT LONG TERM GOAL #3   Title Patient will improve DGI by at least 5 points to indicate improved balance, improved functional outcomes and decreased risk for falls.    Time 4    Period Weeks    Status Not Met                 Plan - 10/13/20 1637    Clinical Impression Statement Added tband to side step as well as hip extension and abduction. Added tandem stance exercises to program.  Pt continues to have significant weakness in gluteal mm and tends to use substitutional patterns.    Personal Factors and Comorbidities Comorbidity 2    Comorbidities parkinsons, asthma    Examination-Activity Limitations Locomotion Level;Stairs;Transfers    Examination-Participation Restrictions Yard Work;Community Activity;Cleaning    Stability/Clinical Decision Making Stable/Uncomplicated    Rehab Potential Fair    PT Frequency 2x / week    PT Duration 6  weeks    PT Treatment/Interventions ADLs/Self Care Home Management;Aquatic Therapy;Biofeedback;Cryotherapy;Canalith Repostioning;Electrical Stimulation;Iontophoresis 71m/ml Dexamethasone;Moist Heat;Traction;Balance training;Manual lymph drainage;Therapeutic exercise;Manual techniques;Vasopneumatic Device;Vestibular;Therapeutic activities;Splinting;Functional mobility training;Stair training;Orthotic Fit/Training;Taping;Energy conservation;Joint Manipulations;Dry needling;Passive range of motion;Spinal Manipulations;Visual/perceptual remediation/compensation;Compression bandaging;Scar mobilization;Cognitive remediation;Patient/family education;Neuromuscular re-education;Parrafin;Fluidtherapy;Contrast Bath;DME Instruction;Gait training;Ultrasound    PT Next Visit Plan .  BIG, power, LE gluteal  strength and balance. Progress activity as tolerated, patient fatigues very quickly; progress HEP    PT Home Exercise Plan 09/10/20: STS. heel raises, 2/4: trunk rotation, bridge, hip abduction           Patient will benefit from skilled therapeutic intervention in order to improve the following deficits and impairments:  Abnormal gait,Decreased endurance,Cardiopulmonary status limiting activity,Decreased activity tolerance,Decreased balance,Decreased mobility,Difficulty walking,Decreased strength  Visit Diagnosis: Difficulty in walking, not elsewhere classified  Repeated falls     Problem List Patient Active Problem List   Diagnosis Date Noted  . Confusion 04/03/2018  . Low back pain 07/08/2013  . Lumbar spondylosis 07/08/2013  . OA (osteoarthritis) of knee 02/11/2013  . Acute medial meniscus tear of left knee 01/16/2013  . Anxiety state 03/18/2009  . DEPRESSION 03/18/2009  . GERD 03/18/2009  . CONSTIPATION 03/18/2009  . FATTY LIVER DISEASE 03/18/2009  . BILIARY COLIC 003/88/8280 . PANCREATITIS 03/18/2009  . UTI 03/18/2009   CRayetta Humphrey PT CLT [phone] 65292/04/2021, 5:02 PM  [address] [place] [address]Phone: [phone]  Fax:  [phone] Name: Phyllis TAMASHIROMRN: [id number] of Birth: [birth year]

## 2020-10-14 DIAGNOSIS — G20 Parkinson's disease: Secondary | ICD-10-CM | POA: Diagnosis not present

## 2020-10-14 DIAGNOSIS — G2401 Drug induced subacute dyskinesia: Secondary | ICD-10-CM | POA: Diagnosis not present

## 2020-10-14 DIAGNOSIS — G2111 Neuroleptic induced parkinsonism: Secondary | ICD-10-CM | POA: Diagnosis not present

## 2020-10-14 DIAGNOSIS — T466X5A Adverse effect of antihyperlipidemic and antiarteriosclerotic drugs, initial encounter: Secondary | ICD-10-CM | POA: Diagnosis not present

## 2020-10-14 DIAGNOSIS — R2689 Other abnormalities of gait and mobility: Secondary | ICD-10-CM | POA: Diagnosis not present

## 2020-10-14 DIAGNOSIS — R296 Repeated falls: Secondary | ICD-10-CM | POA: Diagnosis not present

## 2020-10-14 DIAGNOSIS — Z79899 Other long term (current) drug therapy: Secondary | ICD-10-CM | POA: Diagnosis not present

## 2020-10-15 ENCOUNTER — Other Ambulatory Visit: Payer: Self-pay

## 2020-10-15 ENCOUNTER — Ambulatory Visit (HOSPITAL_COMMUNITY): Payer: Medicare Other | Admitting: Physical Therapy

## 2020-10-15 ENCOUNTER — Encounter (HOSPITAL_COMMUNITY): Payer: Self-pay | Admitting: Physical Therapy

## 2020-10-15 DIAGNOSIS — R296 Repeated falls: Secondary | ICD-10-CM

## 2020-10-15 DIAGNOSIS — R262 Difficulty in walking, not elsewhere classified: Secondary | ICD-10-CM

## 2020-10-15 NOTE — Therapy (Signed)
[facility] [address] [place], [place], [address] Phone: [phone]   Fax:  [phone]  Physical Therapy Treatment  Patient Details  Name: [name] MRN: [id number] Date of Birth: 12/03/1955 Referring Provider (PT): Kofi Doonquah MD   Encounter Date: 10/15/2020   PT End of Session - 10/15/20 1619    Visit Number 10    Number of Visits 12    Date for PT Re-Evaluation 10/23/20    Authorization Type Medicare Part A    Progress Note Due on Visit 12    PT Start Time 1619    PT Stop Time 1657    PT Time Calculation (min) 38 min    Equipment Utilized During Treatment Gait belt    Activity Tolerance Patient tolerated treatment well;Patient limited by fatigue    Behavior During Therapy WFL for tasks assessed/performed           Past Medical History:  Diagnosis Date  . Acid reflux   . Asthma   . Falls frequently   . High cholesterol   . Migraines   . Pancreatitis, acute     Past Surgical History:  Procedure Laterality Date  . ABDOMINAL HYSTERECTOMY     Question abdominal  . CESAREAN SECTION    . FOOT SURGERY    . GALLBLADDER SURGERY      There were no vitals filed for this visit.   Subjective Assessment - 10/15/20 1624    Subjective States she has been doing her exercises when she can but some of them she can't do some as no one is home with her during the day and she is afraid of falling. States she feels more comfortable laying down    Pertinent History Parkinsons, asthma    Diagnostic tests MRI    Patient Stated Goals Walk by myself              [facility] PT Assessment - 10/15/20 0001      Assessment   Medical Diagnosis frequent falls    Referring Provider (PT) Kofi Doonquah MD                         [facility] Adult PT Treatment/Exercise - 10/15/20 0001      Knee/Hip Exercises: Standing   Knee Flexion AROM;Both;3 sets;15 reps   with music and UE assist as needed   Other Standing Knee  Exercises side stepping with large    Other Standing Knee Exercises monster steps wiht arms up and out x15 B with PT support; alternating foot taps on 4" step with music and min assist - x20 B; lateral foot taps on 4" box ( one hand assist wiht left leg taps 2x10 B; standing trunk twists and then hip abd and then shoulder reaches across body with music 38 bilateral and each      Knee/Hip Exercises: Seated   Sit to Sand 3 sets;10 reps;without UE support   big arms                   PT Short Term Goals - 10/09/20 1627      PT SHORT TERM GOAL #1   Title Patient will be independent with initial HEP and self-management strategies to improve functional outcomes    Time 2    Period Weeks    Status Partially Met    Target Date 09/25/20               PT Long Term Goals - 10/09/20 1628      PT LONG TERM GOAL #1   Title Patient will be able to perform stand x 5 in < 15 seconds to demonstrate improvement in functional mobility and reduced risk for falls.    Time 4    Period Weeks    Status Not Met      PT LONG TERM GOAL #2   Title Patient will be able to ambulate at least 275 feet during 2MWT with LRAD to demonstrate improved ability to perform functional mobility and associated tasks.    Time 4    Period Weeks    Status Achieved      PT LONG TERM GOAL #3   Title Patient will improve DGI by at least 5 points to indicate improved balance, improved functional outcomes and decreased risk for falls.    Time 4    Period Weeks    Status Not Met                 Plan - 10/15/20 1619    Clinical Impression Statement Continued to progress standing balance and strengthening as tolerated. More challenging for patient to stand on right leg. Used music to time movements to music/to the beat. Tolerated moderately well but patient tired quickly and required rest breaks reported fatigue end of session.    Personal Factors and Comorbidities Comorbidity 2    Comorbidities parkinsons,  asthma    Examination-Activity Limitations Locomotion Level;Stairs;Transfers    Examination-Participation Restrictions Yard Work;Community Activity;Cleaning    Stability/Clinical Decision Making Stable/Uncomplicated    Rehab Potential Fair    PT Frequency 2x / week    PT Duration 6 weeks    PT Treatment/Interventions ADLs/Self Care Home Management;Aquatic Therapy;Biofeedback;Cryotherapy;Canalith Repostioning;Electrical Stimulation;Iontophoresis 56m/ml Dexamethasone;Moist Heat;Traction;Balance training;Manual lymph drainage;Therapeutic exercise;Manual techniques;Vasopneumatic Device;Vestibular;Therapeutic activities;Splinting;Functional mobility training;Stair training;Orthotic Fit/Training;Taping;Energy conservation;Joint Manipulations;Dry needling;Passive range of motion;Spinal Manipulations;Visual/perceptual remediation/compensation;Compression bandaging;Scar mobilization;Cognitive remediation;Patient/family education;Neuromuscular re-education;Parrafin;Fluidtherapy;Contrast Bath;DME Instruction;Gait training;Ultrasound    PT Next Visit Plan .  BIG, power, LE gluteal  strength and balance. Progress activity as tolerated, patient fatigues very quickly; progress HEP    PT Home Exercise Plan 09/10/20: STS. heel raises, 2/4: trunk rotation, bridge, hip abduction           Patient will benefit from skilled therapeutic intervention in order to improve the following deficits and impairments:  Abnormal gait,Decreased endurance,Cardiopulmonary status limiting activity,Decreased activity tolerance,Decreased balance,Decreased mobility,Difficulty walking,Decreased strength  Visit Diagnosis: Difficulty in walking, not elsewhere classified  Repeated falls     Problem List Patient Active Problem List   Diagnosis Date Noted  . Confusion 04/03/2018  . Low back pain 07/08/2013  . Lumbar spondylosis 07/08/2013  . OA (osteoarthritis) of knee 02/11/2013  . Acute medial meniscus tear of left knee  01/16/2013  . Anxiety state 03/18/2009  . DEPRESSION 03/18/2009  . GERD 03/18/2009  . CONSTIPATION 03/18/2009  . FATTY LIVER DISEASE 03/18/2009  . BILIARY COLIC 077/93/9030 . PANCREATITIS 03/18/2009  . UTI 03/18/2009    4:58 PM, 10/15/20 MJerene Pitch DPT [facility] [phone]  [place] [address]Phone: [phone]  Fax:  [phone] Name: Phyllis HICKLINGMRN: [id number] of Birth: 1June 07, 1957

## 2020-10-20 ENCOUNTER — Other Ambulatory Visit: Payer: Self-pay

## 2020-10-20 ENCOUNTER — Encounter (HOSPITAL_COMMUNITY): Payer: Self-pay

## 2020-10-20 ENCOUNTER — Ambulatory Visit (HOSPITAL_COMMUNITY): Payer: Medicare Other

## 2020-10-20 DIAGNOSIS — R296 Repeated falls: Secondary | ICD-10-CM | POA: Diagnosis not present

## 2020-10-20 DIAGNOSIS — R262 Difficulty in walking, not elsewhere classified: Secondary | ICD-10-CM

## 2020-10-20 NOTE — Therapy (Signed)
[facility] [address] [place], [place], [address] Phone: [phone]   Fax:  [phone]  Physical Therapy Treatment  Patient Details  Name: [name] MRN: [id number] Date of Birth: Mar 22, 1956 Referring Provider (PT): Phillips Odor MD   Encounter Date: 10/20/2020   PT End of Session - 10/20/20 1645    Visit Number 11    Number of Visits 12    Date for PT Re-Evaluation 10/23/20    Authorization Type Medicare Part A    Progress Note Due on Visit 12    PT Start Time 1613    PT Stop Time 1655    PT Time Calculation (min) 42 min    Equipment Utilized During Treatment Gait belt    Activity Tolerance Patient tolerated treatment well;Patient limited by fatigue    Behavior During Therapy [facility] for tasks assessed/performed           Past Medical History:  Diagnosis Date  . Acid reflux   . Asthma   . Falls frequently   . High cholesterol   . Migraines   . Pancreatitis, acute     Past Surgical History:  Procedure Laterality Date  . ABDOMINAL HYSTERECTOMY     Question abdominal  . CESAREAN SECTION    . FOOT SURGERY    . GALLBLADDER SURGERY      There were no vitals filed for this visit.   Subjective Assessment - 10/20/20 1633    Subjective Pt stated she is feeling good, no reports of pain or recent falls.  Reports she has increased confidence with less fear of falling.    Patient Stated Goals Walk by myself    Currently in Pain? No/denies                             [facility] Adult PT Treatment/Exercise - 10/20/20 0001      Ambulation/Gait   Ambulation Distance (Feet) 226 Feet    Assistive device Rolling walker    Stairs Yes    Stairs Assistance 5: Supervision;4: Min guard    Stair Management Technique One rail Right;Two rails;Alternating pattern    Number of Stairs 4    Height of Stairs 7   3RT     Exercises   Exercises Knee/Hip      Knee/Hip Exercises: Standing   Hip Flexion 10 reps     Hip Flexion Limitations alternating marching no UE A 6in step height (3x sets done along with music); last set was timed able to complete alternating steps in 18.36"    Other Standing Knee Exercises large step side stepping 2RT    Other Standing Knee Exercises monster steps forward and backwards 2RT; Dance with therapist               Balance Exercises - 10/20/20 0001      Balance Exercises: Standing   Step Over Hurdles / Cones 6in hurdle forward and lateral 2RT inside // bars initial 1 HHA then    Other Standing Exercises Comments power shift; dance revolution moves               PT Short Term Goals - 10/09/20 1627      PT SHORT TERM GOAL #1   Title Patient will be independent with initial HEP and self-management strategies to improve functional outcomes    Time 2    Period Weeks    Status Partially Met  Target Date 09/25/20             PT Long Term Goals - 10/09/20 1628      PT LONG TERM GOAL #1   Title Patient will be able to perform stand x 5 in < 15 seconds to demonstrate improvement in functional mobility and reduced risk for falls.    Time 4    Period Weeks    Status Not Met      PT LONG TERM GOAL #2   Title Patient will be able to ambulate at least 275 feet during 2MWT with LRAD to demonstrate improved ability to perform functional mobility and associated tasks.    Time 4    Period Weeks    Status Achieved      PT LONG TERM GOAL #3   Title Patient will improve DGI by at least 5 points to indicate improved balance, improved functional outcomes and decreased risk for falls.    Time 4    Period Weeks    Status Not Met                 Plan - 10/20/20 1702    Clinical Impression Statement Continued standing balance and functional strengthening activities, pt tolerated well towards session.  Continued with music to time movements to the beat with improved stride length noted during dance.  Pt continues to required seated rest breaks due to  fatigue thorugh session.  Added reciprocal pattern with stairs, noted increased difficulty with Lt LE eccentric control, able to control wiht handrail safely.    Personal Factors and Comorbidities Comorbidity 2    Comorbidities parkinsons, asthma    Examination-Activity Limitations Locomotion Level;Stairs;Transfers    Examination-Participation Restrictions Yard Work;Community Activity;Cleaning    Stability/Clinical Decision Making Stable/Uncomplicated    Clinical Decision Making Low    Rehab Potential Fair    PT Frequency 2x / week    PT Duration 6 weeks    PT Treatment/Interventions ADLs/Self Care Home Management;Aquatic Therapy;Biofeedback;Cryotherapy;Canalith Repostioning;Electrical Stimulation;Iontophoresis 30m/ml Dexamethasone;Moist Heat;Traction;Balance training;Manual lymph drainage;Therapeutic exercise;Manual techniques;Vasopneumatic Device;Vestibular;Therapeutic activities;Splinting;Functional mobility training;Stair training;Orthotic Fit/Training;Taping;Energy conservation;Joint Manipulations;Dry needling;Passive range of motion;Spinal Manipulations;Visual/perceptual remediation/compensation;Compression bandaging;Scar mobilization;Cognitive remediation;Patient/family education;Neuromuscular re-education;Parrafin;Fluidtherapy;Contrast Bath;DME Instruction;Gait training;Ultrasound    PT Next Visit Plan Develop advanced HEP for DC to HEP next session.  Continue wiht BIG, POWER movements, gluteal strengthening and balance.    PT Home Exercise Plan 09/10/20: STS. heel raises, 2/4: trunk rotation, bridge, hip abduction           Patient will benefit from skilled therapeutic intervention in order to improve the following deficits and impairments:  Abnormal gait,Decreased endurance,Cardiopulmonary status limiting activity,Decreased activity tolerance,Decreased balance,Decreased mobility,Difficulty walking,Decreased strength  Visit Diagnosis: Repeated falls  Difficulty in walking, not  elsewhere classified     Problem List Patient Active Problem List   Diagnosis Date Noted  . Confusion 04/03/2018  . Low back pain 07/08/2013  . Lumbar spondylosis 07/08/2013  . OA (osteoarthritis) of knee 02/11/2013  . Acute medial meniscus tear of left knee 01/16/2013  . Anxiety state 03/18/2009  . DEPRESSION 03/18/2009  . GERD 03/18/2009  . CONSTIPATION 03/18/2009  . FATTY LIVER DISEASE 03/18/2009  . BILIARY COLIC 060/06/9322 . PANCREATITIS 03/18/2009  . UTI 03/18/2009   CIhor Austin LPTA/CLT; CBIS [phone] CAldona Lento2/15/2022, 5:09 PM  [place] [address]Phone: [phone]  Fax:  [phone] Name: Phyllis RADERMRN: [id number] of Birth: [birth year]

## 2020-10-21 ENCOUNTER — Ambulatory Visit (HOSPITAL_COMMUNITY): Payer: Medicare Other | Admitting: Physical Therapy

## 2020-10-21 ENCOUNTER — Encounter (HOSPITAL_COMMUNITY): Payer: Self-pay | Admitting: Physical Therapy

## 2020-10-21 DIAGNOSIS — R262 Difficulty in walking, not elsewhere classified: Secondary | ICD-10-CM | POA: Diagnosis not present

## 2020-10-21 DIAGNOSIS — R296 Repeated falls: Secondary | ICD-10-CM | POA: Diagnosis not present

## 2020-10-21 NOTE — Patient Instructions (Signed)
Date: 10/21/2020 Prepared by: Yetta Glassman  Exercises Supine Bridge - 3 sets - 10 reps Sit to Stand - 3 sets - 10 reps Heel rises with counter support - 3 sets - 10 reps Supine Lower Trunk Rotation - 3 sets - 10 reps Seated Trunk Rotation - Arms Crossed - 3 sets - 10 reps Standing Hip Abduction with Counter Support - 3 sets - 10 reps

## 2020-10-21 NOTE — Therapy (Signed)
[facility] [address] [place], [place], [address] Phone: [phone]   Fax:  [phone]  Physical Therapy Treatment and Discharge Note  Patient Details  Name: [name] MRN: [id number] Date of Birth: April 18, 1956 Referring Provider (PT): Phillips Odor MD   PHYSICAL THERAPY DISCHARGE SUMMARY  Visits from Start of Care: 12  Current functional level related to goals / functional outcomes: All goals met   Remaining deficits: None at this time   Education / Equipment: See below  Plan: Patient agrees to discharge.  Patient goals were met. Patient is being discharged due to meeting the stated rehab goals.  ?????           Encounter Date: 10/21/2020   PT End of Session - 10/21/20 1616    Visit Number 12    Number of Visits 12    Date for PT Re-Evaluation 10/23/20    Authorization Type Medicare Part A    Progress Note Due on Visit 12    PT Start Time 1615    PT Stop Time 1643    PT Time Calculation (min) 28 min    Equipment Utilized During Treatment Gait belt    Activity Tolerance Patient tolerated treatment well;Patient limited by fatigue    Behavior During Therapy WFL for tasks assessed/performed           Past Medical History:  Diagnosis Date  . Acid reflux   . Asthma   . Falls frequently   . High cholesterol   . Migraines   . Pancreatitis, acute     Past Surgical History:  Procedure Laterality Date  . ABDOMINAL HYSTERECTOMY     Question abdominal  . CESAREAN SECTION    . FOOT SURGERY    . GALLBLADDER SURGERY      There were no vitals filed for this visit.   Subjective Assessment - 10/21/20 1619    Subjective STates no falls since last session. States she feels really good with her mobility stating 100%better. States that she feels like she can walk better without assistance and has more energy. States that she is in pain from kidney infection and is seeing the MD tomorrow (not currently on  anitibiotics).    Patient Stated Goals Walk by myself    Currently in Pain? Yes    Pain Score 6     Pain Location Other (Comment)   back/kidney   Pain Descriptors / Indicators Aching    Pain Type Acute pain              [facility] PT Assessment - 10/21/20 0001      Assessment   Medical Diagnosis frequent falls    Referring Provider (PT) Phillips Odor MD      Transfers   Five time sit to stand comments  13.34 seconds   was 21 seconds     Ambulation/Gait   Ambulation/Gait Yes    Ambulation Distance (Feet) 344 Feet    Assistive device Rolling walker    Gait Comments 2MW      Dynamic Gait Index   Level Surface Normal    Change in Gait Speed Normal    Gait with Horizontal Head Turns Normal    Gait with Vertical Head Turns Normal    Gait and Pivot Turn Normal    Step Over Obstacle Mild Impairment    Step Around Obstacles Mild Impairment    Steps Moderate Impairment    Total Score 20    DGI  comment: --   initially was 47                                PT Education - 10/21/20 1645    Education Details on HEP, on progress made, on goals met and plan moving forward    Person(s) Educated Patient    Methods Explanation    Comprehension Verbalized understanding            PT Short Term Goals - 10/21/20 1622      PT SHORT TERM GOAL #1   Title Patient will be independent with initial HEP and self-management strategies to improve functional outcomes    Time 2    Period Weeks    Status Achieved    Target Date 09/25/20             PT Long Term Goals - 10/21/20 1622      PT LONG TERM GOAL #1   Title Patient will be able to perform stand x 5 in < 15 seconds to demonstrate improvement in functional mobility and reduced risk for falls.    Baseline 13.34 seconds    Time 4    Period Weeks    Status Achieved      PT LONG TERM GOAL #2   Title Patient will be able to ambulate at least 275 feet during 2MWT with LRAD to demonstrate improved ability to  perform functional mobility and associated tasks.    Baseline 344 feet RW    Time 4    Period Weeks    Status Achieved      PT LONG TERM GOAL #3   Title Patient will improve DGI by at least 5 points to indicate improved balance, improved functional outcomes and decreased risk for falls.    Time 4    Period Weeks    Status Achieved                 Plan - 10/21/20 1635    Clinical Impression Statement Patient has met all short and long term goals a this time and is confident in home exercises. Reviewed exercises and answered all questions prior to end of session. Printed off another copy for patient reference going forward. Patient to discharge from PT to HEP secondary to independence in HEP and progress made.    Personal Factors and Comorbidities Comorbidity 2    Comorbidities parkinsons, asthma    Examination-Activity Limitations Locomotion Level;Stairs;Transfers    Examination-Participation Restrictions Yard Work;Community Activity;Cleaning    Stability/Clinical Decision Making Stable/Uncomplicated    Rehab Potential Fair    PT Frequency 2x / week    PT Duration 6 weeks    PT Treatment/Interventions ADLs/Self Care Home Management;Aquatic Therapy;Biofeedback;Cryotherapy;Canalith Repostioning;Electrical Stimulation;Iontophoresis 1m/ml Dexamethasone;Moist Heat;Traction;Balance training;Manual lymph drainage;Therapeutic exercise;Manual techniques;Vasopneumatic Device;Vestibular;Therapeutic activities;Splinting;Functional mobility training;Stair training;Orthotic Fit/Training;Taping;Energy conservation;Joint Manipulations;Dry needling;Passive range of motion;Spinal Manipulations;Visual/perceptual remediation/compensation;Compression bandaging;Scar mobilization;Cognitive remediation;Patient/family education;Neuromuscular re-education;Parrafin;Fluidtherapy;Contrast Bath;DME Instruction;Gait training;Ultrasound    PT Next Visit [place]: STS. heel raises, 2/4: trunk rotation, bridge, hip abduction           Patient will benefit from skilled therapeutic intervention in order to improve the following deficits and impairments:  Abnormal gait,Decreased endurance,Cardiopulmonary status limiting activity,Decreased activity tolerance,Decreased balance,Decreased mobility,Difficulty walking,Decreased strength  Visit Diagnosis: Repeated falls  Difficulty in walking, not elsewhere classified     Problem List Patient Active  Problem List   Diagnosis Date Noted  . Confusion 04/03/2018  . Low back pain 07/08/2013  . Lumbar spondylosis 07/08/2013  . OA (osteoarthritis) of knee 02/11/2013  . Acute medial meniscus tear of left knee 01/16/2013  . Anxiety state 03/18/2009  . DEPRESSION 03/18/2009  . GERD 03/18/2009  . CONSTIPATION 03/18/2009  . FATTY LIVER DISEASE 03/18/2009  . BILIARY COLIC 84/85/9276  . PANCREATITIS 03/18/2009  . UTI 03/18/2009   4:48 PM, 10/21/20 Jerene Pitch, DPT Physical Therapy with [facility]  [phone] office  [place] [address] [place], [place], [address] Phone: [phone]   Fax:  [phone]  Name: LIZETH BENCOSME MRN: [id number] Date of Birth: [birth year]

## 2020-10-22 DIAGNOSIS — Z6830 Body mass index (BMI) 30.0-30.9, adult: Secondary | ICD-10-CM | POA: Diagnosis not present

## 2020-10-22 DIAGNOSIS — F419 Anxiety disorder, unspecified: Secondary | ICD-10-CM | POA: Diagnosis not present

## 2020-10-22 DIAGNOSIS — N342 Other urethritis: Secondary | ICD-10-CM | POA: Diagnosis not present

## 2020-10-22 DIAGNOSIS — Z1389 Encounter for screening for other disorder: Secondary | ICD-10-CM | POA: Diagnosis not present

## 2020-10-22 DIAGNOSIS — E6609 Other obesity due to excess calories: Secondary | ICD-10-CM | POA: Diagnosis not present

## 2020-10-22 DIAGNOSIS — C44329 Squamous cell carcinoma of skin of other parts of face: Secondary | ICD-10-CM | POA: Diagnosis not present

## 2020-11-12 ENCOUNTER — Ambulatory Visit: Payer: Medicare Other | Admitting: Orthopedic Surgery

## 2020-11-19 ENCOUNTER — Encounter: Payer: Self-pay | Admitting: Orthopedic Surgery

## 2020-11-19 ENCOUNTER — Ambulatory Visit (INDEPENDENT_AMBULATORY_CARE_PROVIDER_SITE_OTHER): Payer: Medicare Other | Admitting: Orthopedic Surgery

## 2020-11-19 ENCOUNTER — Other Ambulatory Visit: Payer: Self-pay

## 2020-11-19 VITALS — BP 158/99 | HR 80 | Ht 66.0 in | Wt 180.0 lb

## 2020-11-19 DIAGNOSIS — M65332 Trigger finger, left middle finger: Secondary | ICD-10-CM

## 2020-11-19 DIAGNOSIS — Z85828 Personal history of other malignant neoplasm of skin: Secondary | ICD-10-CM | POA: Diagnosis not present

## 2020-11-19 DIAGNOSIS — Z08 Encounter for follow-up examination after completed treatment for malignant neoplasm: Secondary | ICD-10-CM | POA: Diagnosis not present

## 2020-11-19 NOTE — Progress Notes (Signed)
Chief Complaint  Patient presents with  . Hand Problem    Left middle finger triggering x 32month     New problem for this 65 year old female patient well-known to this practice presents with acute pain and swelling of the left long finger present for about a month associated with difficulty bending her finger.  Pain is in the A1 pulley area over the palm of the long finger  Review of Systems  Constitutional: Negative for fever.  Respiratory: Negative for shortness of breath.   Cardiovascular: Negative for chest pain.  Skin: Negative.   Neurological: Negative for tingling and sensory change.   Past Medical History:  Diagnosis Date  . Acid reflux   . Asthma   . Falls frequently   . High cholesterol   . Migraines   . Pancreatitis, acute    BP (!) 158/99   Pulse 80   Ht 5\' 6"  (1.676 m)   Wt 180 lb (81.6 kg)   BMI 29.05 kg/m  Physical Exam Constitutional:      General: She is not in acute distress.    Appearance: She is well-developed.     Comments: Well developed, well nourished Normal grooming and hygiene     Cardiovascular:     Comments: No peripheral edema Musculoskeletal:     Comments: Left hand she can bend the digit but it does not bend all the way down she is tender over the A1 pulley of the long finger neurovascular exam is intact  Skin:    General: Skin is warm and dry.  Neurological:     Mental Status: She is alert and oriented to person, place, and time.     Sensory: No sensory deficit.     Coordination: Coordination normal.     Gait: Gait normal.     Deep Tendon Reflexes: Reflexes are normal and symmetric.  Psychiatric:        Mood and Affect: Mood normal.        Behavior: Behavior normal.        Thought Content: Thought content normal.        Judgment: Judgment normal.     Comments: Affect normal     Encounter Diagnosis  Name Primary?  . Trigger finger, left middle finger/long finger Yes   Patient consented for injection left long finger Timeout  completed  After cleaning the finger with alcohol and spraying ethyl chloride we injected 1:1 mixture of 6 Celestone and Sensorcaine   Follow-up in 2 weeks unless she is completely pain-free

## 2020-11-19 NOTE — Patient Instructions (Signed)
Trigger Finger  Trigger finger, also called stenosing tenosynovitis,  is a condition that causes a finger to get stuck in a bent position. Each finger has a tendon, which is a tough, cord-like tissue that connects muscle to bone, and each tendon passes through a tunnel of tissue called a tendon sheath. To move your finger, your tendon needs to glide freely through the sheath. Trigger finger happens when the tendon or the sheath thickens, making it difficult to move your finger. Trigger finger can affect any finger or a thumb. It may affect more than one finger. Mild cases may clear up with rest and medicine. Severe cases require more treatment. What are the causes? Trigger finger is caused by a thickened finger tendon or tendon sheath. The cause of this thickening is not known. What increases the risk? The following factors may make you more likely to develop this condition:  Doing activities that require a strong grip.  Having rheumatoid arthritis, gout, or diabetes.  Being 58-108 years old.  Being female. What are the signs or symptoms? Symptoms of this condition include:  Pain when bending or straightening your finger.  Tenderness or swelling where your finger attaches to the palm of your hand.  A lump in the palm of your hand or on the inside of your finger.  Hearing a noise like a pop or a snap when you try to straighten your finger.  Feeling a catching or locking sensation when you try to straighten your finger.  Being unable to straighten your finger. How is this diagnosed? This condition is diagnosed based on your symptoms and a physical exam. How is this treated? This condition may be treated by:  Resting your finger and avoiding activities that make symptoms worse.  Wearing a finger splint to keep your finger extended.  Taking NSAIDs, such as ibuprofen, to relieve pain and swelling.  Doing gentle exercises to stretch the finger as told by your health care  provider.  Having medicine that reduces swelling and inflammation (steroids) injected into the tendon sheath. Injections may need to be repeated.  Having surgery to open the tendon sheath. This may be done if other treatments do not work and you cannot straighten your finger. You may need physical therapy after surgery. Follow these instructions at home: If you have a splint:  Wear the splint as told by your health care provider. Remove it only as told by your health care provider.  Loosen it if your fingers tingle, become numb, or turn cold and blue.  Keep it clean.  If the splint is not waterproof: ? Do not let it get wet. ? Cover it with a watertight covering when you take a bath or shower. Managing pain, stiffness, and swelling If directed, apply heat to the affected area as often as told by your health care provider. Use the heat source that your health care provider recommends, such as a moist heat pack or a heating pad.  Place a towel between your skin and the heat source.  Leave the heat on for 20-30 minutes.  Remove the heat if your skin turns bright red. This is especially important if you are unable to feel pain, heat, or cold. You may have a greater risk of getting burned. If directed, put ice on the painful area. To do this:  If you have a removable splint, remove it as told by your health care provider.  Put ice in a plastic bag.  Place a towel between your skin  and the bag or between your splint and the bag.  Leave the ice on for 20 minutes, 2-3 times a day.      Activity  Rest your finger as told by your health care provider. Avoid activities that make the pain worse.  Return to your normal activities as told by your health care provider. Ask your health care provider what activities are safe for you.  Do exercises as told by your health care provider.  Ask your health care provider when it is safe to drive if you have a splint on your hand. General  instructions  Take over-the-counter and prescription medicines only as told by your health care provider.  Keep all follow-up visits as told by your health care provider. This is important. Contact a health care provider if:  Your symptoms are not improving with home care. Summary  Trigger finger, also called stenosing tenosynovitis, causes your finger to get stuck in a bent position. This can make it difficult and painful to straighten your finger.  This condition develops when a finger tendon or tendon sheath thickens.  Treatment may include resting your finger, wearing a splint, and taking medicines.  In severe cases, surgery to open the tendon sheath may be needed. This information is not intended to replace advice given to you by your health care provider. Make sure you discuss any questions you have with your health care provider. Document Revised: 01/07/2019 Document Reviewed: 01/07/2019 Elsevier Patient Education  [place].

## 2020-12-03 ENCOUNTER — Encounter: Payer: Self-pay | Admitting: Orthopedic Surgery

## 2020-12-03 ENCOUNTER — Ambulatory Visit (INDEPENDENT_AMBULATORY_CARE_PROVIDER_SITE_OTHER): Payer: Medicare Other | Admitting: Orthopedic Surgery

## 2020-12-03 ENCOUNTER — Other Ambulatory Visit: Payer: Self-pay

## 2020-12-03 VITALS — BP 125/70 | HR 114 | Ht 66.0 in | Wt 180.0 lb

## 2020-12-03 DIAGNOSIS — M65332 Trigger finger, left middle finger: Secondary | ICD-10-CM | POA: Diagnosis not present

## 2020-12-03 NOTE — Progress Notes (Signed)
Chief Complaint  Patient presents with  . Hand Pain    Left middle finger triggering, states injection helped a lot with the pain but finger is still locking.     65 year old female with chronic triggering of the left long finger recent injection on March 17 the pain is improved but the locking continues she would like another injection  Injection left long finger for triggering Celestone 6 mg 1 cc of lidocaine 1% patient gave consent Timeout confirmed injection site alcohol and ethyl chloride for skin prep no complications  Encounter Diagnosis  Name Primary?  . Trigger finger, left middle finger Yes

## 2020-12-03 NOTE — Patient Instructions (Signed)

## 2020-12-31 DIAGNOSIS — M150 Primary generalized (osteo)arthritis: Secondary | ICD-10-CM | POA: Diagnosis not present

## 2020-12-31 DIAGNOSIS — N390 Urinary tract infection, site not specified: Secondary | ICD-10-CM | POA: Diagnosis not present

## 2020-12-31 DIAGNOSIS — Z6830 Body mass index (BMI) 30.0-30.9, adult: Secondary | ICD-10-CM | POA: Diagnosis not present

## 2020-12-31 DIAGNOSIS — N342 Other urethritis: Secondary | ICD-10-CM | POA: Diagnosis not present

## 2021-01-04 ENCOUNTER — Ambulatory Visit: Payer: Medicare Other | Admitting: Orthopedic Surgery

## 2021-01-14 DIAGNOSIS — G894 Chronic pain syndrome: Secondary | ICD-10-CM | POA: Diagnosis not present

## 2021-01-14 DIAGNOSIS — Z6830 Body mass index (BMI) 30.0-30.9, adult: Secondary | ICD-10-CM | POA: Diagnosis not present

## 2021-01-14 DIAGNOSIS — E6609 Other obesity due to excess calories: Secondary | ICD-10-CM | POA: Diagnosis not present

## 2021-01-14 DIAGNOSIS — R8290 Unspecified abnormal findings in urine: Secondary | ICD-10-CM | POA: Diagnosis not present

## 2021-01-14 DIAGNOSIS — F419 Anxiety disorder, unspecified: Secondary | ICD-10-CM | POA: Diagnosis not present

## 2021-02-18 DIAGNOSIS — M1991 Primary osteoarthritis, unspecified site: Secondary | ICD-10-CM | POA: Diagnosis not present

## 2021-02-18 DIAGNOSIS — M62830 Muscle spasm of back: Secondary | ICD-10-CM | POA: Diagnosis not present

## 2021-02-18 DIAGNOSIS — E119 Type 2 diabetes mellitus without complications: Secondary | ICD-10-CM | POA: Diagnosis not present

## 2021-02-18 DIAGNOSIS — S29019A Strain of muscle and tendon of unspecified wall of thorax, initial encounter: Secondary | ICD-10-CM | POA: Diagnosis not present

## 2021-03-31 DIAGNOSIS — G2401 Drug induced subacute dyskinesia: Secondary | ICD-10-CM | POA: Diagnosis not present

## 2021-03-31 DIAGNOSIS — R2689 Other abnormalities of gait and mobility: Secondary | ICD-10-CM | POA: Diagnosis not present

## 2021-03-31 DIAGNOSIS — R296 Repeated falls: Secondary | ICD-10-CM | POA: Diagnosis not present

## 2021-03-31 DIAGNOSIS — T466X5A Adverse effect of antihyperlipidemic and antiarteriosclerotic drugs, initial encounter: Secondary | ICD-10-CM | POA: Diagnosis not present

## 2021-03-31 DIAGNOSIS — G2111 Neuroleptic induced parkinsonism: Secondary | ICD-10-CM | POA: Diagnosis not present

## 2021-03-31 DIAGNOSIS — Z79899 Other long term (current) drug therapy: Secondary | ICD-10-CM | POA: Diagnosis not present

## 2021-04-02 ENCOUNTER — Other Ambulatory Visit (HOSPITAL_COMMUNITY): Payer: Self-pay | Admitting: Family Medicine

## 2021-04-02 ENCOUNTER — Other Ambulatory Visit: Payer: Self-pay

## 2021-04-02 ENCOUNTER — Ambulatory Visit (HOSPITAL_COMMUNITY)
Admission: RE | Admit: 2021-04-02 | Discharge: 2021-04-02 | Disposition: A | Discharge status: Home / Self Care | Payer: Medicare Other | Source: Ambulatory Visit | Attending: Family Medicine | Admitting: Family Medicine

## 2021-04-02 DIAGNOSIS — E782 Mixed hyperlipidemia: Secondary | ICD-10-CM | POA: Diagnosis not present

## 2021-04-02 DIAGNOSIS — M79662 Pain in left lower leg: Secondary | ICD-10-CM

## 2021-04-02 DIAGNOSIS — Z0001 Encounter for general adult medical examination with abnormal findings: Secondary | ICD-10-CM | POA: Diagnosis not present

## 2021-04-02 DIAGNOSIS — E6609 Other obesity due to excess calories: Secondary | ICD-10-CM | POA: Diagnosis not present

## 2021-04-02 DIAGNOSIS — G894 Chronic pain syndrome: Secondary | ICD-10-CM | POA: Diagnosis not present

## 2021-04-02 DIAGNOSIS — E039 Hypothyroidism, unspecified: Secondary | ICD-10-CM | POA: Diagnosis not present

## 2021-04-02 DIAGNOSIS — D72829 Elevated white blood cell count, unspecified: Secondary | ICD-10-CM | POA: Diagnosis not present

## 2021-04-02 DIAGNOSIS — I5022 Chronic systolic (congestive) heart failure: Secondary | ICD-10-CM | POA: Diagnosis not present

## 2021-04-02 DIAGNOSIS — R7309 Other abnormal glucose: Secondary | ICD-10-CM | POA: Diagnosis not present

## 2021-04-02 DIAGNOSIS — J45909 Unspecified asthma, uncomplicated: Secondary | ICD-10-CM | POA: Diagnosis not present

## 2021-04-02 DIAGNOSIS — R600 Localized edema: Secondary | ICD-10-CM | POA: Diagnosis not present

## 2021-04-02 DIAGNOSIS — E538 Deficiency of other specified B group vitamins: Secondary | ICD-10-CM | POA: Diagnosis not present

## 2021-04-02 DIAGNOSIS — Z1331 Encounter for screening for depression: Secondary | ICD-10-CM | POA: Diagnosis not present

## 2021-04-02 DIAGNOSIS — Z1389 Encounter for screening for other disorder: Secondary | ICD-10-CM | POA: Diagnosis not present

## 2021-04-02 DIAGNOSIS — M79605 Pain in left leg: Secondary | ICD-10-CM | POA: Diagnosis not present

## 2021-04-02 DIAGNOSIS — Z6831 Body mass index (BMI) 31.0-31.9, adult: Secondary | ICD-10-CM | POA: Diagnosis not present

## 2021-05-06 ENCOUNTER — Other Ambulatory Visit: Payer: Self-pay

## 2021-05-06 ENCOUNTER — Other Ambulatory Visit (HOSPITAL_COMMUNITY): Payer: Self-pay | Admitting: Family Medicine

## 2021-05-06 ENCOUNTER — Ambulatory Visit (HOSPITAL_COMMUNITY)
Admission: RE | Admit: 2021-05-06 | Discharge: 2021-05-06 | Disposition: A | Discharge status: Home / Self Care | Payer: Medicare Other | Source: Ambulatory Visit | Attending: Family Medicine | Admitting: Family Medicine

## 2021-05-06 DIAGNOSIS — E6609 Other obesity due to excess calories: Secondary | ICD-10-CM | POA: Diagnosis not present

## 2021-05-06 DIAGNOSIS — M25572 Pain in left ankle and joints of left foot: Secondary | ICD-10-CM | POA: Insufficient documentation

## 2021-05-06 DIAGNOSIS — M79672 Pain in left foot: Secondary | ICD-10-CM

## 2021-05-06 DIAGNOSIS — M1991 Primary osteoarthritis, unspecified site: Secondary | ICD-10-CM | POA: Diagnosis not present

## 2021-05-06 DIAGNOSIS — Z6830 Body mass index (BMI) 30.0-30.9, adult: Secondary | ICD-10-CM | POA: Diagnosis not present

## 2021-05-20 ENCOUNTER — Encounter: Payer: Self-pay | Admitting: Orthopedic Surgery

## 2021-05-20 ENCOUNTER — Ambulatory Visit (INDEPENDENT_AMBULATORY_CARE_PROVIDER_SITE_OTHER): Payer: Medicare Other | Admitting: Orthopedic Surgery

## 2021-05-20 ENCOUNTER — Other Ambulatory Visit: Payer: Self-pay

## 2021-05-20 VITALS — BP 163/91 | HR 91 | Ht 66.0 in | Wt 183.0 lb

## 2021-05-20 DIAGNOSIS — Z681 Body mass index (BMI) 19 or less, adult: Secondary | ICD-10-CM | POA: Diagnosis not present

## 2021-05-20 DIAGNOSIS — J3089 Other allergic rhinitis: Secondary | ICD-10-CM | POA: Diagnosis not present

## 2021-05-20 DIAGNOSIS — M65332 Trigger finger, left middle finger: Secondary | ICD-10-CM | POA: Diagnosis not present

## 2021-05-20 NOTE — Progress Notes (Signed)
Chief Complaint  Patient presents with   Hand Pain    Lt hand middle finger pain for 1 month getting worse.     [name] 65 years old she has triggering of her left long finger or middle finger she received 1 injection and the pain is present she would not agree to surgical intervention and wants to have another injection  A steroid injection was performed at left hand A1 pulley long finger using 1% plain Lidocaine and 6 mg of Celestone. This was well tolerated.

## 2021-05-20 NOTE — Patient Instructions (Signed)

## 2021-06-17 DIAGNOSIS — E6609 Other obesity due to excess calories: Secondary | ICD-10-CM | POA: Diagnosis not present

## 2021-06-17 DIAGNOSIS — J329 Chronic sinusitis, unspecified: Secondary | ICD-10-CM | POA: Diagnosis not present

## 2021-06-17 DIAGNOSIS — N3000 Acute cystitis without hematuria: Secondary | ICD-10-CM | POA: Diagnosis not present

## 2021-06-17 DIAGNOSIS — E039 Hypothyroidism, unspecified: Secondary | ICD-10-CM | POA: Diagnosis not present

## 2021-06-17 DIAGNOSIS — Z6830 Body mass index (BMI) 30.0-30.9, adult: Secondary | ICD-10-CM | POA: Diagnosis not present

## 2021-06-17 DIAGNOSIS — Z23 Encounter for immunization: Secondary | ICD-10-CM | POA: Diagnosis not present

## 2021-08-19 DIAGNOSIS — G894 Chronic pain syndrome: Secondary | ICD-10-CM | POA: Diagnosis not present

## 2021-08-19 DIAGNOSIS — Z6830 Body mass index (BMI) 30.0-30.9, adult: Secondary | ICD-10-CM | POA: Diagnosis not present

## 2021-08-19 DIAGNOSIS — E6609 Other obesity due to excess calories: Secondary | ICD-10-CM | POA: Diagnosis not present

## 2021-08-19 DIAGNOSIS — G43009 Migraine without aura, not intractable, without status migrainosus: Secondary | ICD-10-CM | POA: Diagnosis not present

## 2021-09-15 DIAGNOSIS — G20 Parkinson's disease: Secondary | ICD-10-CM | POA: Diagnosis not present

## 2021-09-15 DIAGNOSIS — Z79899 Other long term (current) drug therapy: Secondary | ICD-10-CM | POA: Diagnosis not present

## 2021-09-15 DIAGNOSIS — R2689 Other abnormalities of gait and mobility: Secondary | ICD-10-CM | POA: Diagnosis not present

## 2021-09-15 DIAGNOSIS — G2401 Drug induced subacute dyskinesia: Secondary | ICD-10-CM | POA: Diagnosis not present

## 2021-09-15 DIAGNOSIS — G2111 Neuroleptic induced parkinsonism: Secondary | ICD-10-CM | POA: Diagnosis not present

## 2021-09-15 DIAGNOSIS — R296 Repeated falls: Secondary | ICD-10-CM | POA: Diagnosis not present

## 2021-09-15 DIAGNOSIS — T466X5A Adverse effect of antihyperlipidemic and antiarteriosclerotic drugs, initial encounter: Secondary | ICD-10-CM | POA: Diagnosis not present

## 2021-09-17 DIAGNOSIS — J069 Acute upper respiratory infection, unspecified: Secondary | ICD-10-CM | POA: Diagnosis not present

## 2021-09-17 DIAGNOSIS — J3089 Other allergic rhinitis: Secondary | ICD-10-CM | POA: Diagnosis not present

## 2021-09-17 DIAGNOSIS — M778 Other enthesopathies, not elsewhere classified: Secondary | ICD-10-CM | POA: Diagnosis not present

## 2021-09-17 DIAGNOSIS — Z6830 Body mass index (BMI) 30.0-30.9, adult: Secondary | ICD-10-CM | POA: Diagnosis not present

## 2021-09-17 DIAGNOSIS — E6609 Other obesity due to excess calories: Secondary | ICD-10-CM | POA: Diagnosis not present

## 2021-10-25 ENCOUNTER — Ambulatory Visit (INDEPENDENT_AMBULATORY_CARE_PROVIDER_SITE_OTHER): Payer: Medicare Other | Admitting: Orthopedic Surgery

## 2021-10-25 ENCOUNTER — Other Ambulatory Visit: Payer: Self-pay

## 2021-10-25 DIAGNOSIS — M65332 Trigger finger, left middle finger: Secondary | ICD-10-CM | POA: Diagnosis not present

## 2021-10-25 DIAGNOSIS — Z01818 Encounter for other preprocedural examination: Secondary | ICD-10-CM

## 2021-10-25 NOTE — Patient Instructions (Signed)
Your surgery will be at [facility] by Dr Aline Brochure  The hospital will contact you with a preoperative appointment to discuss Anesthesia.  Please bring your medications with you for the appointment. They will tell you the arrival time and medication instructions when you have your preoperative evaluation. Do not wear nail polish the day of your surgery and if you take Phentermine you need to stop this medication ONE WEEK prior to your surgery.

## 2021-10-25 NOTE — Progress Notes (Signed)
Chief Complaint  Patient presents with   Hand Pain    LEFT long finger Pt wants an injection    66 year old female comes in for injection left long finger she would also like to have surgery.  We injected the left long finger and we will schedule with surgery 30 days or more after the injection  Trigger finger injection  Diagnosis tenosynovitis left long finger Procedure injection A1 pulley Medications lidocaine 1% 1 mL and Depo-Medrol 40 mg 1 mL Skin prep alcohol and ethyl chloride Verbal consent was obtained Timeout confirmed the injection site  After cleaning the skin with alcohol and anesthetizing the skin with ethyl chloride the A1 pulley was palpated and the injection was performed without complication

## 2021-11-09 DIAGNOSIS — J329 Chronic sinusitis, unspecified: Secondary | ICD-10-CM | POA: Diagnosis not present

## 2021-11-17 ENCOUNTER — Other Ambulatory Visit: Payer: Self-pay

## 2021-11-17 DIAGNOSIS — M65332 Trigger finger, left middle finger: Secondary | ICD-10-CM

## 2021-11-29 DIAGNOSIS — Z1231 Encounter for screening mammogram for malignant neoplasm of breast: Secondary | ICD-10-CM | POA: Diagnosis not present

## 2021-11-30 NOTE — Patient Instructions (Signed)
? Your procedure is scheduled on: 12/03/2021 ? Report to [facility] at  6:00   AM. ? Call this number if you have problems the morning of surgery: [phone] ? ? Remember: ? ? Do not Eat or Drink after midnight  ? ?      No Smoking the morning of surgery ? : ? Take these medicines the morning of surgery with A SIP OF WATER: xanax, zyrtec, celexa, celebrex, gabapentin, levothyroxine, and omeprazole       Zofran and/or Imitrex if needed ? ? Do not wear jewelry, make-up or nail polish. ? Do not wear lotions, powders, or perfumes. You may wear deodorant. ? Do not shave 48 hours prior to surgery. Men may shave face and neck. ? Do not bring valuables to the hospital. ? Contacts, dentures or bridgework may not be worn into surgery. ? Leave suitcase in the car. After surgery it may be brought to your room. ? For patients admitted to the hospital, checkout time is 11:00 AM the day of discharge. ? ? Patients discharged the day of surgery will not be allowed to drive home. ?  ? Special Instructions: Shower using CHG night before surgery and shower the day of surgery use CHG.  Use special wash - you have one bottle of CHG for all showers.  You should use approximately 1/2 of the bottle for each shower.  ?How to Use Chlorhexidine for Bathing ?Chlorhexidine gluconate (CHG) is a germ-killing (antiseptic) solution that is used to clean the skin. It can get rid of the bacteria that normally live on the skin and can keep them away for about 24 hours. To clean your skin with CHG, you may be given: ?A CHG solution to use in the shower or as part of a sponge bath. ?A prepackaged cloth that contains CHG. ?Cleaning your skin with CHG may help lower the risk for infection: ?While you are staying in the intensive care unit of the hospital. ?If you have a vascular access, such as a central line, to provide short-term or long-term access to your veins. ?If you have a catheter to drain urine from your bladder. ?If you are on a  ventilator. A ventilator is a machine that helps you breathe by moving air in and out of your lungs. ?After surgery. ?What are the risks? ?Risks of using CHG include: ?A skin reaction. ?Hearing loss, if CHG gets in your ears and you have a perforated eardrum. ?Eye injury, if CHG gets in your eyes and is not rinsed out. ?The CHG product catching fire. ?Make sure that you avoid smoking and flames after applying CHG to your skin. ?Do not use CHG: ?If you have a chlorhexidine allergy or have previously reacted to chlorhexidine. ?On babies younger than 87 months of age. ?How to use CHG solution ?Use CHG only as told by your health care provider, and follow the instructions on the label. ?Use the full amount of CHG as directed. Usually, this is one bottle. ?During a shower ?Follow these steps when using CHG solution during a shower (unless your health care provider gives you different instructions): ?Start the shower. ?Use your normal soap and shampoo to wash your face and hair. ?Turn off the shower or move out of the shower stream. ?Pour the CHG onto a clean washcloth. Do not use any type of brush or rough-edged sponge. ?Starting at your neck, lather your body down to your toes. Make sure you follow these instructions: ?If you will be  having surgery, pay special attention to the part of your body where you will be having surgery. Scrub this area for at least 1 minute. ?Do not use CHG on your head or face. If the solution gets into your ears or eyes, rinse them well with water. ?Avoid your genital area. ?Avoid any areas of skin that have broken skin, cuts, or scrapes. ?Scrub your back and under your arms. Make sure to wash skin folds. ?Let the lather sit on your skin for 1-2 minutes or as long as told by your health care provider. ?Thoroughly rinse your entire body in the shower. Make sure that all body creases and crevices are rinsed well. ?Dry off with a clean towel. Do not put any substances on your body afterward--such  as powder, lotion, or perfume--unless you are told to do so by your health care provider. Only use lotions that are recommended by the manufacturer. ?Put on clean clothes or pajamas. ?If it is the night before your surgery, sleep in clean sheets. ? ?During a sponge bath ?Follow these steps when using CHG solution during a sponge bath (unless your health care provider gives you different instructions): ?Use your normal soap and shampoo to wash your face and hair. ?Pour the CHG onto a clean washcloth. ?Starting at your neck, lather your body down to your toes. Make sure you follow these instructions: ?If you will be having surgery, pay special attention to the part of your body where you will be having surgery. Scrub this area for at least 1 minute. ?Do not use CHG on your head or face. If the solution gets into your ears or eyes, rinse them well with water. ?Avoid your genital area. ?Avoid any areas of skin that have broken skin, cuts, or scrapes. ?Scrub your back and under your arms. Make sure to wash skin folds. ?Let the lather sit on your skin for 1-2 minutes or as long as told by your health care provider. ?Using a different clean, wet washcloth, thoroughly rinse your entire body. Make sure that all body creases and crevices are rinsed well. ?Dry off with a clean towel. Do not put any substances on your body afterward--such as powder, lotion, or perfume--unless you are told to do so by your health care provider. Only use lotions that are recommended by the manufacturer. ?Put on clean clothes or pajamas. ?If it is the night before your surgery, sleep in clean sheets. ?How to use CHG prepackaged cloths ?Only use CHG cloths as told by your health care provider, and follow the instructions on the label. ?Use the CHG cloth on clean, dry skin. ?Do not use the CHG cloth on your head or face unless your health care provider tells you to. ?When washing with the CHG cloth: ?Avoid your genital area. ?Avoid any areas of skin  that have broken skin, cuts, or scrapes. ?Before surgery ?Follow these steps when using a CHG cloth to clean before surgery (unless your health care provider gives you different instructions): ?Using the CHG cloth, vigorously scrub the part of your body where you will be having surgery. Scrub using a back-and-forth motion for 3 minutes. The area on your body should be completely wet with CHG when you are done scrubbing. ?Do not rinse. Discard the cloth and let the area air-dry. Do not put any substances on the area afterward, such as powder, lotion, or perfume. ?Put on clean clothes or pajamas. ?If it is the night before your surgery, sleep in clean sheets. ? ?  For general bathing ?Follow these steps when using CHG cloths for general bathing (unless your health care provider gives you different instructions). ?Use a separate CHG cloth for each area of your body. Make sure you wash between any folds of skin and between your fingers and toes. Wash your body in the following order, switching to a new cloth after each step: ?The front of your neck, shoulders, and chest. ?Both of your arms, under your arms, and your hands. ?Your stomach and groin area, avoiding the genitals. ?Your right leg and foot. ?Your left leg and foot. ?The back of your neck, your back, and your buttocks. ?Do not rinse. Discard the cloth and let the area air-dry. Do not put any substances on your body afterward--such as powder, lotion, or perfume--unless you are told to do so by your health care provider. Only use lotions that are recommended by the manufacturer. ?Put on clean clothes or pajamas. ?Contact a health care provider if: ?Your skin gets irritated after scrubbing. ?You have questions about using your solution or cloth. ?You swallow any chlorhexidine. Call your local poison control center (1-847-020-2620 in the [place] ?Get help right away if: ?Your eyes itch badly, or they become very red or swollen. ?Your skin itches badly and is red or  swollen. ?Your hearing changes. ?You have trouble seeing. ?You have swelling or tingling in your mouth or throat. ?You have trouble breathing. ?These symptoms may represent a serious problem that is a

## 2021-12-01 ENCOUNTER — Encounter (HOSPITAL_COMMUNITY)
Admission: RE | Admit: 2021-12-01 | Discharge: 2021-12-01 | Disposition: A | Discharge status: Home / Self Care | Payer: Medicare Other | Source: Ambulatory Visit | Attending: Orthopedic Surgery | Admitting: Orthopedic Surgery

## 2021-12-01 ENCOUNTER — Encounter (HOSPITAL_COMMUNITY): Payer: Self-pay

## 2021-12-01 DIAGNOSIS — Z01812 Encounter for preprocedural laboratory examination: Secondary | ICD-10-CM | POA: Diagnosis not present

## 2021-12-01 DIAGNOSIS — M65332 Trigger finger, left middle finger: Secondary | ICD-10-CM | POA: Diagnosis not present

## 2021-12-01 DIAGNOSIS — Z01818 Encounter for other preprocedural examination: Secondary | ICD-10-CM

## 2021-12-01 HISTORY — DX: Hypothyroidism, unspecified: E03.9

## 2021-12-01 LAB — CBC WITH DIFFERENTIAL/PLATELET
Abs Immature Granulocytes: 0.03 10*3/uL (ref 0.00–0.07)
Basophils Absolute: 0.1 10*3/uL (ref 0.0–0.1)
Basophils Relative: 1 %
Eosinophils Absolute: 0.3 10*3/uL (ref 0.0–0.5)
Eosinophils Relative: 5 %
HCT: 43.5 % (ref 36.0–46.0)
Hemoglobin: 13.7 g/dL (ref 12.0–15.0)
Immature Granulocytes: 0 %
Lymphocytes Relative: 28 %
Lymphs Abs: 2 10*3/uL (ref 0.7–4.0)
MCH: 29.1 pg (ref 26.0–34.0)
MCHC: 31.5 g/dL (ref 30.0–36.0)
MCV: 92.6 fL (ref 80.0–100.0)
Monocytes Absolute: 0.3 10*3/uL (ref 0.1–1.0)
Monocytes Relative: 5 %
Neutro Abs: 4.2 10*3/uL (ref 1.7–7.7)
Neutrophils Relative %: 61 %
Platelets: 193 10*3/uL (ref 150–400)
RBC: 4.7 MIL/uL (ref 3.87–5.11)
RDW: 13.5 % (ref 11.5–15.5)
WBC: 7 10*3/uL (ref 4.0–10.5)
nRBC: 0 % (ref 0.0–0.2)

## 2021-12-01 LAB — BASIC METABOLIC PANEL
Anion gap: 5 (ref 5–15)
BUN: 11 mg/dL (ref 8–23)
CO2: 27 mmol/L (ref 22–32)
Calcium: 9.2 mg/dL (ref 8.9–10.3)
Chloride: 107 mmol/L (ref 98–111)
Creatinine, Ser: 0.82 mg/dL (ref 0.44–1.00)
GFR, Estimated: >60 mL/min (ref >60)
Glucose, Bld: 152 mg/dL — ABNORMAL HIGH (ref 70–99)
Potassium: 3.5 mmol/L (ref 3.5–5.1)
Sodium: 139 mmol/L (ref 135–145)

## 2021-12-02 NOTE — H&P (Signed)
?  Chief Complaint  ?Patient presents with  ? Hand Problem  ?    Left middle finger triggering x 22month  ?  ?  ?[name] a 66year old female who started having trouble with her left middle finger last year she has had several injections the last one about a month ago.  She decided to have surgery after several injections failed to control her triggering ?  ?Review of Systems  ?Constitutional: Negative for fever.  ?Respiratory: Negative for shortness of breath.   ?Cardiovascular: Negative for chest pain.  ?Skin: Negative.   ?Neurological: Negative for tingling and sensory change.  ?  ?    ?Past Medical History:  ?Diagnosis Date  ? Acid reflux    ? Asthma    ? Falls frequently    ? High cholesterol    ? Migraines    ? Pancreatitis, acute    ?  ?BP (!) 158/99   Pulse 80   Ht '5\' 6"'$  (1.676 m)   Wt 180 lb (81.6 kg)   BMI 29.05 kg/m?  ?Physical Exam ?Constitutional:   ?   General: She is not in acute distress. ?   Appearance: She is well-developed.  ?   Comments: Well developed, well nourished ?Normal grooming and hygiene  ? ?  ?Cardiovascular:  ?   Comments: No peripheral edema ?Musculoskeletal:  ?   Comments: Left hand she can bend the digit but it does not bend all the way down she is tender over the A1 pulley of the long finger neurovascular exam is intact  ?Skin: ?   General: Skin is warm and dry.  ?Neurological:  ?   Mental Status: She is alert and oriented to person, place, and time.  ?   Sensory: No sensory deficit.  ?   Coordination: Coordination normal.  ?   Gait: Gait normal.  ?   Deep Tendon Reflexes: Reflexes are normal and symmetric.  ?Psychiatric:     ?   Mood and Affect: Mood normal.     ?   Behavior: Behavior normal.     ?   Thought Content: Thought content normal.     ?   Judgment: Judgment normal.  ?   Comments: Affect normal   ?  ?  ?    ?Encounter Diagnosis  ?Name Primary?  ? Trigger finger, left middle finger/long finger Yes  ? ? ?Procedure release A1 pulley left middle/long finger ? ?The  procedure has been fully reviewed with the patient; The risks and benefits of surgery have been discussed and explained and understood. Alternative treatment has also been reviewed, questions were encouraged and answered. The postoperative plan is also been reviewed. ? ?

## 2021-12-03 ENCOUNTER — Ambulatory Visit (HOSPITAL_COMMUNITY)
Admission: RE | Admit: 2021-12-03 | Discharge: 2021-12-03 | Disposition: A | Discharge status: Home / Self Care | Payer: Medicare Other | Attending: Orthopedic Surgery | Admitting: Orthopedic Surgery

## 2021-12-03 ENCOUNTER — Ambulatory Visit (HOSPITAL_BASED_OUTPATIENT_CLINIC_OR_DEPARTMENT_OTHER): Payer: Medicare Other | Admitting: Certified Registered Nurse Anesthetist

## 2021-12-03 ENCOUNTER — Encounter (HOSPITAL_COMMUNITY): Payer: Self-pay | Admitting: Orthopedic Surgery

## 2021-12-03 ENCOUNTER — Other Ambulatory Visit: Payer: Self-pay

## 2021-12-03 ENCOUNTER — Encounter (HOSPITAL_COMMUNITY)
Admission: RE | Disposition: A | Discharge status: Home / Self Care | Payer: Self-pay | Source: Home / Self Care | Attending: Orthopedic Surgery

## 2021-12-03 ENCOUNTER — Ambulatory Visit (HOSPITAL_COMMUNITY): Payer: Medicare Other | Admitting: Certified Registered Nurse Anesthetist

## 2021-12-03 DIAGNOSIS — E039 Hypothyroidism, unspecified: Secondary | ICD-10-CM | POA: Insufficient documentation

## 2021-12-03 DIAGNOSIS — K219 Gastro-esophageal reflux disease without esophagitis: Secondary | ICD-10-CM | POA: Diagnosis not present

## 2021-12-03 DIAGNOSIS — M65332 Trigger finger, left middle finger: Secondary | ICD-10-CM

## 2021-12-03 DIAGNOSIS — F32A Depression, unspecified: Secondary | ICD-10-CM | POA: Diagnosis not present

## 2021-12-03 DIAGNOSIS — F419 Anxiety disorder, unspecified: Secondary | ICD-10-CM | POA: Diagnosis not present

## 2021-12-03 HISTORY — PX: TRIGGER FINGER RELEASE: SHX641

## 2021-12-03 SURGERY — RELEASE, A1 PULLEY, FOR TRIGGER FINGER
Anesthesia: General | Site: Finger | Laterality: Left

## 2021-12-03 MED ORDER — FENTANYL CITRATE (PF) 100 MCG/2ML IJ SOLN
INTRAMUSCULAR | Status: AC
  Filled 2021-12-03: qty 2

## 2021-12-03 MED ORDER — SODIUM CHLORIDE 0.9 % IR SOLN
Status: DC | PRN
Start: 2021-12-03 — End: 2021-12-03
  Administered 2021-12-03: 1000 mL

## 2021-12-03 MED ORDER — ONDANSETRON HCL 4 MG/2ML IJ SOLN: 4.0000 mg | Freq: Once | INTRAMUSCULAR | Status: DC | PRN

## 2021-12-03 MED ORDER — VANCOMYCIN HCL IN DEXTROSE 1-5 GM/200ML-% IV SOLN
1000.0000 mg | INTRAVENOUS | Status: AC
  Administered 2021-12-03: 1000 mg via INTRAVENOUS
  Filled 2021-12-03: qty 200

## 2021-12-03 MED ORDER — ORAL CARE MOUTH RINSE: 15.0000 mL | Freq: Once | OROMUCOSAL | Status: AC

## 2021-12-03 MED ORDER — MIDAZOLAM HCL 2 MG/2ML IJ SOLN
INTRAMUSCULAR | Status: AC
  Filled 2021-12-03: qty 2

## 2021-12-03 MED ORDER — IBUPROFEN 800 MG PO TABS: 800.0000 mg | ORAL_TABLET | Freq: Three times a day (TID) | ORAL | 1 refills | Status: DC | PRN

## 2021-12-03 MED ORDER — PROPOFOL 10 MG/ML IV BOLUS
INTRAVENOUS | Status: DC | PRN
Start: 2021-12-03 — End: 2021-12-03
  Administered 2021-12-03: 20 mg via INTRAVENOUS

## 2021-12-03 MED ORDER — FENTANYL CITRATE (PF) 100 MCG/2ML IJ SOLN
INTRAMUSCULAR | Status: DC | PRN
  Administered 2021-12-03 (×2): 25 ug via INTRAVENOUS

## 2021-12-03 MED ORDER — PROPOFOL 500 MG/50ML IV EMUL
INTRAVENOUS | Status: DC | PRN
  Administered 2021-12-03: 50 ug/kg/min via INTRAVENOUS

## 2021-12-03 MED ORDER — LACTATED RINGERS IV SOLN: INTRAVENOUS | Status: DC

## 2021-12-03 MED ORDER — MIDAZOLAM HCL 2 MG/2ML IJ SOLN
INTRAMUSCULAR | Status: DC | PRN
  Administered 2021-12-03: 2 mg via INTRAVENOUS

## 2021-12-03 MED ORDER — PROPOFOL 10 MG/ML IV BOLUS
INTRAVENOUS | Status: AC
  Filled 2021-12-03: qty 20

## 2021-12-03 MED ORDER — BUPIVACAINE HCL (PF) 0.5 % IJ SOLN
INTRAMUSCULAR | Status: DC | PRN
  Administered 2021-12-03: 8 mL

## 2021-12-03 MED ORDER — FENTANYL CITRATE PF 50 MCG/ML IJ SOSY: 25.0000 ug | PREFILLED_SYRINGE | INTRAMUSCULAR | Status: DC | PRN

## 2021-12-03 MED ORDER — BUPIVACAINE HCL (PF) 0.5 % IJ SOLN
INTRAMUSCULAR | Status: AC
  Filled 2021-12-03: qty 30

## 2021-12-03 MED ORDER — CHLORHEXIDINE GLUCONATE 0.12 % MT SOLN
15.0000 mL | Freq: Once | OROMUCOSAL | Status: AC
  Administered 2021-12-03: 15 mL via OROMUCOSAL

## 2021-12-03 SURGICAL SUPPLY — 36 items
BANDAGE ESMARK 4X12 BL STRL LF (DISPOSABLE) ×1 IMPLANT
BLADE SURG 15 STRL LF DISP TIS (BLADE) ×1 IMPLANT
BLADE SURG 15 STRL SS (BLADE) ×2
BNDG COHESIVE 4X5 TAN STRL (GAUZE/BANDAGES/DRESSINGS) ×2 IMPLANT
BNDG CONFORM 2 STRL LF (GAUZE/BANDAGES/DRESSINGS) ×2 IMPLANT
BNDG ELASTIC 2X5.8 VLCR NS LF (GAUZE/BANDAGES/DRESSINGS) ×2 IMPLANT
BNDG ESMARK 4X12 BLUE STRL LF (DISPOSABLE) ×2
CHLORAPREP W/TINT 26 (MISCELLANEOUS) ×3 IMPLANT
CLOTH BEACON ORANGE TIMEOUT ST (SAFETY) ×2 IMPLANT
COVER LIGHT HANDLE STERIS (MISCELLANEOUS) ×4 IMPLANT
CUFF TOURN SGL QUICK 18X4 (TOURNIQUET CUFF) ×2 IMPLANT
DRAPE HALF SHEET 40X57 (DRAPES) ×2 IMPLANT
ELECT NDL TIP 2.8 STRL (NEEDLE) ×1 IMPLANT
ELECT NEEDLE TIP 2.8 STRL (NEEDLE) ×2 IMPLANT
ELECT REM PT RETURN 9FT ADLT (ELECTROSURGICAL) ×2
ELECTRODE REM PT RTRN 9FT ADLT (ELECTROSURGICAL) ×1 IMPLANT
GAUZE 4X4 16PLY ~~LOC~~+RFID DBL (SPONGE) ×1 IMPLANT
GAUZE SPONGE 4X4 12PLY STRL (GAUZE/BANDAGES/DRESSINGS) ×2 IMPLANT
GAUZE XEROFORM 1X8 LF (GAUZE/BANDAGES/DRESSINGS) ×2 IMPLANT
GLOVE SS N UNI LF 8.5 STRL (GLOVE) ×2 IMPLANT
GLOVE SURG POLYISO LF SZ8 (GLOVE) ×2 IMPLANT
GLOVE SURG UNDER POLY LF SZ7 (GLOVE) ×4 IMPLANT
GOWN STRL REUS W/TWL LRG LVL3 (GOWN DISPOSABLE) ×2 IMPLANT
GOWN STRL REUS W/TWL XL LVL3 (GOWN DISPOSABLE) ×2 IMPLANT
KIT TURNOVER KIT A (KITS) ×2 IMPLANT
MANIFOLD NEPTUNE II (INSTRUMENTS) ×2 IMPLANT
NDL HYPO 21X1.5 SAFETY (NEEDLE) ×1 IMPLANT
NEEDLE HYPO 21X1.5 SAFETY (NEEDLE) ×2 IMPLANT
NS IRRIG 1000ML POUR BTL (IV SOLUTION) ×2 IMPLANT
PACK BASIC LIMB (CUSTOM PROCEDURE TRAY) ×2 IMPLANT
PAD ARMBOARD 7.5X6 YLW CONV (MISCELLANEOUS) ×2 IMPLANT
POSITIONER HAND ALUMI XLG (MISCELLANEOUS) ×2 IMPLANT
SET BASIN LINEN APH (SET/KITS/TRAYS/PACK) ×2 IMPLANT
SPONGE GAUZE 2X2 8PLY STRL LF (GAUZE/BANDAGES/DRESSINGS) ×1 IMPLANT
SUT ETHILON 3 0 FSL (SUTURE) ×2 IMPLANT
SYR CONTROL 10ML LL (SYRINGE) ×2 IMPLANT

## 2021-12-03 NOTE — Op Note (Signed)
12/03/2021 ? ?8:02 AM ? ?PATIENT:  [name]  66 y.o. female ? ?PRE-OPERATIVE DIAGNOSIS:  trigger finger left long finger ? ?POST-OPERATIVE DIAGNOSIS:  trigger finger left long finger ? ?PROCEDURE:  Procedure(s): ?RELEASE TRIGGER FINGER/A-1 PULLEY left long finger (Left) ? ?Findings stenosing tenosynovitis of the left long finger no ganglions and the tendon had some areas of hourglass type appearance from the stenosing tenosynovitis ? ?Procedure was done as follows ? ?The patient was evaluated in the preop area cleared for surgery the surgical site was confirmed marked and chart review was completed ? ?Patient taken the operating room she got vancomycin 1000 mg she was placed in the supine position given IV sedation after sterile prep and drape and timeout she was given 5 cc of half percent plain Marcaine which was allowed to set up over 2 to 3 minutes.  The hand was elevated and exsanguinated with an Esmarch the tourniquet was elevated to 220 mmHg ? ?I used a longitudinal incision through the skin down to the tendon I encountered the previous residual of the injection of cortisone I protected the neurovascular bundles on each side of the tendon I took a Soil scientist and went under the A1 pulley and then divided it.  I flexed and stented the finger found some hourglass appearance to the tendon but there was no space-occupying lesion ? ?I irrigated the wound and closed with 3, 3-0 nylon sutures in interrupted fashion and injected another 3 cc of plain Marcaine ? ?Sterile dressing was applied with the tourniquet down.  Compression was held over the wound to help control bleeding ? ?And the patient was taken to recovery in stable condition ? ?The sutures can come out in 14 days ? ?SURGEON:  Surgeon(s) and Role: ?   Carole Civil, MD - Primary ? ?PHYSICIAN ASSISTANT:  ? ?ASSISTANTS: none  ? ?ANESTHESIA:   IV sedation and half percent plain Marcaine ? ?EBL:  0 mL  ? ?BLOOD ADMINISTERED:none ? ?DRAINS:  none  ? ?LOCAL MEDICATIONS USED:  MARCAINE    ? ?SPECIMEN:  No Specimen ? ?DISPOSITION OF SPECIMEN:  N/A ? ?COUNTS:  YES ? ?TOURNIQUET:   ?Total Tourniquet Time Documented: ?Upper Arm (Left) - 9 minutes ?Total: Upper Arm (Left) - 9 minutes ? ? ?DICTATION: .Dragon Dictation ? ?PLAN OF CARE: Discharge to home after PACU ? ?PATIENT DISPOSITION:  PACU - hemodynamically stable. ?  ?Delay start of Pharmacological VTE agent (>24hrs) due to surgical blood loss or risk of bleeding: not applicable ? ? ? ?

## 2021-12-03 NOTE — Anesthesia Postprocedure Evaluation (Signed)
Anesthesia Post Note ? ?Patient: [name] ? ?Procedure(s) Performed: RELEASE TRIGGER FINGER/A-1 PULLEY left long finger (Left: Finger) ? ?Patient location during evaluation: Phase II ?Anesthesia Type: General ?Level of consciousness: awake ?Pain management: pain level controlled ?Vital Signs Assessment: post-procedure vital signs reviewed and stable ?Respiratory status: spontaneous breathing and respiratory function stable ?Cardiovascular status: blood pressure returned to baseline and stable ?Postop Assessment: no headache and no apparent nausea or vomiting ?Anesthetic complications: no ?Comments: Late entry ? ? ?No notable events documented. ? ? ?Last Vitals:  ?Vitals:  ? 12/03/21 0659 12/03/21 0805  ?BP: 139/63 130/62  ?Pulse: 76 84  ?Resp: 18 14  ?Temp: 36.6 ?C 36.6 ?C  ?SpO2: 96% 98%  ?  ?Last Pain:  ?Vitals:  ? 12/03/21 0805  ?TempSrc: Oral  ?PainSc: 0-No pain  ? ? ?  ?  ?  ?  ?  ?  ? ?Louann Sjogren ? ? ? ? ?

## 2021-12-03 NOTE — Brief Op Note (Signed)
12/03/2021 ? ?8:02 AM ? ?PATIENT:  [name]  66 y.o. female ? ?PRE-OPERATIVE DIAGNOSIS:  trigger finger left long finger ? ?POST-OPERATIVE DIAGNOSIS:  trigger finger left long finger ? ?PROCEDURE:  Procedure(s): ?RELEASE TRIGGER FINGER/A-1 PULLEY left long finger (Left) ? ?Findings stenosing tenosynovitis of the left long finger no ganglions and the tendon had some areas of hourglass type appearance from the stenosing tenosynovitis ? ?Procedure was done as follows ? ?The patient was evaluated in the preop area cleared for surgery the surgical site was confirmed marked and chart review was completed ? ?Patient taken the operating room she got vancomycin 1000 mg she was placed in the supine position given IV sedation after sterile prep and drape and timeout she was given 5 cc of half percent plain Marcaine which was allowed to set up over 2 to 3 minutes.  The hand was elevated and exsanguinated with an Esmarch the tourniquet was elevated to 220 mmHg ? ?I used a longitudinal incision through the skin down to the tendon I encountered the previous residual of the injection of cortisone I protected the neurovascular bundles on each side of the tendon I took a Soil scientist and went under the A1 pulley and then divided it.  I flexed and stented the finger found some hourglass appearance to the tendon but there was no space-occupying lesion ? ?I irrigated the wound and closed with 3, 3-0 nylon sutures in interrupted fashion and injected another 3 cc of plain Marcaine ? ?Sterile dressing was applied with the tourniquet down.  Compression was held over the wound to help control bleeding ? ?And the patient was taken to recovery in stable condition ? ?The sutures can come out in 14 days ? ?SURGEON:  Surgeon(s) and Role: ?   Carole Civil, MD - Primary ? ?PHYSICIAN ASSISTANT:  ? ?ASSISTANTS: none  ? ?ANESTHESIA:   IV sedation and half percent plain Marcaine ? ?EBL:  0 mL  ? ?BLOOD ADMINISTERED:none ? ?DRAINS:  none  ? ?LOCAL MEDICATIONS USED:  MARCAINE    ? ?SPECIMEN:  No Specimen ? ?DISPOSITION OF SPECIMEN:  N/A ? ?COUNTS:  YES ? ?TOURNIQUET:   ?Total Tourniquet Time Documented: ?Upper Arm (Left) - 9 minutes ?Total: Upper Arm (Left) - 9 minutes ? ? ?DICTATION: .Dragon Dictation ? ?PLAN OF CARE: Discharge to home after PACU ? ?PATIENT DISPOSITION:  PACU - hemodynamically stable. ?  ?Delay start of Pharmacological VTE agent (>24hrs) due to surgical blood loss or risk of bleeding: not applicable ? ? ? ?

## 2021-12-03 NOTE — Interval H&P Note (Signed)
History and Physical Interval Note: ? ?12/03/2021 ?7:21 AM ? ?[name]  has presented today for surgery, with the diagnosis of trigger finger left long finger.  The various methods of treatment have been discussed with the patient and family. After consideration of risks, benefits and other options for treatment, the patient has consented to  Procedure(s): ?RELEASE TRIGGER FINGER/A-1 PULLEY left long finger (Left) as a surgical intervention.  The patient's history has been reviewed, patient examined, no change in status, stable for surgery.  I have reviewed the patient's chart and labs.  Questions were answered to the patient's satisfaction.   ? ? ?Arther Abbott ? ? ?

## 2021-12-03 NOTE — Interval H&P Note (Signed)
History and Physical Interval Note: ? ?12/03/2021 ?7:23 AM ? ?[name]  has presented today for surgery, with the diagnosis of trigger finger left long finger.  The various methods of treatment have been discussed with the patient and family. After consideration of risks, benefits and other options for treatment, the patient has consented to  Procedure(s): ?RELEASE TRIGGER FINGER/A-1 PULLEY left long finger (Left) as a surgical intervention.  The patient's history has been reviewed, patient examined, no change in status, stable for surgery.  I have reviewed the patient's chart and labs.  Questions were answered to the patient's satisfaction.   ? ? ?Arther Abbott ? ? ?

## 2021-12-03 NOTE — Anesthesia Preprocedure Evaluation (Signed)
Anesthesia Evaluation  ?Patient identified by MRN, date of birth, ID band ?Patient awake ? ? ? ?Reviewed: ?Allergy & Precautions, H&P , NPO status , Patient's Chart, lab work & pertinent test results, reviewed documented beta blocker date and time  ? ?Airway ?Mallampati: II ? ?TM Distance: >3 FB ?Neck ROM: full ? ? ? Dental ?no notable dental hx. ? ?  ?Pulmonary ?asthma ,  ?  ?Pulmonary exam normal ?breath sounds clear to auscultation ? ? ? ? ? ? Cardiovascular ?Exercise Tolerance: Good ?negative cardio ROS ? ? ?Rhythm:regular Rate:Normal ? ? ?  ?Neuro/Psych ? Headaches, PSYCHIATRIC DISORDERS Anxiety Depression   ? GI/Hepatic ?Neg liver ROS, GERD  Medicated,  ?Endo/Other  ?Hypothyroidism  ? Renal/GU ?negative Renal ROS  ?negative genitourinary ?  ?Musculoskeletal ? ? Abdominal ?  ?Peds ? Hematology ?negative hematology ROS ?(+)   ?Anesthesia Other Findings ? ? Reproductive/Obstetrics ?negative OB ROS ? ?  ? ? ? ? ? ? ? ? ? ? ? ? ? ?  ?  ? ? ? ? ? ? ? ? ?Anesthesia Physical ?Anesthesia Plan ? ?ASA: 2 ? ?Anesthesia Plan: General  ? ?Post-op Pain Management:   ? ?Induction:  ? ?PONV Risk Score and Plan: Propofol infusion ? ?Airway Management Planned:  ? ?Additional Equipment:  ? ?Intra-op Plan:  ? ?Post-operative Plan:  ? ?Informed Consent: I have reviewed the patients History and Physical, chart, labs and discussed the procedure including the risks, benefits and alternatives for the proposed anesthesia with the patient or authorized representative who has indicated his/her understanding and acceptance.  ? ? ? ?Dental Advisory Given ? ?Plan Discussed with: CRNA ? ?Anesthesia Plan Comments:   ? ? ? ? ? ? ?Anesthesia Quick Evaluation ? ?

## 2021-12-03 NOTE — Transfer of Care (Signed)
Immediate Anesthesia Transfer of Care Note ? ?Patient: [name] ? ?Procedure(s) Performed: RELEASE TRIGGER FINGER/A-1 PULLEY left long finger (Left: Finger) ? ?Patient Location: Short Stay ? ?Anesthesia Type:General ? ?Level of Consciousness: awake, alert  and oriented ? ?Airway & Oxygen Therapy: Patient Spontanous Breathing ? ?Post-op Assessment: Report given to RN and Post -op Vital signs reviewed and stable ? ?Post vital signs: Reviewed and stable ? ?Last Vitals:  ?Vitals Value Taken Time  ?BP 130/62   ?Temp    ?Pulse    ?Resp    ?SpO2 94%   ? ? ?Last Pain:  ?Vitals:  ? 12/03/21 0659  ?TempSrc: Oral  ?PainSc: 5   ?   ? ?  ? ?Complications: No notable events documented. ?

## 2021-12-03 NOTE — Anesthesia Procedure Notes (Signed)
Date/Time: 12/03/2021 7:30 AM ?Performed by: Karna Dupes, CRNA ?Pre-anesthesia Checklist: Patient identified, Emergency Drugs available, Suction available and Patient being monitored ?Patient Re-evaluated:Patient Re-evaluated prior to induction ?Oxygen Delivery Method: Nasal cannula ?Induction Type: IV induction ?Placement Confirmation: positive ETCO2 ?Dental Injury: Teeth and Oropharynx as per pre-operative assessment  ? ? ? ? ?

## 2021-12-06 ENCOUNTER — Encounter (HOSPITAL_COMMUNITY): Payer: Self-pay | Admitting: Orthopedic Surgery

## 2021-12-06 ENCOUNTER — Telehealth: Payer: Self-pay | Admitting: Orthopedic Surgery

## 2021-12-06 NOTE — Telephone Encounter (Signed)
Just use tylenol

## 2021-12-06 NOTE — Telephone Encounter (Signed)
Patient advised to take Tylenol as needed with verbal understanding. She is in agreement with this treatment plan.  ?

## 2021-12-06 NOTE — Telephone Encounter (Signed)
Patient left message relaying that  she was prescribed the following medication after surgery Friday, 12/03/21:  ?ibuprofen (ADVIL) 800 MG tablet 90 tablet  ?  - states she cannot take ibuprofen ?

## 2021-12-15 ENCOUNTER — Ambulatory Visit (INDEPENDENT_AMBULATORY_CARE_PROVIDER_SITE_OTHER): Payer: Medicare Other | Admitting: Orthopedic Surgery

## 2021-12-15 DIAGNOSIS — M65332 Trigger finger, left middle finger: Secondary | ICD-10-CM

## 2021-12-15 DIAGNOSIS — Z4889 Encounter for other specified surgical aftercare: Secondary | ICD-10-CM

## 2021-12-15 NOTE — Progress Notes (Signed)
FOLLOW UP  ? ?Encounter Diagnoses  ?Name Primary?  ? Trigger finger, left middle finger Yes  ? Aftercare following surgery   ? ? ? ?Chief Complaint  ?Patient presents with  ? Routine Post Op  ?  LEFT trigger finger release ?DOS 12/03/21  ? ? ? ?Sutures were removed from the left long finger she has good flexion extension wound looks good patient will let us know when she wants to have the right hand done ?

## 2021-12-16 ENCOUNTER — Encounter: Payer: Medicare Other | Admitting: Orthopedic Surgery

## 2021-12-28 DIAGNOSIS — M1991 Primary osteoarthritis, unspecified site: Secondary | ICD-10-CM | POA: Diagnosis not present

## 2021-12-28 DIAGNOSIS — I5022 Chronic systolic (congestive) heart failure: Secondary | ICD-10-CM | POA: Diagnosis not present

## 2021-12-28 DIAGNOSIS — J329 Chronic sinusitis, unspecified: Secondary | ICD-10-CM | POA: Diagnosis not present

## 2021-12-28 DIAGNOSIS — E6609 Other obesity due to excess calories: Secondary | ICD-10-CM | POA: Diagnosis not present

## 2021-12-28 DIAGNOSIS — Z6830 Body mass index (BMI) 30.0-30.9, adult: Secondary | ICD-10-CM | POA: Diagnosis not present

## 2022-04-05 DIAGNOSIS — R2689 Other abnormalities of gait and mobility: Secondary | ICD-10-CM | POA: Diagnosis not present

## 2022-04-05 DIAGNOSIS — G2111 Neuroleptic induced parkinsonism: Secondary | ICD-10-CM | POA: Diagnosis not present

## 2022-04-05 DIAGNOSIS — G2401 Drug induced subacute dyskinesia: Secondary | ICD-10-CM | POA: Diagnosis not present

## 2022-04-05 DIAGNOSIS — R251 Tremor, unspecified: Secondary | ICD-10-CM | POA: Diagnosis not present

## 2022-04-14 ENCOUNTER — Other Ambulatory Visit (HOSPITAL_COMMUNITY): Payer: Self-pay | Admitting: Family Medicine

## 2022-04-14 DIAGNOSIS — Z6830 Body mass index (BMI) 30.0-30.9, adult: Secondary | ICD-10-CM | POA: Diagnosis not present

## 2022-04-14 DIAGNOSIS — E538 Deficiency of other specified B group vitamins: Secondary | ICD-10-CM | POA: Diagnosis not present

## 2022-04-14 DIAGNOSIS — Z0001 Encounter for general adult medical examination with abnormal findings: Secondary | ICD-10-CM | POA: Diagnosis not present

## 2022-04-14 DIAGNOSIS — J3089 Other allergic rhinitis: Secondary | ICD-10-CM | POA: Diagnosis not present

## 2022-04-14 DIAGNOSIS — E6609 Other obesity due to excess calories: Secondary | ICD-10-CM | POA: Diagnosis not present

## 2022-04-14 DIAGNOSIS — E039 Hypothyroidism, unspecified: Secondary | ICD-10-CM | POA: Diagnosis not present

## 2022-04-14 DIAGNOSIS — G20 Parkinson's disease: Secondary | ICD-10-CM | POA: Diagnosis not present

## 2022-04-14 DIAGNOSIS — Z1331 Encounter for screening for depression: Secondary | ICD-10-CM | POA: Diagnosis not present

## 2022-04-14 DIAGNOSIS — E782 Mixed hyperlipidemia: Secondary | ICD-10-CM | POA: Diagnosis not present

## 2022-04-14 DIAGNOSIS — E559 Vitamin D deficiency, unspecified: Secondary | ICD-10-CM | POA: Diagnosis not present

## 2022-04-14 DIAGNOSIS — E118 Type 2 diabetes mellitus with unspecified complications: Secondary | ICD-10-CM | POA: Diagnosis not present

## 2022-04-14 DIAGNOSIS — Z780 Asymptomatic menopausal state: Secondary | ICD-10-CM

## 2022-04-14 DIAGNOSIS — J329 Chronic sinusitis, unspecified: Secondary | ICD-10-CM | POA: Diagnosis not present

## 2022-04-20 ENCOUNTER — Ambulatory Visit (HOSPITAL_COMMUNITY)
Admission: RE | Admit: 2022-04-20 | Discharge: 2022-04-20 | Disposition: A | Discharge status: Home / Self Care | Payer: Medicare Other | Source: Ambulatory Visit | Attending: Family Medicine | Admitting: Family Medicine

## 2022-04-20 DIAGNOSIS — Z0001 Encounter for general adult medical examination with abnormal findings: Secondary | ICD-10-CM

## 2022-04-20 DIAGNOSIS — M810 Age-related osteoporosis without current pathological fracture: Secondary | ICD-10-CM | POA: Diagnosis not present

## 2022-04-20 DIAGNOSIS — Z780 Asymptomatic menopausal state: Secondary | ICD-10-CM

## 2022-05-03 DIAGNOSIS — Z1212 Encounter for screening for malignant neoplasm of rectum: Secondary | ICD-10-CM | POA: Diagnosis not present

## 2022-05-03 DIAGNOSIS — Z1211 Encounter for screening for malignant neoplasm of colon: Secondary | ICD-10-CM | POA: Diagnosis not present

## 2022-05-13 DIAGNOSIS — J329 Chronic sinusitis, unspecified: Secondary | ICD-10-CM | POA: Diagnosis not present

## 2022-05-13 DIAGNOSIS — R6889 Other general symptoms and signs: Secondary | ICD-10-CM | POA: Diagnosis not present

## 2022-05-13 DIAGNOSIS — Z6830 Body mass index (BMI) 30.0-30.9, adult: Secondary | ICD-10-CM | POA: Diagnosis not present

## 2022-05-13 DIAGNOSIS — E6609 Other obesity due to excess calories: Secondary | ICD-10-CM | POA: Diagnosis not present

## 2022-05-24 IMAGING — MR MR HEAD W/O CM
11 of 12 series · 40 of 48 positions shown · non-contrast
Comparison: 04/04/2018 and prior.

CLINICAL DATA: Weakness, bilateral arm and hand numbness, repeated
falls.

EXAM:
MRI HEAD WITHOUT CONTRAST
MRI CERVICAL SPINE WITHOUT CONTRAST
TECHNIQUE: Multiplanar, multiecho pulse sequences of the brain and surrounding
structures, and cervical spine, to include the craniocervical
junction and cervicothoracic junction, were obtained without
intravenous contrast.

[Series 9: DWI · axial · 4.0mm · 0.88mm/px · z∈[-20,+119]mm · 5 of 36 slices shown (1 of 6)]
[im 1/36]
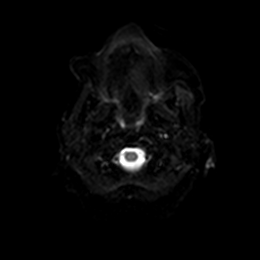
[im 9/36]
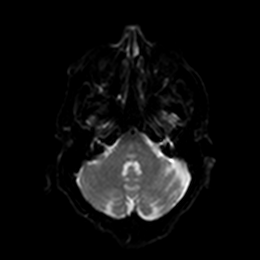
[im 18/36]
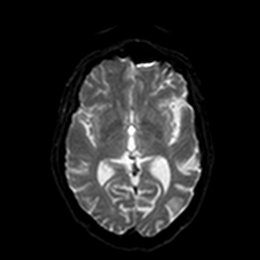
[im 27/36]
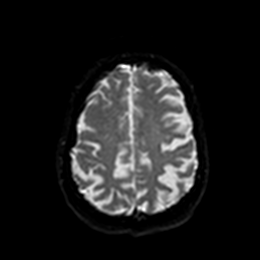
[im 36/36]
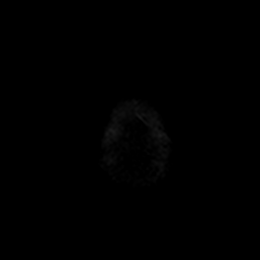

[Series 9: DWI · axial · 4.0mm · 0.88mm/px · z∈[-20,+119]mm · 5 of 36 slices shown (2 of 6)]
[im 1/36]
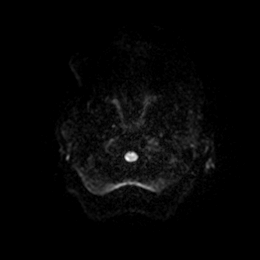
[im 9/36]
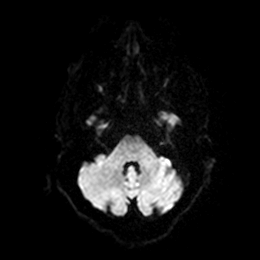
[im 18/36]
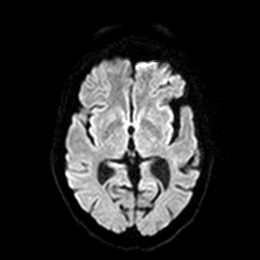
[im 27/36]
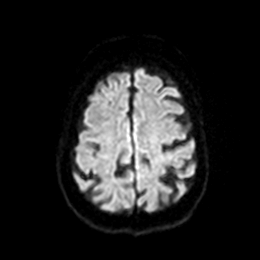
[im 36/36]
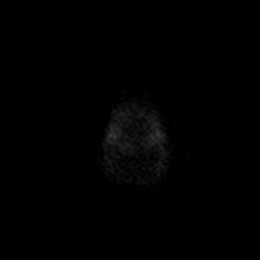

[Series 10: DWI · axial · 4.0mm · 0.88mm/px · z∈[-20,+119]mm · 5 of 36 slices shown (3 of 6)]
[im 1/36]
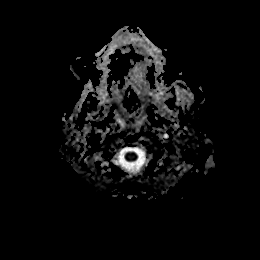
[im 9/36]
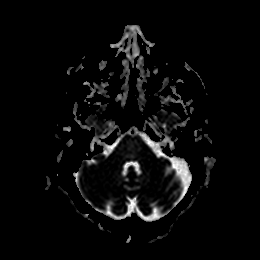
[im 18/36]
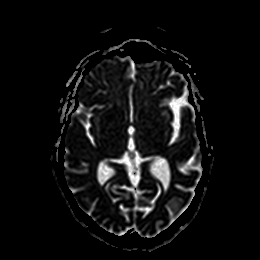
[im 27/36]
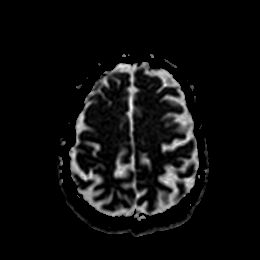
[im 36/36]
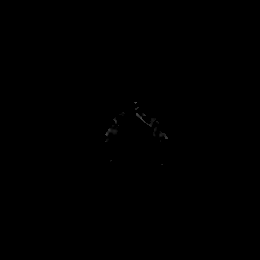

[Series 11: DWI · coronal · 5.0mm · 0.88mm/px · 3 of 26 slices shown (4 of 6)]
[im 1/26]
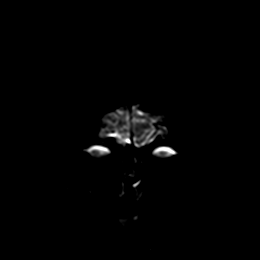
[im 13/26]
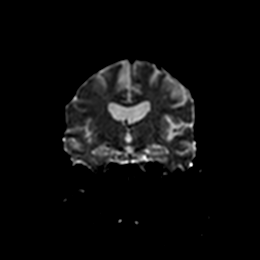
[im 26/26]
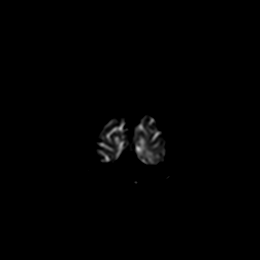

[Series 11: DWI · coronal · 5.0mm · 0.88mm/px · 3 of 26 slices shown (5 of 6)]
[im 1/26]
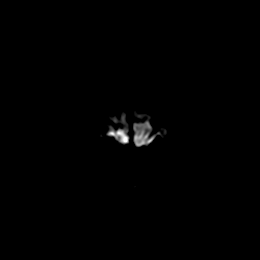
[im 13/26]
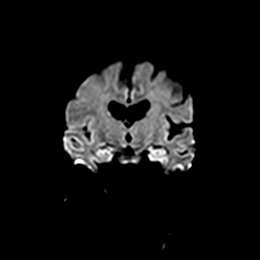
[im 26/26]
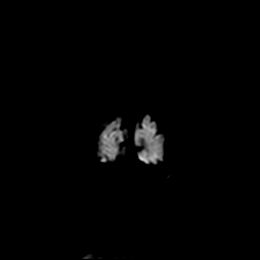

[Series 12: DWI · coronal · 5.0mm · 0.88mm/px · 3 of 26 slices shown (6 of 6)]
[im 1/26]
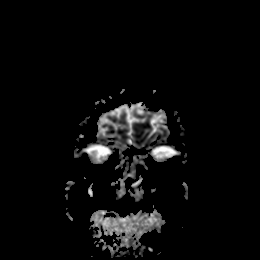
[im 13/26]
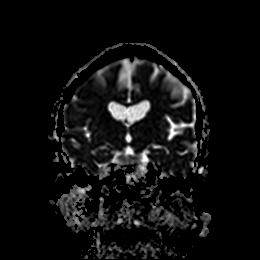
[im 26/26]
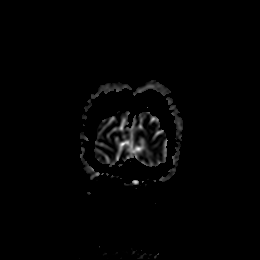

[Series 13: T1 · sagittal · 5.0mm · 0.94mm/px · 3 of 19 slices shown]
[im 1/19]
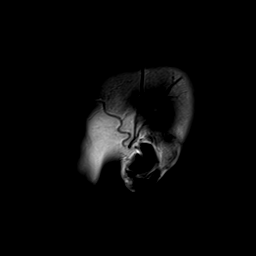
[im 10/19]
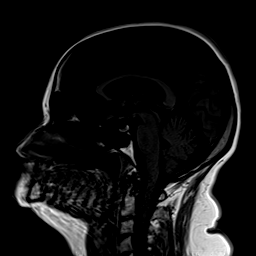
[im 19/19]
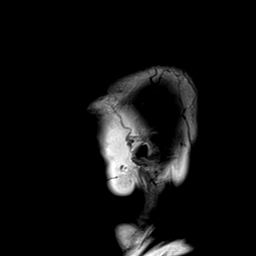

[Series 14: T2 · axial · 5.0mm · 0.72mm/px · z∈[-17,+115]mm · 3 of 20 slices shown (1 of 2)]
[im 1/20]
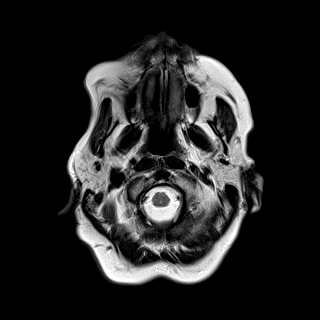
[im 10/20]
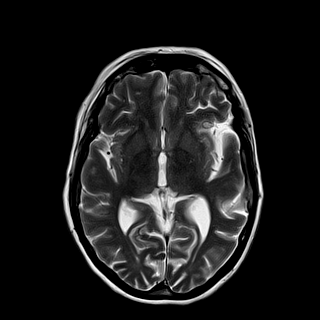
[im 20/20]
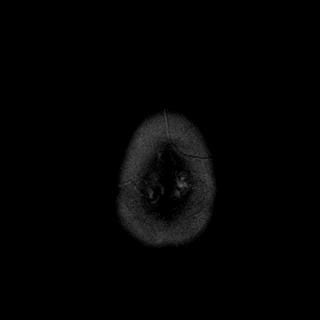

[Series 15: ax hemo · axial · 5.0mm · 0.86mm/px · z∈[-23,+120]mm · 3 of 25 slices shown]
[im 1/25]
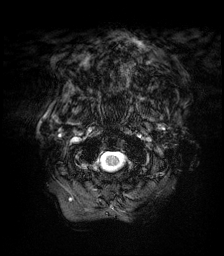
[im 13/25]
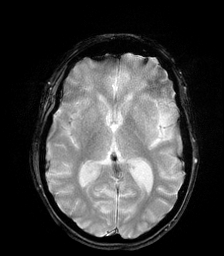
[im 25/25]
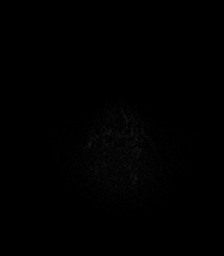

[Series 16: FLAIR · axial · 4.0mm · 0.43mm/px · z∈[-13,+110]mm · 4 of 32 slices shown]
[im 1/32]
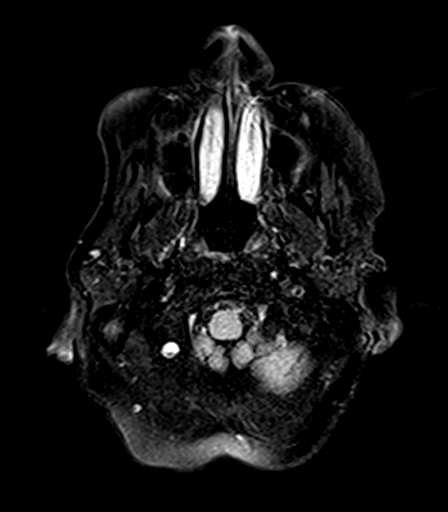
[im 11/32]
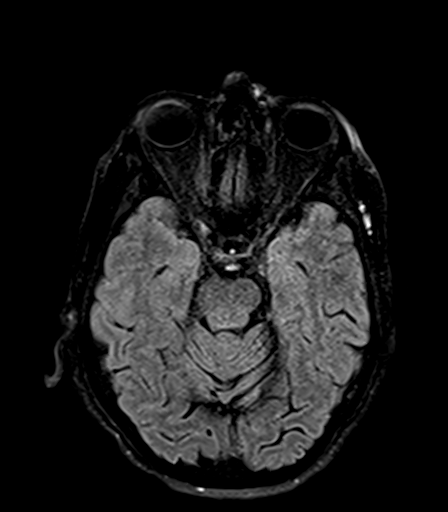
[im 21/32]
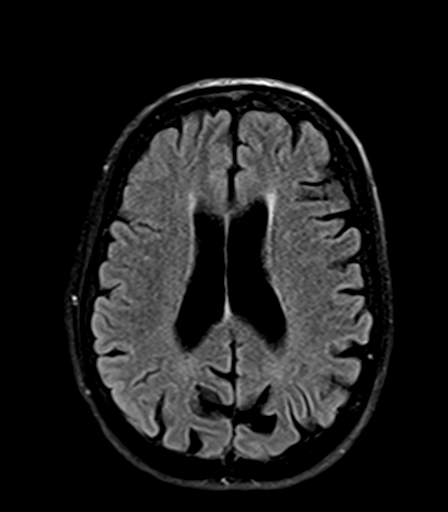
[im 32/32]
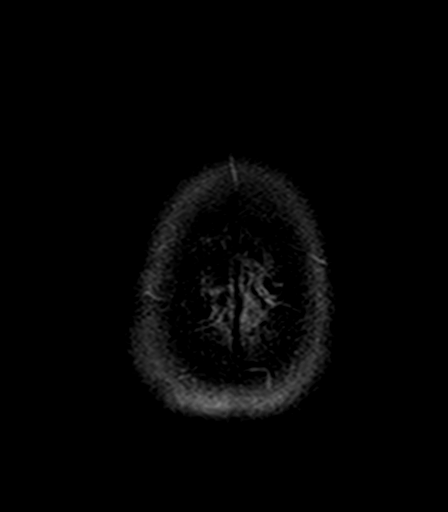

[Series 18: T2 · coronal · 5.0mm · 0.72mm/px · 3 of 22 slices shown (2 of 2)]
[im 1/22]
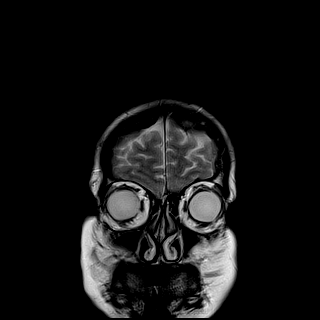
[im 11/22]
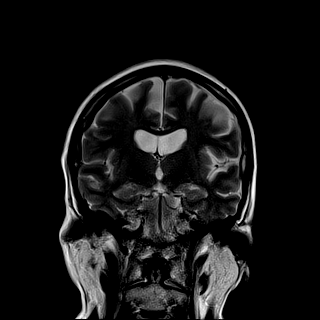
[im 22/22]
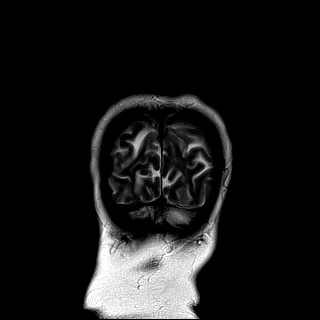

[40 of 48 positions shown; findings below may reference images not displayed]

FINDINGS: MRI HEAD FINDINGS

Brain: No diffusion-weighted signal abnormality. No intracranial
hemorrhage. No midline shift, ventriculomegaly or extra-axial fluid
collection. No mass lesion. Mild cerebral atrophy with ex vacuo
dilatation. Minimal chronic microvascular ischemic changes.

Vascular: Normal flow voids.

Skull and upper cervical spine: Normal marrow signal.

Sinuses/Orbits: Normal orbits. Clear paranasal sinuses. Trace right
mastoid effusion.

Other: None.

MRI CERVICAL SPINE FINDINGS

Alignment: Straightening of lordosis. Trace C5-6 anterolisthesis and
trace C6-7 retrolisthesis.

Vertebrae: No focal osseous lesion. Vertebral body heights are
preserved. Mild multilevel Modic type 2 endplate degenerative
changes.

Cord: Normal signal and morphology.

Posterior Fossa, vertebral arteries: Please see MRI head for
findings above the foramen magnum.

Disc levels: Multilevel desiccation.

C2-3: Small central protrusion. Bilateral uncovertebral and facet
degenerative spurring. Patent spinal canal and right neural foramen.
Mild left neural foraminal narrowing.

C3-4: Small disc osteophyte complex with superimposed left
paracentral protrusion. Uncovertebral and facet hypertrophy. Mild
spinal canal and bilateral neural foraminal narrowing.

C4-5: Disc osteophyte complex with uncovertebral and facet
hypertrophy. Mild spinal canal, mild right and moderate left neural
foraminal narrowing.

C5-6: Disc osteophyte complex with shallow left foraminal
protrusion. Uncovertebral and facet hypertrophy. Mild spinal canal
and bilateral neural foraminal narrowing.

C6-7: Disc osteophyte complex with left predominant uncovertebral
and facet hypertrophy. Mild spinal canal, mild right and severe left
neural foraminal narrowing.

C7-T1: No significant disc bulge. Patent spinal canal and neural
foramen.

Paraspinal tissues: Negative.
IMPRESSION: MRI head:

No acute intracranial process.

Mild cerebral atrophy and minimal chronic microvascular ischemic
changes.

MRI cervical spine:

Multilevel spondylosis. Moderate left C4-5 and severe left C6-7
neural foraminal narrowing.

Mild spinal canal narrowing at the C3-C7 levels.

Mild left C2-3, bilateral C3-4, right C4-5, bilateral C5-6 and right
C6-7 neural foraminal narrowing.

## 2022-05-31 ENCOUNTER — Encounter: Payer: Self-pay | Admitting: Internal Medicine

## 2022-06-21 NOTE — Progress Notes (Deleted)
GI Office Note    Referring Provider: Redmond School, MD Primary Care Physician:  Redmond School, MD  Primary Gastroenterologist:  Chief Complaint   No chief complaint on file.    History of Present Illness   [name] is a 66 y.o. female presenting today at the request of Dr. Gerarda Fraction for further evaluation of positive cologuard.         Medications   Current Outpatient Medications  Medication Sig Dispense Refill   acetaminophen (TYLENOL) 500 MG tablet Take 500 mg by mouth every 6 (six) hours as needed for moderate pain.     albuterol (PROVENTIL HFA;VENTOLIN HFA) 108 (90 Base) MCG/ACT inhaler Inhale 2 puffs into the lungs every 6 (six) hours as needed for wheezing or shortness of breath.     ALPRAZolam (XANAX) 1 MG tablet Take 1 tablet by mouth 3 (three) times daily as needed for anxiety.     amantadine (SYMMETREL) 100 MG capsule Take 100 mg by mouth 3 (three) times daily.     celecoxib (CELEBREX) 200 MG capsule Take 200 mg by mouth 2 (two) times daily as needed for moderate pain.     cetirizine (ZYRTEC) 10 MG tablet Take 10 mg by mouth daily.     Cholecalciferol (DIALYVITE VITAMIN D 5000) 125 MCG (5000 UT) capsule Take 5,000 Units by mouth daily.     citalopram (CELEXA) 40 MG tablet Take 1 tablet by mouth daily.     diclofenac Sodium (VOLTAREN) 1 % GEL Apply 1 application. topically 4 (four) times daily as needed for pain.     gabapentin (NEURONTIN) 400 MG capsule Take 1 capsule by mouth 3 (three) times daily.     ibuprofen (ADVIL) 800 MG tablet Take 1 tablet (800 mg total) by mouth every 8 (eight) hours as needed. 90 tablet 1   levothyroxine (SYNTHROID, LEVOTHROID) 25 MCG tablet Take 25 mcg by mouth daily before breakfast.     Melatonin 5 MG CAPS Take 5 mg by mouth at bedtime.     Multiple Vitamin (MULTIVITAMIN WITH MINERALS) TABS tablet Take 1 tablet by mouth daily.     omeprazole (PRILOSEC) 20 MG capsule Take 20 mg by mouth daily.     ondansetron (ZOFRAN)  4 MG tablet Take 4 mg by mouth every 6 (six) hours as needed for vomiting or nausea.     pravastatin (PRAVACHOL) 80 MG tablet Take 1 tablet by mouth every evening.      SUMAtriptan (IMITREX) 100 MG tablet Take 100 mg by mouth every 2 (two) hours as needed for migraine. May repeat in 2 hours if headache persists or recurs.     tiZANidine (ZANAFLEX) 4 MG tablet Take 4 mg by mouth every 8 (eight) hours as needed for muscle spasms.     No current facility-administered medications for this visit.    Allergies   Allergies as of 06/22/2022 - Review Complete 12/15/2021  Allergen Reaction Noted   Acetaminophen-codeine Other (See Comments) 07/24/2013   Diclofenac potassium(migraine) Other (See Comments) 06/27/2013   Sulfonamide derivatives     Tetracycline     Penicillins Rash     Past Medical History   Past Medical History:  Diagnosis Date   Acid reflux    Asthma    Falls frequently    High cholesterol    Hypothyroidism    Migraines    Pancreatitis, acute     Past Surgical History   Past Surgical History:  Procedure Laterality Date   ABDOMINAL HYSTERECTOMY  Question abdominal   CESAREAN SECTION     FOOT SURGERY     GALLBLADDER SURGERY     TRIGGER FINGER RELEASE Left 12/03/2021   Procedure: RELEASE TRIGGER FINGER/A-1 PULLEY left long finger;  Surgeon: Carole Civil, MD;  Location: [facility];  Service: Orthopedics;  Laterality: Left;    Past Family History   No family history on file.  Past Social History   Social History   Socioeconomic History   Marital status: Married    Spouse name: Not on file   Number of children: Not on file   Years of education: Not on file   Highest education level: Not on file  Occupational History   Not on file  Tobacco Use   Smoking status: Never   Smokeless tobacco: Never  Substance and Sexual Activity   Alcohol use: No   Drug use: No   Sexual activity: Not on file  Other Topics Concern   Not on file  Social History  Narrative   Not on file   Social Determinants of Health   Financial Resource Strain: Not on file  Food Insecurity: Not on file  Transportation Needs: Not on file  Physical Activity: Not on file  Stress: Not on file  Social Connections: Not on file  Intimate Partner Violence: Not on file    Review of Systems   General: Negative for anorexia, weight loss, fever, chills, fatigue, weakness. Eyes: Negative for vision changes.  ENT: Negative for hoarseness, difficulty swallowing , nasal congestion. CV: Negative for chest pain, angina, palpitations, dyspnea on exertion, peripheral edema.  Respiratory: Negative for dyspnea at rest, dyspnea on exertion, cough, sputum, wheezing.  GI: See history of present illness. GU:  Negative for dysuria, hematuria, urinary incontinence, urinary frequency, nocturnal urination.  MS: Negative for joint pain, low back pain.  Derm: Negative for rash or itching.  Neuro: Negative for weakness, abnormal sensation, seizure, frequent headaches, memory loss,  confusion.  Psych: Negative for anxiety, depression, suicidal ideation, hallucinations.  Endo: Negative for unusual weight change.  Heme: Negative for bruising or bleeding. Allergy: Negative for rash or hives.  Physical Exam   There were no vitals taken for this visit.   General: Well-nourished, well-developed in no acute distress.  Head: Normocephalic, atraumatic.   Eyes: Conjunctiva pink, no icterus. Mouth: Oropharyngeal mucosa moist and pink , no lesions erythema or exudate. Neck: Supple without thyromegaly, masses, or lymphadenopathy.  Lungs: Clear to auscultation bilaterally.  Heart: Regular rate and rhythm, no murmurs rubs or gallops.  Abdomen: Bowel sounds are normal, nontender, nondistended, no hepatosplenomegaly or masses,  no abdominal bruits or hernia, no rebound or guarding.   Rectal: *** Extremities: No lower extremity edema. No clubbing or deformities.  Neuro: Alert and oriented x 4 ,  grossly normal neurologically.  Skin: Warm and dry, no rash or jaundice.   Psych: Alert and cooperative, normal mood and affect.  Labs   *** Imaging Studies   No results found.  Assessment       PLAN   ***   Laureen Ochs. Bobby Rumpf, [place], [facility]

## 2022-06-22 ENCOUNTER — Ambulatory Visit: Payer: Medicare Other | Admitting: Gastroenterology

## 2022-06-23 DIAGNOSIS — M1991 Primary osteoarthritis, unspecified site: Secondary | ICD-10-CM | POA: Diagnosis not present

## 2022-06-23 DIAGNOSIS — N390 Urinary tract infection, site not specified: Secondary | ICD-10-CM | POA: Diagnosis not present

## 2022-06-23 DIAGNOSIS — G8929 Other chronic pain: Secondary | ICD-10-CM | POA: Diagnosis not present

## 2022-06-23 DIAGNOSIS — G894 Chronic pain syndrome: Secondary | ICD-10-CM | POA: Diagnosis not present

## 2022-06-23 DIAGNOSIS — E663 Overweight: Secondary | ICD-10-CM | POA: Diagnosis not present

## 2022-06-23 DIAGNOSIS — Z6830 Body mass index (BMI) 30.0-30.9, adult: Secondary | ICD-10-CM | POA: Diagnosis not present

## 2022-07-11 DIAGNOSIS — G259 Extrapyramidal and movement disorder, unspecified: Secondary | ICD-10-CM | POA: Diagnosis not present

## 2022-07-11 DIAGNOSIS — F411 Generalized anxiety disorder: Secondary | ICD-10-CM | POA: Diagnosis not present

## 2022-07-11 DIAGNOSIS — R251 Tremor, unspecified: Secondary | ICD-10-CM | POA: Diagnosis not present

## 2022-07-11 DIAGNOSIS — G43009 Migraine without aura, not intractable, without status migrainosus: Secondary | ICD-10-CM | POA: Diagnosis not present

## 2022-08-01 ENCOUNTER — Telehealth: Payer: Self-pay

## 2022-08-01 ENCOUNTER — Encounter: Payer: Self-pay | Admitting: Gastroenterology

## 2022-08-01 ENCOUNTER — Ambulatory Visit (INDEPENDENT_AMBULATORY_CARE_PROVIDER_SITE_OTHER): Payer: Medicare Other | Admitting: Gastroenterology

## 2022-08-01 VITALS — BP 163/100 | HR 76 | Temp 98.1°F | Ht 66.0 in | Wt 192.0 lb

## 2022-08-01 DIAGNOSIS — R195 Other fecal abnormalities: Secondary | ICD-10-CM

## 2022-08-01 DIAGNOSIS — K5900 Constipation, unspecified: Secondary | ICD-10-CM | POA: Diagnosis not present

## 2022-08-01 MED ORDER — LUBIPROSTONE 24 MCG PO CAPS: 24.0000 ug | ORAL_CAPSULE | Freq: Two times a day (BID) | ORAL | 5 refills | Status: DC

## 2022-08-01 NOTE — Telephone Encounter (Signed)
Pt called stating that the Amitiza is $145 and she can not afford it.

## 2022-08-01 NOTE — Telephone Encounter (Signed)
OK, Amitiza generic looked to be only one on her formularly.   Lets have her start miralax OTC 17 grams mixed in 4 ounces of liquid twice daily until she is having soft bowel movements, then continue once daily.   If not effective she needs to let us know.

## 2022-08-01 NOTE — Patient Instructions (Addendum)
Start amitiza 32mg once to twice daily with food for constipation. Goal of bowel movement 4-5 days per week, soft stool with no straining. Call if not effective or too strong.  Colonoscopy to be scheduled in near future.  Your blood pressure remains elevated, you should follow up with your PCP regarding your blood pressure.

## 2022-08-01 NOTE — Progress Notes (Signed)
GI Office Note    Referring Provider: Redmond School, MD Primary Care Physician:  Redmond School, MD  Primary Gastroenterologist: formerly Dr. Oneida Alar  Chief Complaint   Chief Complaint  Patient presents with   Colonoscopy    Positive cologuard, thinks it may be due to having to strain from constipation.      History of Present Illness   [name] is a 66 y.o. female presenting today at the request of Dr. Gerarda Fraction for further evaluation of positive Cologuard.   She reports remote colonoscopy about 30 years ago. Remote EGD. Denies significant findings. She has chronic constipation at baseline. Has to strain with most stools. Occasionally has brbpr. She takes fiber pills 3-4 per day but does not find effective. She reports BM once per week. Certain foods she has diarrhea, like [place] and stew. She has chronic lower abdominal pain. She is not able to determine modifying factors. She does not believe it is worse with meals. She has had it for over 30 years.  Heartburn controlled on omeprazole.  No nausea or vomiting.  Has trouble swallowing meat but she has no teeth or dentures.  No issues with swallowing any other foods or her medication.  No unintentional weight loss. Remote idiopathic pancreatitis 2009, denies any issues since then.   Medications   Current Outpatient Medications  Medication Sig Dispense Refill   acetaminophen (TYLENOL) 500 MG tablet Take 500 mg by mouth every 6 (six) hours as needed for moderate pain.     albuterol (PROVENTIL HFA;VENTOLIN HFA) 108 (90 Base) MCG/ACT inhaler Inhale 2 puffs into the lungs every 6 (six) hours as needed for wheezing or shortness of breath.     ALPRAZolam (XANAX) 1 MG tablet Take 1 tablet by mouth 3 (three) times daily as needed for anxiety.     amantadine (SYMMETREL) 100 MG capsule Take 100 mg by mouth 3 (three) times daily.     Cholecalciferol (DIALYVITE VITAMIN D 5000) 125 MCG (5000 UT) capsule Take 5,000 Units by mouth daily.      citalopram (CELEXA) 40 MG tablet Take 1 tablet by mouth daily.     gabapentin (NEURONTIN) 400 MG capsule Take 1 capsule by mouth 3 (three) times daily.     levothyroxine (SYNTHROID, LEVOTHROID) 25 MCG tablet Take 25 mcg by mouth daily before breakfast.     Melatonin 5 MG CAPS Take 5 mg by mouth at bedtime.     meloxicam (MOBIC) 15 MG tablet Take 15 mg by mouth daily.     Multiple Vitamin (MULTIVITAMIN WITH MINERALS) TABS tablet Take 1 tablet by mouth daily.     omeprazole (PRILOSEC) 20 MG capsule Take 20 mg by mouth daily.     pravastatin (PRAVACHOL) 80 MG tablet Take 1 tablet by mouth every evening.      SUMAtriptan (IMITREX) 100 MG tablet Take 100 mg by mouth every 2 (two) hours as needed for migraine. May repeat in 2 hours if headache persists or recurs.     tiZANidine (ZANAFLEX) 4 MG tablet Take 4 mg by mouth every 8 (eight) hours as needed for muscle spasms.     No current facility-administered medications for this visit.    Allergies   Allergies as of 08/01/2022 - Review Complete 08/01/2022  Allergen Reaction Noted   Acetaminophen-codeine Other (See Comments) 07/24/2013   Diclofenac potassium(migraine) Other (See Comments) 06/27/2013   Sulfonamide derivatives     Tetracycline     Penicillins Rash     Past  Medical History   Past Medical History:  Diagnosis Date   Acid reflux    Asthma    Falls frequently    High cholesterol    Hypothyroidism    Migraines    Pancreatitis, acute     Past Surgical History   Past Surgical History:  Procedure Laterality Date   ABDOMINAL HYSTERECTOMY     Question abdominal   CESAREAN SECTION     FOOT SURGERY     GALLBLADDER SURGERY     TRIGGER FINGER RELEASE Left 12/03/2021   Procedure: RELEASE TRIGGER FINGER/A-1 PULLEY left long finger;  Surgeon: Carole Civil, MD;  Location: [facility];  Service: Orthopedics;  Laterality: Left;    Past Family History   Family History  Problem Relation Age of Onset   Prostate cancer  Brother    Colon cancer Neg Hx     Past Social History   Social History   Socioeconomic History   Marital status: Married    Spouse name: Not on file   Number of children: Not on file   Years of education: Not on file   Highest education level: Not on file  Occupational History   Not on file  Tobacco Use   Smoking status: Never   Smokeless tobacco: Never  Substance and Sexual Activity   Alcohol use: No   Drug use: No   Sexual activity: Yes  Other Topics Concern   Not on file  Social History Narrative   Not on file   Social Determinants of Health   Financial Resource Strain: Not on file  Food Insecurity: Not on file  Transportation Needs: Not on file  Physical Activity: Not on file  Stress: Not on file  Social Connections: Not on file  Intimate Partner Violence: Not on file    Review of Systems   General: Negative for anorexia, weight loss, fever, chills, fatigue, +weakness. Eyes: Negative for vision changes.  ENT: Negative for hoarseness, difficulty swallowing , nasal congestion. See hpi CV: Negative for chest pain, angina, palpitations, dyspnea on exertion, peripheral edema.  Respiratory: Negative for dyspnea at rest, dyspnea on exertion, cough, sputum, wheezing.  GI: See history of present illness. GU:  Negative for dysuria, hematuria, urinary incontinence, urinary frequency, nocturnal urination.  MS: Negative for joint pain, low back pain.  Derm: Negative for rash or itching.  Neuro: Negative for weakness, abnormal sensation, seizure, frequent headaches, memory loss,  confusion. +parkinsons, h/o falls, gait issues. Psych: Negative for anxiety, depression, suicidal ideation, hallucinations.  Endo: Negative for unusual weight change.  Heme: Negative for bruising or bleeding. Allergy: Negative for rash or hives.  Physical Exam   BP (!) 168/98 (BP Location: Right Arm, Patient Position: Sitting, Cuff Size: Large)   Pulse 79   Temp 98.1 F (36.7 C) (Oral)    Ht '5\' 6"'$  (1.676 m)   Wt 192 lb (87.1 kg)   SpO2 97%   BMI 30.99 kg/m    General: appears older than stated age. no acute distress.  Head: Normocephalic, atraumatic.   Eyes: Conjunctiva pink, no icterus. Mouth: Oropharyngeal mucosa moist and pink , no lesions erythema or exudate. Neck: Supple without thyromegaly, masses, or lymphadenopathy.  Lungs: Clear to auscultation bilaterally.  Heart: Regular rate and rhythm, no murmurs rubs or gallops.  Abdomen: Bowel sounds are normal, nontender, nondistended, no hepatosplenomegaly or masses,  no abdominal bruits or hernia, no rebound or guarding.   Rectal: not performed Extremities: No lower extremity edema. No clubbing or deformities.  Neuro: Alert and oriented x 4 , grossly normal neurologically.  Skin: Warm and dry, no rash or jaundice.   Psych: Alert and cooperative, normal mood and affect.  Labs   Labs from April 14, 2022: Glucose 116, BUN 13, creatinine 0.81, albumin 4.6, total bilirubin 0.5, alkaline phosphatase 103, AST 21, ALT 17, white blood cell count 6400, hemoglobin 13.6, platelets 204,000, TSH 2.550.  Imaging Studies   No results found.  Assessment   Constipation: inadequately managed on fiber tablets. Start regular bowel regimen with Amitiza if affordable.   Positive Cologuard: needs colonoscopy for evaluation.   The patient was found to have elevated blood pressure when vital signs were checked in the office. The blood pressure was rechecked by the nursing staff and it was found be persistently elevated >140/90 mmHg. I personally advised to the patient to follow up closely with his PCP for hypertension control.  PLAN   Amitiza 31mg BID with food.  Colonoscopy with Dr. CAbbey Chatters ASA 2.  I have discussed the risks, alternatives, benefits with regards to but not limited to the risk of reaction to medication, bleeding, infection, perforation and the patient is agreeable to proceed. Written consent to be  obtained.    LLaureen Ochs LBobby Rumpf MSidon [facility]

## 2022-08-02 NOTE — Telephone Encounter (Signed)
Pt was made aware and verbalized understanding.  

## 2022-08-04 ENCOUNTER — Telehealth (INDEPENDENT_AMBULATORY_CARE_PROVIDER_SITE_OTHER): Payer: Self-pay | Admitting: *Deleted

## 2022-08-04 NOTE — Telephone Encounter (Signed)
LMOVM to call back to schedule TCS with Dr. Abbey Chatters, ASA 2. She wants 09/2022

## 2022-08-04 NOTE — Telephone Encounter (Signed)
LMOVM to call back. See prior note also

## 2022-08-04 NOTE — Telephone Encounter (Signed)
Patient left vm that she was returning call. May call back on [phone]

## 2022-08-05 MED ORDER — PEG 3350-KCL-NA BICARB-NACL 420 G PO SOLR
4000.0000 mL | Freq: Once | ORAL | 0 refills | Status: AC
Start: 2022-08-05 — End: 2022-08-05

## 2022-08-05 NOTE — Telephone Encounter (Signed)
Spoke with pt. Scheduled for 09/20/22 at [facility] will send instructions. Rx for prep sent to pharmacy. Also states she will not have same insurance next year and not sure which company she is going with. She will let us know as soon as she does.

## 2022-08-15 DIAGNOSIS — Z23 Encounter for immunization: Secondary | ICD-10-CM | POA: Diagnosis not present

## 2022-09-13 ENCOUNTER — Encounter: Payer: Self-pay | Admitting: Neurology

## 2022-09-20 ENCOUNTER — Encounter (HOSPITAL_COMMUNITY)
Admission: RE | Disposition: A | Discharge status: Home / Self Care | Payer: Self-pay | Source: Home / Self Care | Attending: Internal Medicine

## 2022-09-20 ENCOUNTER — Ambulatory Visit (HOSPITAL_COMMUNITY)
Admission: RE | Admit: 2022-09-20 | Discharge: 2022-09-20 | Disposition: A | Discharge status: Home / Self Care | Payer: PPO | Attending: Internal Medicine | Admitting: Internal Medicine

## 2022-09-20 ENCOUNTER — Ambulatory Visit (HOSPITAL_BASED_OUTPATIENT_CLINIC_OR_DEPARTMENT_OTHER): Payer: PPO | Admitting: Anesthesiology

## 2022-09-20 ENCOUNTER — Encounter (HOSPITAL_COMMUNITY): Payer: Self-pay

## 2022-09-20 ENCOUNTER — Ambulatory Visit (HOSPITAL_COMMUNITY): Payer: PPO | Admitting: Anesthesiology

## 2022-09-20 ENCOUNTER — Other Ambulatory Visit: Payer: Self-pay

## 2022-09-20 DIAGNOSIS — K635 Polyp of colon: Secondary | ICD-10-CM | POA: Diagnosis not present

## 2022-09-20 DIAGNOSIS — D122 Benign neoplasm of ascending colon: Secondary | ICD-10-CM | POA: Diagnosis not present

## 2022-09-20 DIAGNOSIS — K5730 Diverticulosis of large intestine without perforation or abscess without bleeding: Secondary | ICD-10-CM

## 2022-09-20 DIAGNOSIS — Z1211 Encounter for screening for malignant neoplasm of colon: Secondary | ICD-10-CM | POA: Insufficient documentation

## 2022-09-20 DIAGNOSIS — R195 Other fecal abnormalities: Secondary | ICD-10-CM | POA: Diagnosis not present

## 2022-09-20 DIAGNOSIS — K649 Unspecified hemorrhoids: Secondary | ICD-10-CM | POA: Diagnosis not present

## 2022-09-20 DIAGNOSIS — F32A Depression, unspecified: Secondary | ICD-10-CM | POA: Diagnosis not present

## 2022-09-20 DIAGNOSIS — D124 Benign neoplasm of descending colon: Secondary | ICD-10-CM | POA: Diagnosis not present

## 2022-09-20 DIAGNOSIS — J45909 Unspecified asthma, uncomplicated: Secondary | ICD-10-CM | POA: Diagnosis not present

## 2022-09-20 DIAGNOSIS — K219 Gastro-esophageal reflux disease without esophagitis: Secondary | ICD-10-CM | POA: Insufficient documentation

## 2022-09-20 DIAGNOSIS — G43909 Migraine, unspecified, not intractable, without status migrainosus: Secondary | ICD-10-CM | POA: Diagnosis not present

## 2022-09-20 DIAGNOSIS — K6389 Other specified diseases of intestine: Secondary | ICD-10-CM | POA: Diagnosis not present

## 2022-09-20 DIAGNOSIS — F419 Anxiety disorder, unspecified: Secondary | ICD-10-CM | POA: Insufficient documentation

## 2022-09-20 DIAGNOSIS — K648 Other hemorrhoids: Secondary | ICD-10-CM | POA: Diagnosis not present

## 2022-09-20 DIAGNOSIS — E039 Hypothyroidism, unspecified: Secondary | ICD-10-CM | POA: Diagnosis not present

## 2022-09-20 HISTORY — PX: COLONOSCOPY WITH PROPOFOL: SHX5780

## 2022-09-20 HISTORY — PX: POLYPECTOMY: SHX5525

## 2022-09-20 SURGERY — COLONOSCOPY WITH PROPOFOL
Anesthesia: General

## 2022-09-20 MED ORDER — MIDAZOLAM HCL 2 MG/2ML IJ SOLN
INTRAMUSCULAR | Status: AC
  Filled 2022-09-20: qty 2

## 2022-09-20 MED ORDER — PROPOFOL 10 MG/ML IV BOLUS
INTRAVENOUS | Status: DC | PRN
  Administered 2022-09-20 (×2): 30 mg via INTRAVENOUS
  Administered 2022-09-20: 100 mg via INTRAVENOUS

## 2022-09-20 MED ORDER — LIDOCAINE HCL (CARDIAC) PF 100 MG/5ML IV SOSY
PREFILLED_SYRINGE | INTRAVENOUS | Status: DC | PRN
  Administered 2022-09-20: 40 mg via INTRAVENOUS

## 2022-09-20 MED ORDER — LACTATED RINGERS IV SOLN
INTRAVENOUS | Status: DC
  Administered 2022-09-20: 1000 mL via INTRAVENOUS

## 2022-09-20 MED ORDER — PROPOFOL 500 MG/50ML IV EMUL
INTRAVENOUS | Status: DC | PRN
  Administered 2022-09-20: 125 ug/kg/min via INTRAVENOUS

## 2022-09-20 MED ORDER — MIDAZOLAM HCL 2 MG/2ML IJ SOLN
2.0000 mg | INTRAMUSCULAR | Status: AC | PRN
  Administered 2022-09-20: 2 mg via INTRAVENOUS

## 2022-09-20 NOTE — Anesthesia Preprocedure Evaluation (Signed)
Anesthesia Evaluation  Patient identified by MRN, date of birth, ID band Patient awake    Reviewed: Allergy & Precautions, H&P , NPO status , Patient's Chart, lab work & pertinent test results, reviewed documented beta blocker date and time   Airway Mallampati: II  TM Distance: >3 FB Neck ROM: full    Dental no notable dental hx.    Pulmonary asthma    Pulmonary exam normal breath sounds clear to auscultation       Cardiovascular Exercise Tolerance: Good negative cardio ROS  Rhythm:regular Rate:Normal     Neuro/Psych  Headaches PSYCHIATRIC DISORDERS Anxiety Depression       GI/Hepatic Neg liver ROS,GERD  Medicated,,  Endo/Other  Hypothyroidism    Renal/GU negative Renal ROS  negative genitourinary   Musculoskeletal   Abdominal   Peds  Hematology negative hematology ROS (+)   Anesthesia Other Findings   Reproductive/Obstetrics negative OB ROS                              Anesthesia Physical Anesthesia Plan  ASA: 2  Anesthesia Plan: General   Post-op Pain Management:    Induction:   PONV Risk Score and Plan: Propofol infusion  Airway Management Planned:   Additional Equipment:   Intra-op Plan:   Post-operative Plan:   Informed Consent: I have reviewed the patients History and Physical, chart, labs and discussed the procedure including the risks, benefits and alternatives for the proposed anesthesia with the patient or authorized representative who has indicated his/her understanding and acceptance.     Dental Advisory Given  Plan Discussed with: CRNA  Anesthesia Plan Comments:          Anesthesia Quick Evaluation

## 2022-09-20 NOTE — Op Note (Signed)
[facility] Patient Name: [name] Procedure Date: 09/20/2022 9:08 AM MRN: [id number] Date of Birth: [birth year] Attending MD: Elon Alas. Abbey Chatters , [place], [id number] CSN: [id number] Age: 67 Admit Type: Outpatient Procedure:                Colonoscopy Indications:              Positive Cologuard test Providers:                Elon Alas. Abbey Chatters, DO, Charlsie Quest. Theda Sers RN, RN,                            Illene Labrador Referring MD:              Medicines:                See the Anesthesia note for documentation of the                            administered medications Complications:            No immediate complications. Estimated Blood Loss:     Estimated blood loss was minimal. Procedure:                Pre-Anesthesia Assessment:                           - The anesthesia plan was to use monitored                            anesthesia care (MAC).                           After obtaining informed consent, the colonoscope                            was passed under direct vision. Throughout the                            procedure, the patient's blood pressure, pulse, and                            oxygen saturations were monitored continuously. The                            [id number]-HQ190L ([id number]) scope was introduced through                            the anus and advanced to the the cecum, identified                            by appendiceal orifice and ileocecal valve. The                            colonoscopy was performed without difficulty. The                            patient tolerated the procedure well. The  quality                            of the bowel preparation was evaluated using the                            BBPS [facility] Bowel Preparation Scale) with scores                            of: Right Colon = 3, Transverse Colon = 3 and Left                            Colon = 3 (entire mucosa seen well with no residual                            staining, small  fragments of stool or opaque                            liquid). The total BBPS score equals 9. Scope In: 9:17:08 AM Scope Out: 9:37:31 AM Scope Withdrawal Time: 0 hours 13 minutes 47 seconds  Total Procedure Duration: 0 hours 20 minutes 23 seconds  Findings:      The perianal and digital rectal examinations were normal.      Non-bleeding internal hemorrhoids were found during endoscopy.      A few small-mouthed diverticula were found in the sigmoid colon.      Two sessile polyps were found in the ascending colon. The polyps were 4       to 8 mm in size. These polyps were removed with a cold snare. Resection       and retrieval were complete.      A 2 mm polyp was found in the descending colon. The polyp was sessile.       The polyp was removed with a cold biopsy forceps. Resection and       retrieval were complete.      The exam was otherwise without abnormality. Impression:               - Non-bleeding internal hemorrhoids.                           - Diverticulosis in the sigmoid colon.                           - Two 4 to 8 mm polyps in the ascending colon,                            removed with a cold snare. Resected and retrieved.                           - One 2 mm polyp in the descending colon, removed                            with a cold biopsy forceps. Resected and retrieved.                           -  The examination was otherwise normal. Moderate Sedation:      Per Anesthesia Care Recommendation:           - Patient has a contact number available for                            emergencies. The signs and symptoms of potential                            delayed complications were discussed with the                            patient. Return to normal activities tomorrow.                            Written discharge instructions were provided to the                            patient.                           - Resume previous diet.                           - Continue  present medications.                           - Await pathology results.                           - Repeat colonoscopy in 5 years for surveillance.                           - Return to GI clinic PRN. Procedure Code(s):        --- Professional ---                           [phone], Colonoscopy, flexible; with removal of                            tumor(s), polyp(s), or other lesion(s) by snare                            technique                           07371, 31, Colonoscopy, flexible; with biopsy,                            single or multiple Diagnosis Code(s):        --- Professional ---                           K64.8, Other hemorrhoids                           D12.2, Benign neoplasm of ascending colon  D12.4, Benign neoplasm of descending colon                           R19.5, Other fecal abnormalities                           K57.30, Diverticulosis of large intestine without                            perforation or abscess without bleeding CPT copyright 2022 American Medical Association. All rights reserved. The codes documented in this report are preliminary and upon coder review may  be revised to meet current compliance requirements. Elon Alas. Abbey Chatters, DO [place] Abbey Chatters, DO 09/20/2022 9:39:38 AM This report has been signed electronically. Number of Addenda: 0

## 2022-09-20 NOTE — Transfer of Care (Signed)
Immediate Anesthesia Transfer of Care Note  Patient: [name]  Procedure(s) Performed: COLONOSCOPY WITH PROPOFOL POLYPECTOMY  Patient Location: Endoscopy Unit  Anesthesia Type:General  Level of Consciousness: awake  Airway & Oxygen Therapy: Patient Spontanous Breathing  Post-op Assessment: Report given to RN and Post -op Vital signs reviewed and stable  Post vital signs: Reviewed and stable  Last Vitals:  Vitals Value Taken Time  BP 100/51 09/20/22 0940  Temp    Pulse 97 09/20/22 0940  Resp 21 09/20/22 0940  SpO2 96 % 09/20/22 0940  Vitals shown include unvalidated device data.  Last Pain:  Vitals:   09/20/22 0912  TempSrc:   PainSc: 0-No pain      Patients Stated Pain Goal: 6 (31/54/00 8676)  Complications: No notable events documented.

## 2022-09-20 NOTE — H&P (Signed)
Primary Care Physician:  Redmond School, MD Primary Gastroenterologist:  Dr. Abbey Chatters  Pre-Procedure History & Physical: HPI:  [name] is a 67 y.o. female is here for a colonoscopy to be performed for positive Cologuard testing.  Past Medical History:  Diagnosis Date   Acid reflux    Asthma    Falls frequently    High cholesterol    Hypothyroidism    Migraines    Pancreatitis, acute 2009   Parkinson's disease     Past Surgical History:  Procedure Laterality Date   ABDOMINAL HYSTERECTOMY     Question abdominal   CESAREAN SECTION     FOOT SURGERY     GALLBLADDER SURGERY     TRIGGER FINGER RELEASE Left 12/03/2021   Procedure: RELEASE TRIGGER FINGER/A-1 PULLEY left long finger;  Surgeon: Carole Civil, MD;  Location: [facility];  Service: Orthopedics;  Laterality: Left;    Prior to Admission medications   Medication Sig Start Date End Date Taking? Authorizing Provider  acetaminophen (TYLENOL) 500 MG tablet Take 500 mg by mouth every 6 (six) hours as needed for moderate pain.   Yes [provider]  albuterol (PROVENTIL HFA;VENTOLIN HFA) 108 (90 Base) MCG/ACT inhaler Inhale 2 puffs into the lungs every 6 (six) hours as needed for wheezing or shortness of breath.   Yes [provider]  ALPRAZolam Duanne Moron) 1 MG tablet Take 1 tablet by mouth 3 (three) times daily as needed for anxiety. 07/21/13  Yes [provider]  amantadine (SYMMETREL) 100 MG capsule Take 100 mg by mouth 3 (three) times daily.   Yes [provider]  Cholecalciferol (DIALYVITE VITAMIN D 5000) 125 MCG (5000 UT) capsule Take 5,000 Units by mouth daily.   Yes [provider]  citalopram (CELEXA) 40 MG tablet Take 40 mg by mouth daily. 07/15/13  Yes [provider]  gabapentin (NEURONTIN) 400 MG capsule Take 400 mg by mouth 3 (three) times daily. 07/13/13  Yes [provider]  levothyroxine (SYNTHROID, LEVOTHROID) 25 MCG tablet Take 25 mcg by mouth  daily before breakfast.   Yes [provider]  Melatonin 5 MG CAPS Take 5 mg by mouth at bedtime.   Yes [provider]  meloxicam (MOBIC) 15 MG tablet Take 15 mg by mouth daily. 06/23/22  Yes [provider]  Multiple Vitamin (MULTIVITAMIN WITH MINERALS) TABS tablet Take 1 tablet by mouth daily.   Yes [provider]  omeprazole (PRILOSEC) 20 MG capsule Take 20 mg by mouth daily.   Yes [provider]  pravastatin (PRAVACHOL) 80 MG tablet Take 80 mg by mouth every evening. 07/18/13  Yes [provider]  SUMAtriptan (IMITREX) 100 MG tablet Take 100 mg by mouth every 2 (two) hours as needed for migraine. May repeat in 2 hours if headache persists or recurs.   Yes [provider]  tiZANidine (ZANAFLEX) 4 MG tablet Take 4 mg by mouth every 8 (eight) hours as needed for muscle spasms. 05/14/21  Yes [provider]    Allergies as of 08/05/2022 - Review Complete 08/01/2022  Allergen Reaction Noted   Acetaminophen-codeine Other (See Comments) 07/24/2013   Diclofenac potassium(migraine) Other (See Comments) 06/27/2013   Sulfonamide derivatives     Tetracycline     Penicillins Rash     Family History  Problem Relation Age of Onset   Prostate cancer Brother    Colon cancer Neg Hx     Social History   Socioeconomic History   Marital status: Married  Spouse name: Not on file   Number of children: Not on file   Years of education: Not on file   Highest education level: Not on file  Occupational History   Not on file  Tobacco Use   Smoking status: Never   Smokeless tobacco: Never  Substance and Sexual Activity   Alcohol use: No   Drug use: No   Sexual activity: Yes  Other Topics Concern   Not on file  Social History Narrative   Not on file   Social Determinants of Health   Financial Resource Strain: Not on file  Food Insecurity: Not on file  Transportation Needs: Not on file  Physical Activity: Not on file   Stress: Not on file  Social Connections: Not on file  Intimate Partner Violence: Not on file    Review of Systems: See HPI, otherwise negative ROS  Physical Exam: Vital signs in last 24 hours: Temp:  [98.3 F (36.8 C)] 98.3 F (36.8 C) (01/16 0746) Pulse Rate:  [111-116] 111 (01/16 0805) Resp:  [19-20] 19 (01/16 0805) BP: (181-215)/(93-151) 198/93 (01/16 0805) SpO2:  [98 %] 98 % (01/16 0752) Weight:  [85.3 kg] 85.3 kg (01/16 0746)   General:   Alert,  Well-developed, well-nourished, pleasant and cooperative in NAD Head:  Normocephalic and atraumatic. Eyes:  Sclera clear, no icterus.   Conjunctiva pink. Ears:  Normal auditory acuity. Nose:  No deformity, discharge,  or lesions. Msk:  Symmetrical without gross deformities. Normal posture. Extremities:  Without clubbing or edema. Neurologic:  Alert and  oriented x4;  grossly normal neurologically. Skin:  Intact without significant lesions or rashes. Psych:  Alert and cooperative. Normal mood and affect.  Impression/Plan: [name] is here for a colonoscopy to be performed for positive Cologuard testing.  The risks of the procedure including infection, bleed, or perforation as well as benefits, limitations, alternatives and imponderables have been reviewed with the patient. Questions have been answered. All parties agreeable.

## 2022-09-20 NOTE — Discharge Instructions (Addendum)
  Colonoscopy Discharge Instructions  Read the instructions outlined below and refer to this sheet in the next few weeks. These discharge instructions provide you with general information on caring for yourself after you leave the hospital. Your doctor may also give you specific instructions. While your treatment has been planned according to the most current medical practices available, unavoidable complications occasionally occur.   ACTIVITY You may resume your regular activity, but move at a slower pace for the next 24 hours.  Take frequent rest periods for the next 24 hours.  Walking will help get rid of the air and reduce the bloated feeling in your belly (abdomen).  No driving for 24 hours (because of the medicine (anesthesia) used during the test).   Do not sign any important legal documents or operate any machinery for 24 hours (because of the anesthesia used during the test).  NUTRITION Drink plenty of fluids.  You may resume your normal diet as instructed by your doctor.  Begin with a light meal and progress to your normal diet. Heavy or fried foods are harder to digest and may make you feel sick to your stomach (nauseated).  Avoid alcoholic beverages for 24 hours or as instructed.  MEDICATIONS You may resume your normal medications unless your doctor tells you otherwise.  WHAT YOU CAN EXPECT TODAY Some feelings of bloating in the abdomen.  Passage of more gas than usual.  Spotting of blood in your stool or on the toilet paper.  IF YOU HAD POLYPS REMOVED DURING THE COLONOSCOPY: No aspirin products for 7 days or as instructed.  No alcohol for 7 days or as instructed.  Eat a soft diet for the next 24 hours.  FINDING OUT THE RESULTS OF YOUR TEST Not all test results are available during your visit. If your test results are not back during the visit, make an appointment with your caregiver to find out the results. Do not assume everything is normal if you have not heard from your  caregiver or the medical facility. It is important for you to follow up on all of your test results.  SEEK IMMEDIATE MEDICAL ATTENTION IF: You have more than a spotting of blood in your stool.  Your belly is swollen (abdominal distention).  You are nauseated or vomiting.  You have a temperature over 101.  You have abdominal pain or discomfort that is severe or gets worse throughout the day.   Your colonoscopy revealed 3 polyp(s) which I removed successfully. Await pathology results, my office will contact you. I recommend repeating colonoscopy in 5 years for surveillance purposes.   You also have diverticulosis and internal hemorrhoids. I would recommend increasing fiber in your diet or adding OTC Benefiber/Metamucil. Be sure to drink at least 4 to 6 glasses of water daily. Follow-up with GI as needed.   I hope you have a great rest of your week!  Elon Alas. Abbey Chatters, D.O. Gastroenterology and Hepatology [facility]

## 2022-09-21 LAB — SURGICAL PATHOLOGY

## 2022-09-21 NOTE — Anesthesia Postprocedure Evaluation (Signed)
Anesthesia Post Note  Patient: CAROL THEYS  Procedure(s) Performed: COLONOSCOPY WITH PROPOFOL POLYPECTOMY  Patient location during evaluation: Phase II Anesthesia Type: General Level of consciousness: awake Pain management: pain level controlled Vital Signs Assessment: post-procedure vital signs reviewed and stable Respiratory status: spontaneous breathing and respiratory function stable Cardiovascular status: blood pressure returned to baseline and stable Postop Assessment: no headache and no apparent nausea or vomiting Anesthetic complications: no Comments: Late entry   No notable events documented.   Last Vitals:  Vitals:   09/20/22 0942 09/20/22 0945  BP: 129/65   Pulse: 99 98  Resp: (!) 21 19  Temp:    SpO2: 97% 95%    Last Pain:  Vitals:   09/20/22 0942  TempSrc:   PainSc: 0-No pain                 Louann Sjogren

## 2022-09-26 ENCOUNTER — Encounter (HOSPITAL_COMMUNITY): Payer: Self-pay | Admitting: Internal Medicine

## 2022-09-27 DIAGNOSIS — M1991 Primary osteoarthritis, unspecified site: Secondary | ICD-10-CM | POA: Diagnosis not present

## 2022-09-27 DIAGNOSIS — F321 Major depressive disorder, single episode, moderate: Secondary | ICD-10-CM | POA: Diagnosis not present

## 2022-09-27 DIAGNOSIS — I5022 Chronic systolic (congestive) heart failure: Secondary | ICD-10-CM | POA: Diagnosis not present

## 2022-09-27 DIAGNOSIS — J0100 Acute maxillary sinusitis, unspecified: Secondary | ICD-10-CM | POA: Diagnosis not present

## 2022-10-03 ENCOUNTER — Ambulatory Visit: Payer: Self-pay | Admitting: Neurology

## 2022-10-12 ENCOUNTER — Ambulatory Visit: Payer: Self-pay | Admitting: Neurology

## 2022-10-20 DIAGNOSIS — U071 COVID-19: Secondary | ICD-10-CM | POA: Diagnosis not present

## 2022-10-20 DIAGNOSIS — R030 Elevated blood-pressure reading, without diagnosis of hypertension: Secondary | ICD-10-CM | POA: Diagnosis not present

## 2022-10-27 NOTE — Progress Notes (Signed)
Assessment/Plan:    Tardive dyskinesia -The patients symptoms are most consistent with tardive dyskinesia, likely due to prior Abilify use.  TD is a heterogeneous syndrome depending on a subtle balance between several neurotransmitters in the brain, including DA receptor blockade and hypersensitivity of DA and GABA receptors.  Patient has been off Abilify for a number of years.  Discussed with her that the tongue movements are likely permanent.  It is actually not bothersome to her and we decided not to add more medication.  She also could have a component of edentulous dyskinesia.  As she lost her dentures some years ago.  2.  History of parkinsonism  -This apparently markedly improved when off of Abilify.  I do not see any evidence of parkinsonism today.  I did offer them a DaTscan, but they declined that.  -We discussed her Abilify.  She is currently on amantadine, 100 mg twice per day.  They thought she got markedly better in terms of balance when she went on it, but my guess is that it was the parkinsonism that got better and it was just that she was put on the amantadine at the same time.  We talked about potentially starting to wean it given that it can cause confusion and they were agreeable.  We decided to just go to once a day in the morning until next visit and then we can stop it if she is doing about the same.  3.  Gait instability  -Patient most certainly has gait instability, which may be multifactorial.  However, I did discuss with her that the Xanax is likely a strong contributor to this.  She is on Xanax, 1 mg 3 times per day.  4.  Memory change  -could be due to meds (xanax, gabapentin) and could also have component of neurodegenerative, although patient and husband do not seem to think that memory is a big issue.  Patient does plan on getting off of the gabapentin as she does not think it has been terrifically helpful (was on it after a tarsal tunnel surgery).  Subjective:    [name] was seen today in the movement disorders clinic for neurologic consultation at the request of Phillips Odor, MD.  Pt with husband who supplements the history.  Medical records made available to me are reviewed.  Patient was last seen by Dr. Merlene Laughter April 05, 2022.  Notes indicate that patient was thought to have neuroleptic/Abilify induced parkinsonism as well as tardive dyskinesia, but patient was apparently taken off of neuroleptics and symptoms apparently improved, but still had some persistent parkinsonism.  She was treated with amantadine.  It appears that she had orofacial dyskinesia from the Abilify as well.  Abilify was discontinued sometime in 2020.   Specific Symptoms:  Tremor: used to shake in the L hand but now its a "whole lot better."  Pt states that she shakes some now but not sure if related to her nerves Voice: lower/more raspy than in the past Sleep:   Vivid Dreams:  Yes.    Acting out dreams:  last fall out of bed was few years ago (they took the frame off of the bed to lower it) Wet Pillows: a little Postural symptoms:  Yes.    Falls?  No. Bradykinesia symptoms: balance is better with amantadine; she will have retropulsion if she walks for a long period of time without the walker but does well with the walker; husband thinks that she shuffles - she  says she does drag the L leg a bit Loss of smell:  No. Loss of taste:  No. Urinary Incontinence:  just nighttime, she will wear depends and doesn't often use it even then Difficulty Swallowing:  Yes.  , with meats Handwriting, micrographia: No. Trouble with ADL's:  No.  Trouble buttoning clothing: No. Depression:  usually pretty good mood but occ depressed Memory changes:  "pretty decent"; does not drive but never has; husband does finances but always has; does own pill box and takes medication without trouble Hallucinations:  No.  visual distortions: No. N/V:  only nausea with a migraine (has migraine 1  time per month) Lightheaded:  only with a headache  Syncope: No. Diplopia:  No. Dyskinesia:  No. Prior exposure to reglan/antipsychotics: Yes.  , abilify - off since 2020   She had an MRI brain in December, 2021 that demonstrated mild small vessel disease.   ALLERGIES:   Allergies  Allergen Reactions   Acetaminophen-Codeine Other (See Comments)    Made patient talk out of her head and have slurred speech.   Diclofenac Potassium(Migraine) Other (See Comments)    cramps   Sulfonamide Derivatives     Blisters    Tetracycline     unknown reaction   Penicillins Rash    CURRENT MEDICATIONS:  Current Meds  Medication Sig   acetaminophen (TYLENOL) 500 MG tablet Take 500 mg by mouth every 6 (six) hours as needed for moderate pain.   albuterol (PROVENTIL HFA;VENTOLIN HFA) 108 (90 Base) MCG/ACT inhaler Inhale 2 puffs into the lungs every 6 (six) hours as needed for wheezing or shortness of breath.   ALPRAZolam (XANAX) 1 MG tablet Take 1 tablet by mouth 3 (three) times daily as needed for anxiety.   amantadine (SYMMETREL) 100 MG capsule Take 100 mg by mouth 3 (three) times daily.   Cholecalciferol (DIALYVITE VITAMIN D 5000) 125 MCG (5000 UT) capsule Take 5,000 Units by mouth daily.   citalopram (CELEXA) 40 MG tablet Take 40 mg by mouth daily.   gabapentin (NEURONTIN) 400 MG capsule Take 400 mg by mouth 3 (three) times daily.   levothyroxine (SYNTHROID, LEVOTHROID) 25 MCG tablet Take 25 mcg by mouth daily before breakfast.   Melatonin 5 MG CAPS Take 5 mg by mouth at bedtime.   meloxicam (MOBIC) 15 MG tablet Take 15 mg by mouth daily.   Multiple Vitamin (MULTIVITAMIN WITH MINERALS) TABS tablet Take 1 tablet by mouth daily.   omeprazole (PRILOSEC) 20 MG capsule Take 20 mg by mouth daily.   pravastatin (PRAVACHOL) 80 MG tablet Take 80 mg by mouth every evening.   SUMAtriptan (IMITREX) 100 MG tablet Take 100 mg by mouth every 2 (two) hours as needed for migraine. May repeat in 2 hours if  headache persists or recurs.   tiZANidine (ZANAFLEX) 4 MG tablet Take 4 mg by mouth at bedtime.     Objective:   VITALS:   Vitals:   10/31/22 0856  BP: (!) 146/83  Pulse: 77  SpO2: 96%  Weight: 189 lb (85.7 kg)  Height: '5\' 6"'$  (1.676 m)    GEN:  The patient appears stated age and is in NAD. HEENT:  Normocephalic, atraumatic.  The mucous membranes are moist. The superficial temporal arteries are without ropiness or tenderness. CV:  RRR Lungs:  CTAB Neck/HEME:  There are no carotid bruits bilaterally.  Neurological examination:  Orientation: The patient is alert and oriented x3.  Cranial nerves: There is good facial symmetry. Extraocular muscles are intact. The  visual fields are full to confrontational testing. The speech is fluent and clear. Soft palate rises symmetrically and there is no tongue deviation. Hearing is intact to conversational tone. Sensation: Sensation is intact to light and pinprick throughout (facial, trunk, extremities). Vibration is intact at the bilateral big toe. There is no extinction with double simultaneous stimulation. There is no sensory dermatomal level identified. Motor: Strength is 5/5 in the bilateral upper and lower extremities.   Shoulder shrug is equal and symmetric.  There is no pronator drift. Deep tendon reflexes: Deep tendon reflexes are 2-/4 at the bilateral biceps, triceps, brachioradialis, patella and achilles. Plantar responses are downgoing bilaterally.  Movement examination: Tone: There is nl tone in the bilateral upper extremities.  The tone in the lower extremities is nl.  Abnormal movements: none Coordination:  There is  decremation with RAM's, only with foot taps on the L Gait and Station: The patient has no difficulty arising out of a deep-seated chair without the use of the hands. The patient has a wide based, short stepped gait.  Knees are flexed and she is just a bit extended back.   I have reviewed and interpreted the following  labs independently   Chemistry      Component Value Date/Time   NA 139 12/01/2021 1102   K 3.5 12/01/2021 1102   CL 107 12/01/2021 1102   CO2 27 12/01/2021 1102   BUN 11 12/01/2021 1102   CREATININE 0.82 12/01/2021 1102      Component Value Date/Time   CALCIUM 9.2 12/01/2021 1102   ALKPHOS 99 04/03/2018 1610   AST 27 04/03/2018 1610   ALT 16 04/03/2018 1610   BILITOT 0.8 04/03/2018 1610       Lab Results  Component Value Date   WBC 7.0 12/01/2021   HGB 13.7 12/01/2021   HCT 43.5 12/01/2021   MCV 92.6 12/01/2021   PLT 193 12/01/2021     Total time spent on today's visit was 45 minutes, including both face-to-face time and nonface-to-face time.  Time included that spent on review of records (prior notes available to me/labs/imaging if pertinent), discussing treatment and goals, answering patient's questions and coordinating care.  Cc:  Redmond School, MD

## 2022-10-31 ENCOUNTER — Encounter: Payer: Self-pay | Admitting: Neurology

## 2022-10-31 ENCOUNTER — Ambulatory Visit: Payer: Self-pay | Admitting: Neurology

## 2022-10-31 ENCOUNTER — Ambulatory Visit (INDEPENDENT_AMBULATORY_CARE_PROVIDER_SITE_OTHER): Payer: HMO | Admitting: Neurology

## 2022-10-31 VITALS — BP 146/83 | HR 77 | Ht 66.0 in | Wt 189.0 lb

## 2022-10-31 DIAGNOSIS — G2401 Drug induced subacute dyskinesia: Secondary | ICD-10-CM | POA: Diagnosis not present

## 2022-10-31 DIAGNOSIS — R2681 Unsteadiness on feet: Secondary | ICD-10-CM | POA: Diagnosis not present

## 2022-10-31 DIAGNOSIS — R413 Other amnesia: Secondary | ICD-10-CM | POA: Diagnosis not present

## 2022-10-31 MED ORDER — AMANTADINE HCL 100 MG PO CAPS: 100.0000 mg | ORAL_CAPSULE | Freq: Every day | ORAL | 1 refills | Status: AC

## 2022-10-31 NOTE — Patient Instructions (Addendum)
Decrease amantadine to ONCE per day in the AM Let me know if you want a referral to Physical therapy for the balance We decided to hold off on the DaT scan  The physicians and staff at [facility] are committed to providing excellent care. You may receive a survey requesting feedback about your experience at our office. We strive to receive "very good" responses to the survey questions. If you feel that your experience would prevent you from giving the office a "very good " response, please contact our office to try to remedy the situation. We may be reached at [phone]. Thank you for taking the time out of your busy day to complete the survey.

## 2022-12-02 DIAGNOSIS — Z1231 Encounter for screening mammogram for malignant neoplasm of breast: Secondary | ICD-10-CM | POA: Diagnosis not present

## 2022-12-09 DIAGNOSIS — E118 Type 2 diabetes mellitus with unspecified complications: Secondary | ICD-10-CM | POA: Diagnosis not present

## 2022-12-09 DIAGNOSIS — I5022 Chronic systolic (congestive) heart failure: Secondary | ICD-10-CM | POA: Diagnosis not present

## 2022-12-09 DIAGNOSIS — F419 Anxiety disorder, unspecified: Secondary | ICD-10-CM | POA: Diagnosis not present

## 2022-12-09 DIAGNOSIS — F321 Major depressive disorder, single episode, moderate: Secondary | ICD-10-CM | POA: Diagnosis not present

## 2022-12-09 DIAGNOSIS — E6609 Other obesity due to excess calories: Secondary | ICD-10-CM | POA: Diagnosis not present

## 2022-12-09 DIAGNOSIS — Z6830 Body mass index (BMI) 30.0-30.9, adult: Secondary | ICD-10-CM | POA: Diagnosis not present

## 2022-12-09 DIAGNOSIS — M1991 Primary osteoarthritis, unspecified site: Secondary | ICD-10-CM | POA: Diagnosis not present

## 2022-12-09 DIAGNOSIS — G2111 Neuroleptic induced parkinsonism: Secondary | ICD-10-CM | POA: Diagnosis not present

## 2023-02-10 DIAGNOSIS — M1991 Primary osteoarthritis, unspecified site: Secondary | ICD-10-CM | POA: Diagnosis not present

## 2023-02-10 DIAGNOSIS — F321 Major depressive disorder, single episode, moderate: Secondary | ICD-10-CM | POA: Diagnosis not present

## 2023-02-10 DIAGNOSIS — E663 Overweight: Secondary | ICD-10-CM | POA: Diagnosis not present

## 2023-02-10 DIAGNOSIS — G2111 Neuroleptic induced parkinsonism: Secondary | ICD-10-CM | POA: Diagnosis not present

## 2023-02-10 DIAGNOSIS — G8929 Other chronic pain: Secondary | ICD-10-CM | POA: Diagnosis not present

## 2023-02-10 DIAGNOSIS — Z6829 Body mass index (BMI) 29.0-29.9, adult: Secondary | ICD-10-CM | POA: Diagnosis not present

## 2023-02-10 DIAGNOSIS — I5022 Chronic systolic (congestive) heart failure: Secondary | ICD-10-CM | POA: Diagnosis not present

## 2023-03-03 DIAGNOSIS — I10 Essential (primary) hypertension: Secondary | ICD-10-CM | POA: Diagnosis not present

## 2023-03-03 DIAGNOSIS — W1839XA Other fall on same level, initial encounter: Secondary | ICD-10-CM | POA: Diagnosis not present

## 2023-03-03 DIAGNOSIS — S0011XA Contusion of right eyelid and periocular area, initial encounter: Secondary | ICD-10-CM | POA: Diagnosis not present

## 2023-03-03 DIAGNOSIS — S01111A Laceration without foreign body of right eyelid and periocular area, initial encounter: Secondary | ICD-10-CM | POA: Diagnosis not present

## 2023-03-17 ENCOUNTER — Encounter: Payer: Self-pay | Admitting: Neurology
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
## 2023-04-14 ENCOUNTER — Encounter: Payer: Self-pay | Admitting: Neurology

## 2023-04-14 ENCOUNTER — Ambulatory Visit (INDEPENDENT_AMBULATORY_CARE_PROVIDER_SITE_OTHER): Payer: HMO | Admitting: Neurology

## 2023-04-14 VITALS — BP 150/80 | HR 66 | Ht 66.0 in | Wt 184.0 lb

## 2023-04-14 DIAGNOSIS — G2401 Drug induced subacute dyskinesia: Secondary | ICD-10-CM | POA: Diagnosis not present

## 2023-04-14 DIAGNOSIS — R2681 Unsteadiness on feet: Secondary | ICD-10-CM | POA: Diagnosis not present

## 2023-04-14 NOTE — Patient Instructions (Signed)
It was good to see you today!   We discussed that you don't have Parkinsons Disease.  You do have tardive dyskinesia in the mouth from your old abilify.  Use your walker or cane.  The physicians and staff at [facility] are committed to providing excellent care. You may receive a survey requesting feedback about your experience at our office. We strive to receive "very good" responses to the survey questions. If you feel that your experience would prevent you from giving the office a "very good " response, please contact our office to try to remedy the situation. We may be reached at [phone]. Thank you for taking the time out of your busy day to complete the survey.

## 2023-04-21 DIAGNOSIS — H6982 Other specified disorders of Eustachian tube, left ear: Secondary | ICD-10-CM | POA: Diagnosis not present

## 2023-04-21 DIAGNOSIS — Z6829 Body mass index (BMI) 29.0-29.9, adult: Secondary | ICD-10-CM | POA: Diagnosis not present

## 2023-04-21 DIAGNOSIS — E538 Deficiency of other specified B group vitamins: Secondary | ICD-10-CM | POA: Diagnosis not present

## 2023-04-21 DIAGNOSIS — Z0001 Encounter for general adult medical examination with abnormal findings: Secondary | ICD-10-CM | POA: Diagnosis not present

## 2023-04-21 DIAGNOSIS — M1991 Primary osteoarthritis, unspecified site: Secondary | ICD-10-CM | POA: Diagnosis not present

## 2023-04-21 DIAGNOSIS — Z1331 Encounter for screening for depression: Secondary | ICD-10-CM | POA: Diagnosis not present

## 2023-04-21 DIAGNOSIS — E1159 Type 2 diabetes mellitus with other circulatory complications: Secondary | ICD-10-CM | POA: Diagnosis not present

## 2023-04-21 DIAGNOSIS — F321 Major depressive disorder, single episode, moderate: Secondary | ICD-10-CM | POA: Diagnosis not present

## 2023-04-21 DIAGNOSIS — G8929 Other chronic pain: Secondary | ICD-10-CM | POA: Diagnosis not present

## 2023-04-21 DIAGNOSIS — E039 Hypothyroidism, unspecified: Secondary | ICD-10-CM | POA: Diagnosis not present

## 2023-04-21 DIAGNOSIS — E6609 Other obesity due to excess calories: Secondary | ICD-10-CM | POA: Diagnosis not present

## 2023-04-21 DIAGNOSIS — I5022 Chronic systolic (congestive) heart failure: Secondary | ICD-10-CM | POA: Diagnosis not present

## 2023-04-21 DIAGNOSIS — E118 Type 2 diabetes mellitus with unspecified complications: Secondary | ICD-10-CM | POA: Diagnosis not present

## 2023-04-21 DIAGNOSIS — E559 Vitamin D deficiency, unspecified: Secondary | ICD-10-CM | POA: Diagnosis not present

## 2023-04-24 ENCOUNTER — Ambulatory Visit: Payer: HMO | Admitting: Neurology

## 2023-06-09 DIAGNOSIS — J329 Chronic sinusitis, unspecified: Secondary | ICD-10-CM | POA: Diagnosis not present

## 2023-06-09 DIAGNOSIS — J3089 Other allergic rhinitis: Secondary | ICD-10-CM | POA: Diagnosis not present

## 2023-06-09 DIAGNOSIS — F419 Anxiety disorder, unspecified: Secondary | ICD-10-CM | POA: Diagnosis not present

## 2023-06-09 DIAGNOSIS — E6609 Other obesity due to excess calories: Secondary | ICD-10-CM | POA: Diagnosis not present

## 2023-06-09 DIAGNOSIS — Z6829 Body mass index (BMI) 29.0-29.9, adult: Secondary | ICD-10-CM | POA: Diagnosis not present

## 2023-06-09 DIAGNOSIS — J069 Acute upper respiratory infection, unspecified: Secondary | ICD-10-CM | POA: Diagnosis not present

## 2023-07-13 DIAGNOSIS — E6609 Other obesity due to excess calories: Secondary | ICD-10-CM | POA: Diagnosis not present

## 2023-07-13 DIAGNOSIS — M1711 Unilateral primary osteoarthritis, right knee: Secondary | ICD-10-CM | POA: Diagnosis not present

## 2023-07-13 DIAGNOSIS — E1159 Type 2 diabetes mellitus with other circulatory complications: Secondary | ICD-10-CM | POA: Diagnosis not present

## 2023-07-13 DIAGNOSIS — Z6830 Body mass index (BMI) 30.0-30.9, adult: Secondary | ICD-10-CM | POA: Diagnosis not present

## 2023-07-13 DIAGNOSIS — I10 Essential (primary) hypertension: Secondary | ICD-10-CM | POA: Diagnosis not present

## 2023-07-29 DIAGNOSIS — E669 Obesity, unspecified: Secondary | ICD-10-CM | POA: Diagnosis not present

## 2023-07-29 DIAGNOSIS — T783XXA Angioneurotic edema, initial encounter: Secondary | ICD-10-CM | POA: Diagnosis not present

## 2023-07-29 DIAGNOSIS — Z6832 Body mass index (BMI) 32.0-32.9, adult: Secondary | ICD-10-CM | POA: Diagnosis not present

## 2023-07-29 DIAGNOSIS — I10 Essential (primary) hypertension: Secondary | ICD-10-CM | POA: Diagnosis not present

## 2023-08-07 ENCOUNTER — Ambulatory Visit: Payer: HMO | Admitting: Orthopedic Surgery

## 2023-08-17 DIAGNOSIS — Z6830 Body mass index (BMI) 30.0-30.9, adult: Secondary | ICD-10-CM | POA: Diagnosis not present

## 2023-08-17 DIAGNOSIS — E6609 Other obesity due to excess calories: Secondary | ICD-10-CM | POA: Diagnosis not present

## 2023-08-17 DIAGNOSIS — T464X5D Adverse effect of angiotensin-converting-enzyme inhibitors, subsequent encounter: Secondary | ICD-10-CM | POA: Diagnosis not present

## 2023-08-17 DIAGNOSIS — F321 Major depressive disorder, single episode, moderate: Secondary | ICD-10-CM | POA: Diagnosis not present

## 2023-08-17 DIAGNOSIS — I10 Essential (primary) hypertension: Secondary | ICD-10-CM | POA: Diagnosis not present

## 2023-08-23 DIAGNOSIS — R059 Cough, unspecified: Secondary | ICD-10-CM | POA: Diagnosis not present

## 2023-08-23 DIAGNOSIS — J22 Unspecified acute lower respiratory infection: Secondary | ICD-10-CM | POA: Diagnosis not present

## 2023-08-25 ENCOUNTER — Encounter: Payer: Self-pay | Admitting: Orthopedic Surgery

## 2023-08-25 ENCOUNTER — Other Ambulatory Visit (INDEPENDENT_AMBULATORY_CARE_PROVIDER_SITE_OTHER): Payer: HMO

## 2023-08-25 ENCOUNTER — Ambulatory Visit: Payer: HMO | Admitting: Orthopedic Surgery

## 2023-08-25 VITALS — BP 185/101 | HR 105 | Ht 66.0 in | Wt 184.0 lb

## 2023-08-25 DIAGNOSIS — M1711 Unilateral primary osteoarthritis, right knee: Secondary | ICD-10-CM

## 2023-08-25 DIAGNOSIS — G8929 Other chronic pain: Secondary | ICD-10-CM

## 2023-08-25 NOTE — Progress Notes (Signed)
Chief Complaint  Patient presents with   Knee Pain    Right    HPI 67 year old female presents for evaluation and management of right knee pain.  Patient reports knee pain over the medial side of the joint  Also complains of some swelling and loss of motion  Currently on Celebrex twice a day and Tylenol arthritis  PE BP (!) 185/101   Pulse (!) 105   Ht 5\' 6"  (1.676 m)   Wt 184 lb (83.5 kg)   BMI 29.70 kg/m   Right knee noticeably not coming into full extension when she is walking  The medial joint line is tender she has a small joint effusion  Lack of full extension but flexion up to the level of 125 degrees  Meniscal signs negative  Ligament test normal  Imaging:  X-rays of the knee show narrowing of the medial compartment with arthritis in all 3 compartments with secondary bone changes including osteophyte formation  Recommend continue Tylenol arthritis, Celebrex twice a day, injection right knee joint  Follow-up in 6 weeks to reassess possible need for knee replacement  Procedure note right knee injection   verbal consent was obtained to inject right knee joint  Timeout was completed to confirm the site of injection  The medications used were depomedrol 40 mg and 1% lidocaine 3 cc Anesthesia was provided by ethyl chloride and the skin was prepped with alcohol.  After cleaning the skin with alcohol a 20-gauge needle was used to inject the right knee joint. There were no complications. A sterile bandage was applied.

## 2023-08-25 NOTE — Patient Instructions (Addendum)
TAKE TYLENOL ARTHRITIS 1 EVERY 6 HRS   USE BENGAY OR ASPERCREME 3 X A DAY   KNEE EXERCISES

## 2023-10-03 DIAGNOSIS — E870 Hyperosmolality and hypernatremia: Secondary | ICD-10-CM | POA: Diagnosis not present

## 2023-10-03 DIAGNOSIS — I10 Essential (primary) hypertension: Secondary | ICD-10-CM | POA: Diagnosis not present

## 2023-12-08 DIAGNOSIS — F419 Anxiety disorder, unspecified: Secondary | ICD-10-CM | POA: Diagnosis not present

## 2023-12-08 DIAGNOSIS — I10 Essential (primary) hypertension: Secondary | ICD-10-CM | POA: Diagnosis not present

## 2023-12-08 DIAGNOSIS — Z6827 Body mass index (BMI) 27.0-27.9, adult: Secondary | ICD-10-CM | POA: Diagnosis not present

## 2023-12-08 DIAGNOSIS — E1159 Type 2 diabetes mellitus with other circulatory complications: Secondary | ICD-10-CM | POA: Diagnosis not present

## 2023-12-08 DIAGNOSIS — E663 Overweight: Secondary | ICD-10-CM | POA: Diagnosis not present

## 2024-01-23 DIAGNOSIS — R1030 Lower abdominal pain, unspecified: Secondary | ICD-10-CM | POA: Diagnosis not present

## 2024-01-23 DIAGNOSIS — N390 Urinary tract infection, site not specified: Secondary | ICD-10-CM | POA: Diagnosis not present

## 2024-01-23 DIAGNOSIS — Z6827 Body mass index (BMI) 27.0-27.9, adult: Secondary | ICD-10-CM | POA: Diagnosis not present

## 2024-01-23 DIAGNOSIS — E663 Overweight: Secondary | ICD-10-CM | POA: Diagnosis not present

## 2024-04-23 DIAGNOSIS — Z1231 Encounter for screening mammogram for malignant neoplasm of breast: Secondary | ICD-10-CM | POA: Diagnosis not present

## 2024-05-16 DIAGNOSIS — F419 Anxiety disorder, unspecified: Secondary | ICD-10-CM | POA: Diagnosis not present

## 2024-05-16 DIAGNOSIS — E782 Mixed hyperlipidemia: Secondary | ICD-10-CM | POA: Diagnosis not present

## 2024-05-16 DIAGNOSIS — Z1331 Encounter for screening for depression: Secondary | ICD-10-CM | POA: Diagnosis not present

## 2024-05-16 DIAGNOSIS — M1711 Unilateral primary osteoarthritis, right knee: Secondary | ICD-10-CM | POA: Diagnosis not present

## 2024-05-16 DIAGNOSIS — Z6827 Body mass index (BMI) 27.0-27.9, adult: Secondary | ICD-10-CM | POA: Diagnosis not present

## 2024-05-16 DIAGNOSIS — I10 Essential (primary) hypertension: Secondary | ICD-10-CM | POA: Diagnosis not present

## 2024-05-16 DIAGNOSIS — E1159 Type 2 diabetes mellitus with other circulatory complications: Secondary | ICD-10-CM | POA: Diagnosis not present

## 2024-05-16 DIAGNOSIS — Z0001 Encounter for general adult medical examination with abnormal findings: Secondary | ICD-10-CM | POA: Diagnosis not present

## 2024-05-16 DIAGNOSIS — F321 Major depressive disorder, single episode, moderate: Secondary | ICD-10-CM | POA: Diagnosis not present

## 2024-05-16 DIAGNOSIS — E039 Hypothyroidism, unspecified: Secondary | ICD-10-CM | POA: Diagnosis not present

## 2024-05-31 DIAGNOSIS — M25561 Pain in right knee: Secondary | ICD-10-CM | POA: Diagnosis not present

## 2024-05-31 DIAGNOSIS — M25461 Effusion, right knee: Secondary | ICD-10-CM | POA: Diagnosis not present

## 2024-06-14 DIAGNOSIS — M25461 Effusion, right knee: Secondary | ICD-10-CM | POA: Diagnosis not present

## 2024-06-21 DIAGNOSIS — M25461 Effusion, right knee: Secondary | ICD-10-CM | POA: Diagnosis not present

## 2024-06-24 DIAGNOSIS — M25461 Effusion, right knee: Secondary | ICD-10-CM | POA: Diagnosis not present

## 2024-06-28 DIAGNOSIS — M25461 Effusion, right knee: Secondary | ICD-10-CM | POA: Diagnosis not present

## 2024-07-01 DIAGNOSIS — M25461 Effusion, right knee: Secondary | ICD-10-CM | POA: Diagnosis not present

## 2024-07-05 DIAGNOSIS — M25461 Effusion, right knee: Secondary | ICD-10-CM | POA: Diagnosis not present

## 2024-07-09 DIAGNOSIS — M25461 Effusion, right knee: Secondary | ICD-10-CM | POA: Diagnosis not present

## 2024-07-12 DIAGNOSIS — M25461 Effusion, right knee: Secondary | ICD-10-CM | POA: Diagnosis not present

## 2024-07-15 DIAGNOSIS — M25461 Effusion, right knee: Secondary | ICD-10-CM | POA: Diagnosis not present

## 2024-07-19 DIAGNOSIS — M25461 Effusion, right knee: Secondary | ICD-10-CM | POA: Diagnosis not present

## 2024-07-22 DIAGNOSIS — M25461 Effusion, right knee: Secondary | ICD-10-CM | POA: Diagnosis not present

## 2024-07-26 DIAGNOSIS — M25461 Effusion, right knee: Secondary | ICD-10-CM | POA: Diagnosis not present

## 2024-08-12 DIAGNOSIS — M25461 Effusion, right knee: Secondary | ICD-10-CM | POA: Diagnosis not present

## 2024-08-13 DIAGNOSIS — E782 Mixed hyperlipidemia: Secondary | ICD-10-CM | POA: Diagnosis not present

## 2024-08-13 DIAGNOSIS — G43709 Chronic migraine without aura, not intractable, without status migrainosus: Secondary | ICD-10-CM | POA: Diagnosis not present

## 2024-08-13 DIAGNOSIS — M25511 Pain in right shoulder: Secondary | ICD-10-CM | POA: Diagnosis not present

## 2024-08-13 DIAGNOSIS — Z6828 Body mass index (BMI) 28.0-28.9, adult: Secondary | ICD-10-CM | POA: Diagnosis not present

## 2024-08-13 DIAGNOSIS — G8929 Other chronic pain: Secondary | ICD-10-CM | POA: Diagnosis not present

## 2024-08-13 DIAGNOSIS — M5459 Other low back pain: Secondary | ICD-10-CM | POA: Diagnosis not present

## 2024-08-13 DIAGNOSIS — F330 Major depressive disorder, recurrent, mild: Secondary | ICD-10-CM | POA: Diagnosis not present

## 2024-08-13 DIAGNOSIS — M170 Bilateral primary osteoarthritis of knee: Secondary | ICD-10-CM | POA: Diagnosis not present

## 2024-08-13 DIAGNOSIS — F419 Anxiety disorder, unspecified: Secondary | ICD-10-CM | POA: Diagnosis not present

## 2024-08-13 DIAGNOSIS — E1169 Type 2 diabetes mellitus with other specified complication: Secondary | ICD-10-CM | POA: Diagnosis not present

## 2024-08-13 DIAGNOSIS — E663 Overweight: Secondary | ICD-10-CM | POA: Diagnosis not present

## 2024-08-16 DIAGNOSIS — M25461 Effusion, right knee: Secondary | ICD-10-CM | POA: Diagnosis not present
